# Patient Record
Sex: Male | Born: 1935 | Race: White | Hispanic: No | Marital: Single | Smoking: Never smoker
Health system: Southern US, Community
[De-identification: ages and names within clinical notes are randomized; demographics above are authoritative.]

## PROBLEM LIST (undated history)

## (undated) DIAGNOSIS — E039 Hypothyroidism, unspecified: Secondary | ICD-10-CM

## (undated) DIAGNOSIS — N400 Enlarged prostate without lower urinary tract symptoms: Secondary | ICD-10-CM

## (undated) DIAGNOSIS — G629 Polyneuropathy, unspecified: Secondary | ICD-10-CM

## (undated) DIAGNOSIS — J449 Chronic obstructive pulmonary disease, unspecified: Secondary | ICD-10-CM

## (undated) DIAGNOSIS — K449 Diaphragmatic hernia without obstruction or gangrene: Secondary | ICD-10-CM

## (undated) DIAGNOSIS — M1990 Unspecified osteoarthritis, unspecified site: Secondary | ICD-10-CM

## (undated) DIAGNOSIS — I509 Heart failure, unspecified: Secondary | ICD-10-CM

## (undated) DIAGNOSIS — R0602 Shortness of breath: Secondary | ICD-10-CM

## (undated) DIAGNOSIS — I10 Essential (primary) hypertension: Secondary | ICD-10-CM

## (undated) HISTORY — PX: REPAIR KNEE LIGAMENT: SUR1188

---

## 2010-05-24 ENCOUNTER — Observation Stay (HOSPITAL_COMMUNITY): Admission: EM | Admit: 2010-05-24 | Discharge: 2010-05-25 | Payer: Self-pay | Source: Home / Self Care

## 2010-06-03 LAB — CBC
HCT: 40.9 % (ref 39.0–52.0)
Hemoglobin: 12.7 g/dL — ABNORMAL LOW (ref 13.0–17.0)
MCH: 29.4 pg (ref 26.0–34.0)
MCHC: 31.1 g/dL (ref 30.0–36.0)
MCV: 94.7 fL (ref 78.0–100.0)
Platelets: 150 10*3/uL (ref 150–400)
RBC: 4.32 MIL/uL (ref 4.22–5.81)
RDW: 13 % (ref 11.5–15.5)
WBC: 10 10*3/uL (ref 4.0–10.5)

## 2010-06-03 LAB — PROTIME-INR
INR: 1.09 (ref 0.00–1.49)
Prothrombin Time: 14.3 seconds (ref 11.6–15.2)

## 2010-06-03 LAB — DIFFERENTIAL
Basophils Absolute: 0 10*3/uL (ref 0.0–0.1)
Basophils Relative: 0 % (ref 0–1)
Eosinophils Absolute: 0.3 10*3/uL (ref 0.0–0.7)
Eosinophils Relative: 3 % (ref 0–5)
Lymphocytes Relative: 13 % (ref 12–46)
Lymphs Abs: 1.3 10*3/uL (ref 0.7–4.0)
Monocytes Absolute: 0.9 10*3/uL (ref 0.1–1.0)
Monocytes Relative: 9 % (ref 3–12)
Neutro Abs: 7.5 10*3/uL (ref 1.7–7.7)
Neutrophils Relative %: 75 % (ref 43–77)

## 2010-06-03 LAB — POCT I-STAT, CHEM 8
BUN: 17 mg/dL (ref 6–23)
Calcium, Ion: 1.1 mmol/L — ABNORMAL LOW (ref 1.12–1.32)
Chloride: 100 mEq/L (ref 96–112)
Creatinine, Ser: 1.2 mg/dL (ref 0.4–1.5)
Glucose, Bld: 91 mg/dL (ref 70–99)
HCT: 44 % (ref 39.0–52.0)
Hemoglobin: 15 g/dL (ref 13.0–17.0)
Potassium: 4.1 mEq/L (ref 3.5–5.1)
Sodium: 138 mEq/L (ref 135–145)
TCO2: 33 mmol/L (ref 0–100)

## 2010-06-03 LAB — URINALYSIS, ROUTINE W REFLEX MICROSCOPIC
Bilirubin Urine: NEGATIVE
Hgb urine dipstick: NEGATIVE
Ketones, ur: NEGATIVE mg/dL
Nitrite: NEGATIVE
Protein, ur: NEGATIVE mg/dL
Specific Gravity, Urine: 1.012 (ref 1.005–1.030)
Urine Glucose, Fasting: NEGATIVE mg/dL
Urobilinogen, UA: 1 mg/dL (ref 0.0–1.0)
pH: 7 (ref 5.0–8.0)

## 2010-06-03 LAB — APTT: aPTT: 33 seconds (ref 24–37)

## 2010-06-03 LAB — DIGOXIN LEVEL: Digoxin Level: 0.6 ng/mL — ABNORMAL LOW (ref 0.8–2.0)

## 2010-06-11 NOTE — Discharge Summary (Signed)
[name], [name]                ACCOUNT NO.:  [id number]  MEDICAL RECORD NO.:  [id number]          PATIENT TYPE:  INP  LOCATION:  3013                         FACILITY:  [facility]  PHYSICIAN:  [name], M.D.DATE OF BIRTH:  [birth year]  DATE OF ADMISSION:  05/24/2010 DATE OF DISCHARGE:  05/25/2010                              DISCHARGE SUMMARY   DISCHARGE DIAGNOSES: 1. Fall. 2. Concussion. 3. C6 spinous process fracture of indeterminate age. 4. Hypertension. 5. Hypothyroidism. 6. 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 7. Atrial fibrillation. 8. Benign prostatic hypertrophy.  CONSULTANTS:  Dr. Franky Macho by telephone.  PROCEDURES:  None.  HISTORY OF PRESENT ILLNESS:  This is a 77 year old white male who attempted to go up some stairs alone which he is not supposed to do and when he got to the top fell backwards striking his head and back on the turf.  There was no loss of consciousness, although the patient was very confused at the scene.  He came in as a non-trauma code for evaluation and was still confused when he arrived at the emergency department. Workup showed a C6 spinous process fracture, although the central elements of that fracture appeared old to me.  By the time Trauma evaluated him, he was back to baseline from a mental status standpoint, but the emergency room physician felt strongly that he should come in and be observed and so he was admitted overnight for observation and a physical therapy evaluation the following morning.  HOSPITAL COURSE:  The patient did well overnight in the hospital.  He had no exacerbation of his concussive symptoms.  Physical Therapy found him to be at baseline with no needs and so he was able to be discharged home in good condition in care of his son.  DISCHARGE MEDICATIONS:  No new medications were added to the patient's regimen.  He may resume his home medications which include: 1.  Advil 200 mg 2-3 tablets by mouth daily at bedtime. 2. Alprazolam 0.5 mg tablets take 1-1/2 tablets by mouth every     evening. 3. Aspirin 81 mg daily. 4. Digoxin 0.125 mg daily. 5. Fish oil 1000 mg daily. 6. Furosemide 40 mg daily. 7. Gabapentin 600 mg tablets 1 in the morning and 1-1/2 tablets in the     evening. 8. Over-the-counter herbal laxative daily at bedtime. 9. Levothyroxine 50 mcg daily. 10.Multivitamin daily. 11.Paroxetine 40 mg daily at bedtime. 12.Potassium chloride 10 mEq daily. 13.Quinapril 20 mg daily. 14.Tamsulosin 0.4 mg daily as needed for prostatitis. 15.Taztia XT 120 mg daily. 16.Vitamin C 1000 mg daily. 17.Vitamin E 400 international units daily.  FOLLOWUP:  The patient will need to follow up with Dr. Franky Macho in his office and will call for an appointment.  Followup with the Trauma Service will be on an as-needed basis.     Earney Hamburg, P.A.   ______________________________ [name], M.D.    MJ/MEDQ  D:  05/25/2010  T:  05/25/2010  Job:  [id number]  cc:   Coletta Memos, M.D.  Electronically Signed by Charma Igo P.A. on 06/10/2010 10:56:06 AM Electronically  Signed by Violeta Gelinas M.D. on 06/11/2010 03:57:46 PM

## 2011-05-26 ENCOUNTER — Encounter (HOSPITAL_COMMUNITY): Payer: Self-pay

## 2011-05-26 ENCOUNTER — Inpatient Hospital Stay (HOSPITAL_COMMUNITY)
Admission: AD | Admit: 2011-05-26 | Discharge: 2011-05-30 | DRG: 190 | Disposition: A | Discharge status: Nursing Home (Includes ICF) | Payer: Medicare Other | Source: Ambulatory Visit | Attending: Internal Medicine | Admitting: Internal Medicine

## 2011-05-26 ENCOUNTER — Other Ambulatory Visit: Payer: Self-pay

## 2011-05-26 ENCOUNTER — Inpatient Hospital Stay (HOSPITAL_COMMUNITY): Payer: Medicare Other

## 2011-05-26 DIAGNOSIS — F0390 Unspecified dementia without behavioral disturbance: Secondary | ICD-10-CM | POA: Diagnosis present

## 2011-05-26 DIAGNOSIS — E039 Hypothyroidism, unspecified: Secondary | ICD-10-CM | POA: Diagnosis present

## 2011-05-26 DIAGNOSIS — I10 Essential (primary) hypertension: Secondary | ICD-10-CM | POA: Diagnosis present

## 2011-05-26 DIAGNOSIS — F341 Dysthymic disorder: Secondary | ICD-10-CM | POA: Diagnosis present

## 2011-05-26 DIAGNOSIS — J189 Pneumonia, unspecified organism: Secondary | ICD-10-CM | POA: Diagnosis present

## 2011-05-26 DIAGNOSIS — J441 Chronic obstructive pulmonary disease with (acute) exacerbation: Principal | ICD-10-CM | POA: Diagnosis present

## 2011-05-26 HISTORY — DX: Hypothyroidism, unspecified: E03.9

## 2011-05-26 HISTORY — DX: Shortness of breath: R06.02

## 2011-05-26 HISTORY — DX: Unspecified osteoarthritis, unspecified site: M19.90

## 2011-05-26 HISTORY — DX: Essential (primary) hypertension: I10

## 2011-05-26 HISTORY — DX: Chronic obstructive pulmonary disease, unspecified: J44.9

## 2011-05-26 LAB — COMPREHENSIVE METABOLIC PANEL
ALT: 22 U/L (ref 0–53)
AST: 20 U/L (ref 0–37)
Albumin: 3.3 g/dL — ABNORMAL LOW (ref 3.5–5.2)
Alkaline Phosphatase: 51 U/L (ref 39–117)
BUN: 35 mg/dL — ABNORMAL HIGH (ref 6–23)
CO2: 30 mEq/L (ref 19–32)
Calcium: 10 mg/dL (ref 8.4–10.5)
Chloride: 97 mEq/L (ref 96–112)
Creatinine, Ser: 1.11 mg/dL (ref 0.50–1.35)
GFR calc Af Amer: 65 mL/min — ABNORMAL LOW (ref >90)
GFR calc non Af Amer: 56 mL/min — ABNORMAL LOW (ref >90)
Glucose, Bld: 167 mg/dL — ABNORMAL HIGH (ref 70–99)
Potassium: 4.2 mEq/L (ref 3.5–5.1)
Sodium: 134 mEq/L — ABNORMAL LOW (ref 135–145)
Total Bilirubin: 0.9 mg/dL (ref 0.3–1.2)
Total Protein: 6.8 g/dL (ref 6.0–8.3)

## 2011-05-26 LAB — URINALYSIS, ROUTINE W REFLEX MICROSCOPIC
Bilirubin Urine: NEGATIVE
Glucose, UA: NEGATIVE mg/dL
Hgb urine dipstick: NEGATIVE
Ketones, ur: NEGATIVE mg/dL
Leukocytes, UA: NEGATIVE
Nitrite: NEGATIVE
Protein, ur: NEGATIVE mg/dL
Specific Gravity, Urine: 1.025 (ref 1.005–1.030)
Urobilinogen, UA: 0.2 mg/dL (ref 0.0–1.0)
pH: 5.5 (ref 5.0–8.0)

## 2011-05-26 LAB — CBC
HCT: 36.6 % — ABNORMAL LOW (ref 39.0–52.0)
Hemoglobin: 12.2 g/dL — ABNORMAL LOW (ref 13.0–17.0)
MCH: 30.8 pg (ref 26.0–34.0)
MCHC: 33.3 g/dL (ref 30.0–36.0)
MCV: 92.4 fL (ref 78.0–100.0)
Platelets: 158 10*3/uL (ref 150–400)
RBC: 3.96 MIL/uL — ABNORMAL LOW (ref 4.22–5.81)
RDW: 13.9 % (ref 11.5–15.5)
WBC: 15.4 10*3/uL — ABNORMAL HIGH (ref 4.0–10.5)

## 2011-05-26 LAB — TSH: TSH: 1.554 u[IU]/mL (ref 0.350–4.500)

## 2011-05-26 LAB — DIGOXIN LEVEL: Digoxin Level: 0.3 ng/mL — ABNORMAL LOW (ref 0.8–2.0)

## 2011-05-26 MED ORDER — DILTIAZEM HCL ER COATED BEADS 120 MG PO CP24
120.0000 mg | ORAL_CAPSULE | ORAL | Status: DC
  Administered 2011-05-27 – 2011-05-29 (×3): 120 mg via ORAL
  Filled 2011-05-26 (×5): qty 1

## 2011-05-26 MED ORDER — LEVOTHYROXINE SODIUM 50 MCG PO TABS
50.0000 ug | ORAL_TABLET | ORAL | Status: DC
  Administered 2011-05-27 – 2011-05-30 (×4): 50 ug via ORAL
  Filled 2011-05-26 (×4): qty 1

## 2011-05-26 MED ORDER — GABAPENTIN 300 MG PO CAPS
900.0000 mg | ORAL_CAPSULE | Freq: Two times a day (BID) | ORAL | Status: DC
  Administered 2011-05-26 – 2011-05-30 (×8): 900 mg via ORAL
  Filled 2011-05-26 (×10): qty 3

## 2011-05-26 MED ORDER — ALBUTEROL SULFATE (5 MG/ML) 0.5% IN NEBU
2.5000 mg | INHALATION_SOLUTION | RESPIRATORY_TRACT | Status: DC
  Administered 2011-05-26 – 2011-05-30 (×15): 2.5 mg via RESPIRATORY_TRACT
  Filled 2011-05-26 (×15): qty 0.5

## 2011-05-26 MED ORDER — SODIUM CHLORIDE 0.9 % IJ SOLN
INTRAMUSCULAR | Status: AC
  Administered 2011-05-26: 13:00:00
  Filled 2011-05-26: qty 3

## 2011-05-26 MED ORDER — IPRATROPIUM BROMIDE 0.02 % IN SOLN
0.5000 mg | RESPIRATORY_TRACT | Status: DC
  Administered 2011-05-26 – 2011-05-30 (×15): 0.5 mg via RESPIRATORY_TRACT
  Filled 2011-05-26 (×15): qty 2.5

## 2011-05-26 MED ORDER — OMEGA-3 FATTY ACIDS 1000 MG PO CAPS: 1.0000 g | ORAL_CAPSULE | ORAL | Status: DC

## 2011-05-26 MED ORDER — DEXTROSE 5 % IV SOLN
1.0000 g | INTRAVENOUS | Status: DC
  Administered 2011-05-26 – 2011-05-29 (×4): 1 g via INTRAVENOUS
  Filled 2011-05-26 (×8): qty 10

## 2011-05-26 MED ORDER — DIGOXIN 125 MCG PO TABS
125.0000 ug | ORAL_TABLET | ORAL | Status: DC
  Administered 2011-05-27 – 2011-05-30 (×4): 125 ug via ORAL
  Filled 2011-05-26 (×4): qty 1

## 2011-05-26 MED ORDER — AZITHROMYCIN 500 MG IV SOLR
500.0000 mg | INTRAVENOUS | Status: DC
  Administered 2011-05-26 – 2011-05-29 (×4): 500 mg via INTRAVENOUS
  Filled 2011-05-26 (×8): qty 500

## 2011-05-26 MED ORDER — ENOXAPARIN SODIUM 40 MG/0.4ML ~~LOC~~ SOLN
40.0000 mg | Freq: Every day | SUBCUTANEOUS | Status: DC
  Administered 2011-05-26 – 2011-05-29 (×4): 40 mg via SUBCUTANEOUS
  Filled 2011-05-26 (×4): qty 0.4

## 2011-05-26 MED ORDER — METHYLPREDNISOLONE SODIUM SUCC 125 MG IJ SOLR
125.0000 mg | Freq: Four times a day (QID) | INTRAMUSCULAR | Status: DC
  Administered 2011-05-26 – 2011-05-29 (×12): 125 mg via INTRAVENOUS
  Filled 2011-05-26 (×12): qty 2

## 2011-05-26 MED ORDER — POTASSIUM CHLORIDE CRYS ER 20 MEQ PO TBCR
20.0000 meq | EXTENDED_RELEASE_TABLET | Freq: Every day | ORAL | Status: DC
  Administered 2011-05-26 – 2011-05-30 (×5): 20 meq via ORAL
  Filled 2011-05-26 (×5): qty 1

## 2011-05-26 MED ORDER — FUROSEMIDE 40 MG PO TABS
40.0000 mg | ORAL_TABLET | ORAL | Status: DC
  Administered 2011-05-27 – 2011-05-30 (×4): 40 mg via ORAL
  Filled 2011-05-26 (×4): qty 1

## 2011-05-26 MED ORDER — SODIUM CHLORIDE 0.9 % IJ SOLN
3.0000 mL | Freq: Two times a day (BID) | INTRAMUSCULAR | Status: DC
  Administered 2011-05-26: 22:00:00 via INTRAVENOUS
  Administered 2011-05-27 – 2011-05-30 (×5): 3 mL via INTRAVENOUS
  Filled 2011-05-26 (×8): qty 3

## 2011-05-26 MED ORDER — LISINOPRIL 10 MG PO TABS
20.0000 mg | ORAL_TABLET | Freq: Every day | ORAL | Status: DC
  Administered 2011-05-27 – 2011-05-30 (×4): 20 mg via ORAL
  Filled 2011-05-26 (×4): qty 2

## 2011-05-26 MED ORDER — ALPRAZOLAM 0.5 MG PO TABS
0.5000 mg | ORAL_TABLET | Freq: Every day | ORAL | Status: DC
  Administered 2011-05-26: 0.5 mg via ORAL
  Filled 2011-05-26: qty 1

## 2011-05-26 MED ORDER — QUINAPRIL HCL 10 MG PO TABS
20.0000 mg | ORAL_TABLET | ORAL | Status: DC
  Filled 2011-05-26 (×2): qty 2

## 2011-05-26 MED ORDER — ACETAMINOPHEN 325 MG PO TABS
650.0000 mg | ORAL_TABLET | ORAL | Status: DC | PRN
  Administered 2011-05-26 – 2011-05-28 (×2): 650 mg via ORAL
  Filled 2011-05-26 (×2): qty 2

## 2011-05-27 MED ORDER — TUBERCULIN PPD 5 UNIT/0.1ML ID SOLN
5.0000 [IU] | Freq: Once | INTRADERMAL | Status: AC
  Administered 2011-05-27: 5 [IU] via INTRADERMAL
  Filled 2011-05-27: qty 0.1

## 2011-05-27 MED ORDER — BISACODYL 5 MG PO TBEC
10.0000 mg | DELAYED_RELEASE_TABLET | Freq: Once | ORAL | Status: AC
  Administered 2011-05-27: 10 mg via ORAL
  Filled 2011-05-27: qty 2

## 2011-05-27 MED ORDER — ALPRAZOLAM 1 MG PO TABS
1.0000 mg | ORAL_TABLET | Freq: Every day | ORAL | Status: DC
  Administered 2011-05-27 – 2011-05-29 (×3): 1 mg via ORAL
  Filled 2011-05-27 (×4): qty 1

## 2011-05-27 MED ORDER — SENNA 8.6 MG PO TABS
2.0000 | ORAL_TABLET | Freq: Every day | ORAL | Status: DC
  Administered 2011-05-27 – 2011-05-30 (×4): 17.2 mg via ORAL
  Filled 2011-05-27: qty 2
  Filled 2011-05-27 (×2): qty 1
  Filled 2011-05-27 (×2): qty 2

## 2011-05-27 MED ORDER — GUAIFENESIN ER 600 MG PO TB12
1200.0000 mg | ORAL_TABLET | Freq: Two times a day (BID) | ORAL | Status: DC
  Administered 2011-05-27 – 2011-05-30 (×7): 1200 mg via ORAL
  Filled 2011-05-27 (×7): qty 2

## 2011-05-27 MED ORDER — PAROXETINE HCL 20 MG PO TABS
40.0000 mg | ORAL_TABLET | Freq: Every day | ORAL | Status: DC
  Administered 2011-05-27 – 2011-05-30 (×4): 40 mg via ORAL
  Filled 2011-05-27 (×5): qty 2

## 2011-05-27 NOTE — Progress Notes (Signed)
[name], [name]                ACCOUNT NO.:  [id number]  MEDICAL RECORD NO.:  [id number]  LOCATION:  A210                          FACILITY:  [facility]  PHYSICIAN:  Swade Shonka D. [name], MD   DATE OF BIRTH:  12-Dec-1917  DATE OF PROCEDURE:  05/27/2011 DATE OF DISCHARGE:                                PROGRESS NOTE   SUBJECTIVE:  The patient feels slightly better.  He is still coughing and wheezing.  He is receiving his nebulizer, IV steroid and IV antibiotics.  OBJECTIVE:  GENERAL:  The patient is alert, awake, and sick looking. VITAL SIGNS:  Blood pressure 130/80, pulse 72, respiratory rate 16, temperature 98 degrees Fahrenheit. CHEST:  Decreased air entry, few rhonchi. CARDIOVASCULAR SYSTEM:  First and second heart sound heard.  No murmur. No gallop. ABDOMEN:  Soft and lax.  Bowel sound is positive.  No mass or organomegaly. EXTREMITIES:  No leg edema.  ASSESSMENT: 1. Community-acquired pneumonia. 2. Chronic obstructive pulmonary disease. 3. Dementia. 4. Hypertension. 5. Hypothyroidism. 6. Anxiety/depression disorder.  PLAN:  We will continue the patient on IV steroid.  Continue IV antibiotics.  Continue nebulizer treatment and current supportive care.     Siyona Coto D. [name], MD     TDF/MEDQ  D:  05/27/2011  T:  05/27/2011  Job:  [id number]

## 2011-05-27 NOTE — Progress Notes (Signed)
Physical Therapy Evaluation Patient Details Name: Khizar Fiorella MRN: [id number] DOB: [birth year] Today's Date: 05/27/2011  Problem List: There is no problem list on file for this patient.   Past Medical History:  Past Medical History  Diagnosis Date  . COPD (chronic obstructive pulmonary disease)   . Shortness of breath   . Asthma   . DEMENTIA     short term  . Hypertension   . Arthritis   . Hypothyroidism    Past Surgical History:  Past Surgical History  Procedure Date  . Repair knee ligament     PT Assessment/Plan/Recommendation PT Assessment Clinical Impression Statement: very pleasant gentleman who apears to be at functional baseline...is independent with transfers in/out of bed and able to ambulate 300' with walker and no instability...he does report some falls at home due to peripheral neuropathy and we could do HHPT at d/c, but he is not interested PT Recommendation/Assessment: Patent does not need any further PT services No Skilled PT: Patient at baseline level of functioning PT Recommendation Follow Up Recommendations: No PT follow up Equipment Recommended: None recommended by PT PT Goals     PT Evaluation Precautions/Restrictions  Precautions Precautions: Fall Required Braces or Orthoses: No Restrictions Weight Bearing Restrictions: No Prior Functioning  Home Living Lives With: Family Receives Help From: Family Type of Home: House Home Layout: One level Home Access: Level entry Home Adaptive Equipment: Straight cane;Walker - rolling Prior Function Level of Independence: Independent with basic ADLs;Independent with gait;Requires assistive device for independence;Independent with transfers Driving: No Vocation: Retired Producer, television/film/video: Awake/alert Overall Cognitive Status: Appears within functional limits for tasks assessed Orientation Level: Oriented X4 Sensation/Coordination Sensation Light Touch: Appears Intact (has LE  peripheral neuropathy) Stereognosis: Not tested Hot/Cold: Not tested Proprioception: Not tested Coordination Gross Motor Movements are Fluid and Coordinated: Yes Fine Motor Movements are Fluid and Coordinated: Not tested Extremity Assessment RLE Assessment RLE Assessment: Within Functional Limits LLE Assessment LLE Assessment: Within Functional Limits Mobility (including Balance) Bed Mobility Bed Mobility: Yes Supine to Sit: 6: Modified independent (Device/Increase time) Sitting - Scoot to Edge of Bed: 7: Independent Transfers Transfers: Yes Sit to Stand: 6: Modified independent (Device/Increase time) Stand to Sit: 6: Modified independent (Device/Increase time) Ambulation/Gait Ambulation/Gait: Yes Ambulation/Gait Assistance: 5: Supervision Ambulation/Gait Assistance Details (indicate cue type and reason): needs frequent v.c. for staying inside walker, safety with turns Ambulation Distance (Feet): 300 Feet Assistive device: Rolling walker Gait Pattern: Trunk flexed Stairs: No Wheelchair Mobility Wheelchair Mobility: No  Posture/Postural Control Posture/Postural Control: No significant limitations Balance Balance Assessed: No Exercise    End of Session PT - End of Session Equipment Utilized During Treatment: Gait belt Activity Tolerance: Patient tolerated treatment well Patient left: in chair;with family/visitor present Nurse Communication: Mobility status for transfers;Mobility status for ambulation General Behavior During Session: [facility] for tasks performed Cognition: [facility] for tasks performed  Konrad Penta 05/27/2011, 3:44 PM

## 2011-05-27 NOTE — Progress Notes (Signed)
UR Chart Review Completed  

## 2011-05-27 NOTE — H&P (Signed)
[name], [name]                ACCOUNT NO.:  [id number]  MEDICAL RECORD NO.:  [id number]  LOCATION:  A210                          FACILITY:  [facility]  PHYSICIAN:  Eldrick Penick D. [name], MD   DATE OF BIRTH:  [birth year]  DATE OF ADMISSION:  05/26/2011 DATE OF DISCHARGE:  LH                             HISTORY & PHYSICAL   CHIEF COMPLAINT:  Cough, congestion, and wheezing.  HISTORY OF PRESENT ILLNESS:  This is a 78 year old male patient with history of multiple medical illnesses came to the office with above complaints.  He was previously started on oral antibiotics and inhalers. The patient completed his antibiotics and continued to use his inhaler; however, he continued to have cough with yellowish sputum, severe congestion and wheezing.  His condition continued to deteriorate.  The patient was reevaluated in the office on May 26, 2011 and was admitted directly as a case of acute exacerbation of chronic obstructive pulmonary disease to rule out superimposed pneumonia.  REVIEW OF SYSTEMS:  The patient feels very weak and his appetite is poor.  He feels he has a low-grade fever and chills.  No nausea, vomiting, abdominal pain, dysuria, urgency, or frequency of urination.  PAST MEDICAL HISTORY: 1. Chronic obstructive pulmonary disease. 2. Hypertension. 3. Hypothyroidism. 4. Osteoarthritis. 5. Dementia. 6. Anxiety disorder.  HOME MEDICATIONS: 1. Atrovent and Proventil nebulizer q.4 hours. 2. Xanax 0.5 mg one and a half tablet at bedtime. 3. Aspirin 81 mg daily. 4. Calcium with vitamin D 1 tablet every morning. 5. Gabapentin 900 mg b.i.d. 6. Paxil 40 mg daily. 7. KCl 10 mEq p.o. daily. 8. Senna 3 tablets daily at bedtime. 9. Flomax 0.4 mg daily. 10.Lanoxin 0.125 mg daily. 11.Diltiazem 120 mg daily. 12.Lasix 40 mg daily. 13.Synthroid 50 mcg daily/ 14.Accupril 20 mg daily.  SOCIAL HISTORY:  The patient lives with his son.  No history of alcohol, tobacco, or substance  abuse.  FAMILY HISTORY:  Not available at this time.  PHYSICAL EXAMINATION:  GENERAL:  The patient is alert, awake, acutely sick looking. VITAL SIGNS:  Blood pressure 130/80, pulse 72, respiratory rate 16, temperature 98 degrees F.  HEENT:  Pupils are equal and reactive. NECK:  Supple. CHEST:  Bilateral expiratory wheezes and rhonchi. CARDIOVASCULAR SYSTEM:  First and second heart sounds heard.  No murmur. No gallop.  ABDOMEN:  Soft and lax.  Bowel sound is positive.  No mass or organomegaly. EXTREMITIES:  No leg edema.  LABS ON ADMISSION:  TSH 1.54.  Urinalysis is negative.  Dig level is 0.3.  BMP:  134, potassium 4.2, chloride 97, carbon dioxide 30, glucose 167, creatinine 1.1, calcium 10.0, total protein 6.8, albumin 3.3.  CBC: WBC 15.4, hemoglobin 12.2, hematocrit 36.6, and platelet 158.  Chest x- ray showed cardiomegaly and indistinct left lower lobe airspace opacity, probably pneumonia.  ASSESSMENT: 1. Acute exacerbation of chronic obstructive pulmonary disease. 2. Superimposed pneumonia. 3. Dementia. 4. Hypertension. 5. Hypothyroidism. 6. Dementia.  PLAN:  We will continue the patient on combination of IV antibiotics. We will continue nebulizer treatments.  We will do Pulmonary consult and we will continue regular medications.     [name] Depaul D. [name], MD  TDF/MEDQ  D:  05/27/2011  T:  05/27/2011  Job:  [id number]

## 2011-05-27 NOTE — Consult Note (Signed)
Consult requested by: Dr. [name] Consult requested for COPD:  HPI: This is a 78 year old who has cough and congestion this is been going on for several months. He's been coughing up some yellowish sputum. He has no other new complaints. He has not had any fever or chills. He says he's continued to use his inhalers but they have not worked.  Past Medical History  Diagnosis Date  . COPD (chronic obstructive pulmonary disease)   . Shortness of breath   . Asthma   . DEMENTIA     short term  . Hypertension   . Arthritis   . Hypothyroidism      History reviewed. No pertinent family history.   History   Social History  . Marital Status: Single    Spouse Name: N/A    Number of Children: N/A  . Years of Education: N/A   Social History Main Topics  . Smoking status: Never Smoker   . Smokeless tobacco: None  . Alcohol Use: No  . Drug Use:   . Sexually Active: No   Other Topics Concern  . None   Social History Narrative  . None     ROS: He denies any weight loss hemoptysis.    Objective: Vital signs in last 24 hours: Temp:  [98 F (36.7 C)-98.8 F (37.1 C)] 98 F (36.7 C) (01/08 0657) Pulse Rate:  [72-81] 75  (01/08 0657) Resp:  [16-20] 16  (01/08 0657) BP: (130-149)/(71-84) 144/84 mmHg (01/08 0657) SpO2:  [93 %-96 %] 94 % (01/08 0819) Weight:  [86.183 kg (190 lb)] 190 lb (86.183 kg) (01/07 1233) Weight change:  Last BM Date: 05/25/11  Intake/Output from previous day: 01/07 0701 - 01/08 0700 In: 963 [P.O.:660; I.V.:3; IV Piggyback:300] Out: 200 [Urine:200]  PHYSICAL EXAM He is awake and alert. He is mildly confused and has some difficulty with remembering things. His pupils are reactive. His nose and throat clear. His neck is supple without masses. His chest shows wheezes and rhonchi bilaterally. His heart is regular without gallop. His abdomen is soft without masses. His extremities showed no edema. Central nervous system examination shows the problems with  memory but otherwise no obvious changes  Lab Results: Basic Metabolic Panel:  Basename 05/26/11 1258  NA 134*  K 4.2  CL 97  CO2 30  GLUCOSE 167*  BUN 35*  CREATININE 1.11  CALCIUM 10.0  MG --  PHOS --   Liver Function Tests:  Basename 05/26/11 1258  AST 20  ALT 22  ALKPHOS 51  BILITOT 0.9  PROT 6.8  ALBUMIN 3.3*   No results found for this basename: LIPASE:2,AMYLASE:2 in the last 72 hours No results found for this basename: AMMONIA:2 in the last 72 hours CBC:  Basename 05/26/11 1258  WBC 15.4*  NEUTROABS --  HGB 12.2*  HCT 36.6*  MCV 92.4  PLT 158   Cardiac Enzymes: No results found for this basename: CKTOTAL:3,CKMB:3,CKMBINDEX:3,TROPONINI:3 in the last 72 hours BNP: No results found for this basename: PROBNP:3 in the last 72 hours D-Dimer: No results found for this basename: DDIMER:2 in the last 72 hours CBG: No results found for this basename: GLUCAP:6 in the last 72 hours Hemoglobin A1C: No results found for this basename: HGBA1C in the last 72 hours Fasting Lipid Panel: No results found for this basename: CHOL,HDL,LDLCALC,TRIG,CHOLHDL,LDLDIRECT in the last 72 hours Thyroid Function Tests:  Basename 05/26/11 1258  TSH 1.554  T4TOTAL --  FREET4 --  T3FREE --  THYROIDAB --   Anemia  Panel: No results found for this basename: VITAMINB12,FOLATE,FERRITIN,TIBC,IRON,RETICCTPCT in the last 72 hours Coagulation: No results found for this basename: LABPROT:2,INR:2 in the last 72 hours Urine Drug Screen: Drugs of Abuse  No results found for this basename: labopia, cocainscrnur, labbenz, amphetmu, thcu, labbarb    Alcohol Level: No results found for this basename: ETH:2 in the last 72 hours Urinalysis:  Misc. Labs:   ABGS: No results found for this basename: PHART,PCO2,PO2ART,TCO2,HCO3 in the last 72 hours   MICROBIOLOGY: No results found for this or any previous visit (from the past 240 hour(s)).  Studies/Results: Dg Chest 2 View  05/26/2011   *RADIOLOGY REPORT*  Clinical Data: Shortness breath.  COPD.  CHEST - 2 VIEW  Comparison: None.  Findings: The right costophrenic angle is excluded.  Cardiomegaly noted with atherosclerotic calcification in the aortic arch.  Indistinct airspace opacity is present in the left lower lobe.  There is band like atelectasis in the posted basal segment right lower lobe.  Atherosclerotic calcification of the aortic arch as noted along with aortic ectasia.  Thoracic spondylosis is present. Mild airway thickening noted.  IMPRESSION:  1.  Cardiomegaly and atherosclerosis. 2.  Indistinct left lower lobe airspace opacity, potentially from pneumonia or atelectasis. 3.  Scarring or atelectasis in the posterior basal segment right lower lobe. 4.  Mild airway thickening.  Original Report Authenticated By: Dellia Cloud, M.D.    Medications:  Scheduled:   . albuterol  2.5 mg Nebulization Q4H while awake   And  . ipratropium  0.5 mg Nebulization Q4H while awake  . ALPRAZolam  1 mg Oral QHS  . azithromycin  500 mg Intravenous Q24H  . cefTRIAXone (ROCEPHIN)  IV  1 g Intravenous Q24H  . digoxin  125 mcg Oral Q0700  . diltiazem  120 mg Oral Q0700  . enoxaparin (LOVENOX) injection  40 mg Subcutaneous Daily  . furosemide  40 mg Oral Q0700  . gabapentin  900 mg Oral BID  . levothyroxine  50 mcg Oral Q0700  . lisinopril  20 mg Oral Daily  . methylPREDNISolone (SOLU-MEDROL) injection  125 mg Intravenous Q6H  . PARoxetine  40 mg Oral Daily  . potassium chloride  20 mEq Oral Daily  . senna  2 tablet Oral Daily  . sodium chloride  3 mL Intravenous Q12H  . sodium chloride      . DISCONTD: ALPRAZolam  0.5 mg Oral QHS  . DISCONTD: fish oil-omega-3 fatty acids  1 g Oral Q0700  . DISCONTD: quinapril  20 mg Oral Q0700   Continuous:  ZOX:[id number]  Assesment: He has what appears to be COPD exacerbation with some pneumonia. He is on appropriate antibiotics. Active Problems:  * No active hospital problems. *      Plan: I left lower valve. Continue with other treatments. I'm going to see how he responds to current medications before I change anything else    LOS: 1 day   Michaelina Blandino L 05/27/2011, 8:47 AM

## 2011-05-28 MED ORDER — ACETYLCYSTEINE 20 % IN SOLN
3.0000 mL | RESPIRATORY_TRACT | Status: DC
Start: 2011-05-28 — End: 2011-05-29
  Administered 2011-05-28 – 2011-05-29 (×5): 3 mL via RESPIRATORY_TRACT
  Filled 2011-05-28 (×6): qty 4

## 2011-05-28 NOTE — Progress Notes (Signed)
Subjective: He seems a little bit better but he still pretty congested. He is more confused this morning.  Objective: Vital signs in last 24 hours: Temp:  [97.4 F (36.3 C)-98.2 F (36.8 C)] 98.2 F (36.8 C) (01/09 0700) Pulse Rate:  [75-80] 75  (01/09 0700) Resp:  [18] 18  (01/09 0700) BP: (90-154)/(53-82) 154/82 mmHg (01/09 0700) SpO2:  [93 %-97 %] 97 % (01/09 0716) Weight change:  Last BM Date: 05/25/11  Intake/Output from previous day: 01/08 0701 - 01/09 0700 In: 1300 [P.O.:1000; IV Piggyback:300] Out: -   PHYSICAL EXAM General appearance: alert, cooperative, mild distress and confused Resp: wheezes bilaterally Cardio: regular rate and rhythm, S1, S2 normal, no murmur, click, rub or gallop GI: soft, non-tender; bowel sounds normal; no masses,  no organomegaly Extremities: extremities normal, atraumatic, no cyanosis or edema  Lab Results:    Basic Metabolic Panel:  Basename 05/26/11 1258  NA 134*  K 4.2  CL 97  CO2 30  GLUCOSE 167*  BUN 35*  CREATININE 1.11  CALCIUM 10.0  MG --  PHOS --   Liver Function Tests:  Basename 05/26/11 1258  AST 20  ALT 22  ALKPHOS 51  BILITOT 0.9  PROT 6.8  ALBUMIN 3.3*   No results found for this basename: LIPASE:2,AMYLASE:2 in the last 72 hours No results found for this basename: AMMONIA:2 in the last 72 hours CBC:  Basename 05/26/11 1258  WBC 15.4*  NEUTROABS --  HGB 12.2*  HCT 36.6*  MCV 92.4  PLT 158   Cardiac Enzymes: No results found for this basename: CKTOTAL:3,CKMB:3,CKMBINDEX:3,TROPONINI:3 in the last 72 hours BNP: No results found for this basename: PROBNP:3 in the last 72 hours D-Dimer: No results found for this basename: DDIMER:2 in the last 72 hours CBG: No results found for this basename: GLUCAP:6 in the last 72 hours Hemoglobin A1C: No results found for this basename: HGBA1C in the last 72 hours Fasting Lipid Panel: No results found for this basename: CHOL,HDL,LDLCALC,TRIG,CHOLHDL,LDLDIRECT  in the last 72 hours Thyroid Function Tests:  Basename 05/26/11 1258  TSH 1.554  T4TOTAL --  FREET4 --  T3FREE --  THYROIDAB --   Anemia Panel: No results found for this basename: VITAMINB12,FOLATE,FERRITIN,TIBC,IRON,RETICCTPCT in the last 72 hours Coagulation: No results found for this basename: LABPROT:2,INR:2 in the last 72 hours Urine Drug Screen: Drugs of Abuse  No results found for this basename: labopia, cocainscrnur, labbenz, amphetmu, thcu, labbarb    Alcohol Level: No results found for this basename: ETH:2 in the last 72 hours Urinalysis:  Misc. Labs:  ABGS No results found for this basename: PHART,PCO2,PO2ART,TCO2,HCO3 in the last 72 hours CULTURES No results found for this or any previous visit (from the past 240 hour(s)). Studies/Results: Dg Chest 2 View  05/26/2011  *RADIOLOGY REPORT*  Clinical Data: Shortness breath.  COPD.  CHEST - 2 VIEW  Comparison: None.  Findings: The right costophrenic angle is excluded.  Cardiomegaly noted with atherosclerotic calcification in the aortic arch.  Indistinct airspace opacity is present in the left lower lobe.  There is band like atelectasis in the posted basal segment right lower lobe.  Atherosclerotic calcification of the aortic arch as noted along with aortic ectasia.  Thoracic spondylosis is present. Mild airway thickening noted.  IMPRESSION:  1.  Cardiomegaly and atherosclerosis. 2.  Indistinct left lower lobe airspace opacity, potentially from pneumonia or atelectasis. 3.  Scarring or atelectasis in the posterior basal segment right lower lobe. 4.  Mild airway thickening.  Original Report Authenticated  By: Dellia Cloud, M.D.    Medications:  Prior to Admission:  Prescriptions prior to admission  Medication Sig Dispense Refill  . albuterol (PROVENTIL HFA;VENTOLIN HFA) 108 (90 BASE) MCG/ACT inhaler Inhale 2 puffs into the lungs every 6 (six) hours as needed. For shortness of breath       . albuterol (PROVENTIL) (2.5  MG/3ML) 0.083% nebulizer solution Take 2.5 mg by nebulization every 6 (six) hours as needed. For shortness of breath       . ALPRAZolam (XANAX) 0.5 MG tablet Take 0.75 mg by mouth at bedtime. Take 1 1/2 tablets by mouth at bedtime      . aspirin EC 81 MG tablet Take 81 mg by mouth every morning.        . calcium-vitamin D (OSCAL WITH D) 500-200 MG-UNIT per tablet Take 1 tablet by mouth every morning.        . digoxin (LANOXIN) 0.125 MG tablet Take 125 mcg by mouth every morning.        . diltiazem (TAZTIA XT) 120 MG 24 hr capsule Take 120 mg by mouth every morning.        . fish oil-omega-3 fatty acids 1000 MG capsule Take 1 g by mouth every morning.        . furosemide (LASIX) 40 MG tablet Take 40 mg by mouth every morning.        . gabapentin (NEURONTIN) 600 MG tablet Take 600 mg by mouth 4 (four) times daily as needed. For nerve pain      . glucosamine-chondroitin 500-400 MG tablet Take 1 tablet by mouth every morning.        . Ibuprofen-Diphenhydramine HCl (ADVIL PM) 200-25 MG CAPS Take 2-3 tablets by mouth at bedtime. For pain and sleep       . Lecithin 1000 MG CHEW Chew 1 tablet by mouth every morning.        Marland Kitchen levothyroxine (SYNTHROID, LEVOTHROID) 50 MCG tablet Take 50 mcg by mouth every morning.        Marland Kitchen PARoxetine (PAXIL) 40 MG tablet Take 40 mg by mouth at bedtime.        . potassium chloride (K-DUR) 10 MEQ tablet Take 10 mEq by mouth every morning.        . quinapril (ACCUPRIL) 20 MG tablet Take 20 mg by mouth every morning.        . Saw Palmetto 500 MG CAPS Take 1 capsule by mouth every morning.        . senna (SENOKOT) 8.6 MG TABS Take 3 tablets by mouth at bedtime.        . Tamsulosin HCl (FLOMAX) 0.4 MG CAPS Take 0.4 mg by mouth at bedtime as needed. For bladder control       . vitamin C (ASCORBIC ACID) 500 MG tablet Take 1,000 mg by mouth every morning.        . vitamin E (VITAMIN E) 400 UNIT capsule Take 400 Units by mouth every morning.        Scheduled:   . albuterol  2.5  mg Nebulization Q4H while awake   And  . ipratropium  0.5 mg Nebulization Q4H while awake  . ALPRAZolam  1 mg Oral QHS  . azithromycin  500 mg Intravenous Q24H  . bisacodyl  10 mg Oral Once  . cefTRIAXone (ROCEPHIN)  IV  1 g Intravenous Q24H  . digoxin  125 mcg Oral Q0700  . diltiazem  120 mg Oral Q0700  . enoxaparin (  LOVENOX) injection  40 mg Subcutaneous Daily  . furosemide  40 mg Oral Q0700  . gabapentin  900 mg Oral BID  . guaiFENesin  1,200 mg Oral BID  . levothyroxine  50 mcg Oral Q0700  . lisinopril  20 mg Oral Daily  . methylPREDNISolone (SOLU-MEDROL) injection  125 mg Intravenous Q6H  . PARoxetine  40 mg Oral Daily  . potassium chloride  20 mEq Oral Daily  . senna  2 tablet Oral Daily  . sodium chloride  3 mL Intravenous Q12H  . tuberculin  5 Units Intradermal Once   Continuous:  [id number]:[id number]  Assesment: He has pneumonia and COPD exacerbation. He seems to be a little bit better but is still very congested and having trouble coughing it up. He is more confused today. Active Problems:  * No active hospital problems. *     Plan: I'm going to try him on some Mucomyst to see if it will make a difference in his sputum production    LOS: 2 days   [name] L 05/28/2011, 8:47 AM

## 2011-05-28 NOTE — Progress Notes (Signed)
Pt evaluated by PT and ambulated 300'.  He was also assessed by [facility] and is appropriate for their facility.  Son is in agreement with plan.  TB skin test administered yesterday and will be read tomorrow.  Awaiting stability for d/c.  Karn Cassis

## 2011-05-28 NOTE — Progress Notes (Signed)
[name], [name]                ACCOUNT NO.:  [id number]  MEDICAL RECORD NO.:  [id number]  LOCATION:  A313                          FACILITY:  [facility]  PHYSICIAN:  Kalen Ratajczak D. [name], MD   DATE OF BIRTH:  25-Jul-1917  DATE OF PROCEDURE:  05/27/2011 DATE OF DISCHARGE:                                PROGRESS NOTE   SUBJECTIVE:  The patient feels slightly better.  However, he continued to cough and wheeze.  OBJECTIVE:  GENERAL/VITAL SIGNS:  The patient is more alert, awake, with vitals of blood pressure 143/66, pulse 80, respiratory rate 18, temperature 97.7 degrees Fahrenheit. CHEST:  Decreased air entry, bilateral expiratory wheezes and rhonchi. CARDIOVASCULAR SYSTEM:  First and second heart sounds heard.  No murmur, no gallop. ABDOMEN:  Soft, and lax.  Bowel sound is positive.  No mass or organomegaly. EXTREMITIES:  No leg edema.  ASSESSMENT: 1. Community-acquired pneumonia. 2. Chronic obstructive pulmonary disease. 3. History of hypertension. 4. Hypothyroidism. 5. Dementia.  PLAN:  Continue patient on IV antibiotics.  Continue IV steroid. Continue nebulizer treatment.  Pulmonary consult is appreciated.     Elzada Pytel D. [name], MD     TDF/MEDQ  D:  05/28/2011  T:  05/28/2011  Job:  [id number]

## 2011-05-29 MED ORDER — SODIUM CHLORIDE 0.9 % IJ SOLN
INTRAMUSCULAR | Status: AC
  Administered 2011-05-29: 3 mL
  Filled 2011-05-29: qty 3

## 2011-05-29 MED ORDER — ACETYLCYSTEINE 20 % IN SOLN
3.0000 mL | RESPIRATORY_TRACT | Status: DC
  Administered 2011-05-29: 19:00:00 via RESPIRATORY_TRACT
  Administered 2011-05-30 (×2): 3 mL via RESPIRATORY_TRACT
  Filled 2011-05-29 (×3): qty 4

## 2011-05-29 MED ORDER — SODIUM CHLORIDE 0.9 % IJ SOLN
INTRAMUSCULAR | Status: AC
  Filled 2011-05-29: qty 3

## 2011-05-29 MED ORDER — METHYLPREDNISOLONE SODIUM SUCC 125 MG IJ SOLR
60.0000 mg | Freq: Four times a day (QID) | INTRAMUSCULAR | Status: DC
  Administered 2011-05-29 – 2011-05-30 (×4): 60 mg via INTRAVENOUS
  Filled 2011-05-29 (×4): qty 2

## 2011-05-29 NOTE — Progress Notes (Signed)
TB test read, negative, 0mm iduration, no swelling.

## 2011-05-29 NOTE — Progress Notes (Signed)
NAMERUNE, MENDEZ                ACCOUNT NO.:  [id number]  MEDICAL RECORD NO.:  [id number]  LOCATION:  A313                          FACILITY:  [facility]  PHYSICIAN:  Bryler Dibble L. Juanetta Gosling, M.D.DATE OF BIRTH:  [birth year]  DATE OF PROCEDURE: DATE OF DISCHARGE:                                PROGRESS NOTE   Patient of Dr. [name].  Mr. Windt has improved some I think.  He says he is still having shortness of breath, but he is still also somewhat confused so it is a little bit difficult to be certain about his history.  However, he seems to have less congestion.  He is still coughing.  He has some reflux problems.  His physical examination shows his chest is clearer than before.  He does have some rhonchi.  His heart is regular.  My assessment is that he has chronic obstructive pulmonary disease exacerbation.  He also has pneumonia.  He seems to be improving.  He has dementia which I think is unchanged.  I do not think there is really anything else to do at this point.  He said to continue with his treatments.  He does seem to be improving.     Jaszmine Navejas L. Juanetta Gosling, M.D.     ELH/MEDQ  D:  05/29/2011  T:  05/29/2011  Job:  [id number]

## 2011-05-29 NOTE — Progress Notes (Signed)
[name], [name]                ACCOUNT NO.:  [id number]  MEDICAL RECORD NO.:  [id number]  LOCATION:  A313                          FACILITY:  [facility]  PHYSICIAN:  Fermon Ureta D. [name], MD   DATE OF BIRTH:  05-24-1917  DATE OF PROCEDURE:  05/29/2011 DATE OF DISCHARGE:                                PROGRESS NOTE   SUBJECTIVE:  The patient feels better.  His shortness of breath and wheezing is improving.  No fever or chills.  OBJECTIVE:  GENERAL/VITAL SIGNS:  The patient is more alert, awake, but sick-looking with vitals, blood pressure 92/67, pulse 68, respiratory rate 18, temperature 97 degrees Fahrenheit. CHEST:  Decreased air entry, few rhonchi. CARDIOVASCULAR SYSTEM:  First and second heart sounds heard.  No murmur. No gallop. ABDOMEN:  Soft and lax.  Bowel sound is positive.  No mass or organomegaly. EXTREMITIES:  No leg edema.  ASSESSMENT: 1. Community-acquired pneumonia. 2. Acute exacerbation of chronic obstructive pulmonary disease. 3. History of hypertension. 4. Hypothyroidism. 5. Deconditioning.  PLAN:  We will continue the patient on IV antibiotics.  We will continue tapering his IV steroid.  Continue supportive care.     Tekeya Geffert D. [name], MD     TDF/MEDQ  D:  05/29/2011  T:  05/29/2011  Job:  [id number]

## 2011-05-30 LAB — CBC
HCT: 35.8 % — ABNORMAL LOW (ref 39.0–52.0)
Hemoglobin: 12 g/dL — ABNORMAL LOW (ref 13.0–17.0)
RDW: 13.9 % (ref 11.5–15.5)
WBC: 12.4 10*3/uL — ABNORMAL HIGH (ref 4.0–10.5)

## 2011-05-30 LAB — BASIC METABOLIC PANEL
BUN: 36 mg/dL — ABNORMAL HIGH (ref 6–23)
Chloride: 97 mEq/L (ref 96–112)
GFR calc Af Amer: 66 mL/min — ABNORMAL LOW (ref >90)
Glucose, Bld: 276 mg/dL — ABNORMAL HIGH (ref 70–99)
Potassium: 4.2 mEq/L (ref 3.5–5.1)
Sodium: 133 mEq/L — ABNORMAL LOW (ref 135–145)

## 2011-05-30 MED ORDER — PREDNISONE (PAK) 10 MG PO TABS: 10.0000 mg | ORAL_TABLET | Freq: Every day | ORAL | Status: AC

## 2011-05-30 MED ORDER — MOXIFLOXACIN HCL 400 MG PO TABS: 400.0000 mg | ORAL_TABLET | Freq: Every day | ORAL | Status: AC

## 2011-05-30 NOTE — Discharge Summary (Signed)
NAMEWATT, GEILER                ACCOUNT NO.:  [id number]  MEDICAL RECORD NO.:  [id number]  LOCATION:  A313                          FACILITY:  [facility]  PHYSICIAN:  Sanjiv Castorena D. [name], MD   DATE OF BIRTH:  1917/06/12  DATE OF ADMISSION:  05/26/2011 DATE OF DISCHARGE:  01/11/2013LH                              DISCHARGE SUMMARY   DISCHARGE DIAGNOSES: 1. Acute exacerbation of chronic obstructive pulmonary disease. 2. Pneumonia. 3. Dementia. 4. Hypertension. 5. Hypothyroidism. 6. Deconditioning.  DISCHARGE MEDICATIONS: 1. Avelox 400 mg p.o. daily for 5 days. 2. Prednisolone dose pack. 3. Advil 2-3 tablets p.o. daily at bedtime p.r.n. 4. Atrovent and Proventil nebulizer q.4 hours p.r.n. 5. Xanax 0.75 mg p.o. at bedtime. 6. Aspirin 81 mg daily. 7. Calcium with vitamin D 1 tablet daily. 8. Digoxin 0.125 mg daily. 9. Fish oil daily. 10.Lasix 40 mg daily. 11.Gabapentin 600 mg 4 times a day.  12.  Levothyroxine 50 mcg daily. 12.Paxil 40 mg daily. 13.Potassium 10 mEq daily. 14.Accupril 20 mg daily. 15.Flomax 0.4 mg daily. 16.Taztia XT 120 mg daily. 17.Vitamin C 500 mg 2 tablets daily. 18.Vitamin E 400 units daily.  DISPOSITION:  The patient will be placed in a [facility] for physical therapy and to assist him in his daily living activity.  DISCHARGE INSTRUCTIONS:  The patient will continue on oral antibiotics and a steroid dose pack.  He will be followed in the office.  HOSPITAL COURSE:  This is a 78 year old male patient with history of multiple medical illnesses who was admitted from the office due to recurrent cough, wheezing, and shortness of breath.  He was started on IV steroid and IV antibiotics.  Pulmonary consult was done.  Over the hospital stay, the patient gradually improved.  The patient is currently deconditioned and they he is requiring assistance on his daily living activity.  The patient is going to be placed at [facility] to continue  his medications and assist with his daily living activity.     Onesha Krebbs D. [name], MD     TDF/MEDQ  D:  05/30/2011  T:  05/30/2011  Job:  [id number]

## 2011-05-30 NOTE — Progress Notes (Signed)
Pt to D/C today to [facility] in [place].  CSW spoke with Pt and with staff at facility.  Both are in agreement with D/C plan.  Pt to be transported by his son.  CSW will sign off at this time.

## 2011-05-30 NOTE — Progress Notes (Signed)
CARE MANAGEMENT NOTE 05/30/2011  Patient:  [name],[name]   Account Number:  [id number]  Date Initiated:  05/27/2011  Documentation initiated by:  Anibal Henderson  Subjective/Objective Assessment:   Admitted with COPD exacerbation with superimposed PNA, failed OP treatment. Pt lives with his son and daughter-in law, [name] and [name], but is hoping to move to [name]     Action/Plan:   Referred to CSW   Anticipated DC Date:  05/29/2011   Anticipated DC Plan:  ASSISTED LIVING / REST HOME  In-house referral  Clinical Social Worker      DC Planning Services  CM consult      Choice offered to / List presented to:             Status of service:  Completed, signed off Medicare Important Message given?   (If response is "NO", the following Medicare IM given date fields will be blank) Date Medicare IM given:   Date Additional Medicare IM given:    Discharge Disposition:  SKILLED NURSING FACILITY  Per UR Regulation:  Reviewed for med. necessity/level of care/duration of stay  Comments:  05/30/11 1400 [name] Kistler RN BSN CSW arranged placement at [name]  05/27/11 [address] RN

## 2011-05-30 NOTE — Progress Notes (Signed)
Subjective: I think he has improved. He seems to be less congested and overall better.  Objective: Vital signs in last 24 hours: Temp:  [96.8 F (36 C)-97.5 F (36.4 C)] 96.9 F (36.1 C) (01/11 0515) Pulse Rate:  [67-86] 72  (01/11 0515) Resp:  [18-20] 20  (01/11 0515) BP: (91-130)/(57-69) 95/64 mmHg (01/11 0515) SpO2:  [93 %-98 %] 95 % (01/11 0704) Weight change:  Last BM Date: 05/25/11  Intake/Output from previous day: 01/10 0701 - 01/11 0700 In: 900 [P.O.:600; IV Piggyback:300] Out: 800 [Urine:800]  PHYSICAL EXAM General appearance: alert, cooperative and no distress Resp: rhonchi bilaterally Cardio: regular rate and rhythm, S1, S2 normal, no murmur, click, rub or gallop GI: soft, non-tender; bowel sounds normal; no masses,  no organomegaly Extremities: extremities normal, atraumatic, no cyanosis or edema  Lab Results:    Basic Metabolic Panel:  Basename 05/30/11 0524  NA 133*  K 4.2  CL 97  CO2 29  GLUCOSE 276*  BUN 36*  CREATININE 1.09  CALCIUM 9.3  MG --  PHOS --   Liver Function Tests: No results found for this basename: AST:2,ALT:2,ALKPHOS:2,BILITOT:2,PROT:2,ALBUMIN:2 in the last 72 hours No results found for this basename: LIPASE:2,AMYLASE:2 in the last 72 hours No results found for this basename: AMMONIA:2 in the last 72 hours CBC:  Basename 05/30/11 0524  WBC 12.4*  NEUTROABS --  HGB 12.0*  HCT 35.8*  MCV 91.1  PLT 193   Cardiac Enzymes: No results found for this basename: CKTOTAL:3,CKMB:3,CKMBINDEX:3,TROPONINI:3 in the last 72 hours BNP: No results found for this basename: PROBNP:3 in the last 72 hours D-Dimer: No results found for this basename: DDIMER:2 in the last 72 hours CBG: No results found for this basename: GLUCAP:6 in the last 72 hours Hemoglobin A1C: No results found for this basename: HGBA1C in the last 72 hours Fasting Lipid Panel: No results found for this basename: CHOL,HDL,LDLCALC,TRIG,CHOLHDL,LDLDIRECT in the last 72  hours Thyroid Function Tests: No results found for this basename: TSH,T4TOTAL,FREET4,T3FREE,THYROIDAB in the last 72 hours Anemia Panel: No results found for this basename: VITAMINB12,FOLATE,FERRITIN,TIBC,IRON,RETICCTPCT in the last 72 hours Coagulation: No results found for this basename: LABPROT:2,INR:2 in the last 72 hours Urine Drug Screen: Drugs of Abuse  No results found for this basename: labopia, cocainscrnur, labbenz, amphetmu, thcu, labbarb    Alcohol Level: No results found for this basename: ETH:2 in the last 72 hours Urinalysis:  Misc. Labs:  ABGS No results found for this basename: PHART,PCO2,PO2ART,TCO2,HCO3 in the last 72 hours CULTURES No results found for this or any previous visit (from the past 240 hour(s)). Studies/Results: No results found.  Medications:  Scheduled:   . acetylcysteine  3 mL Nebulization Q4H WA  . albuterol  2.5 mg Nebulization Q4H while awake   And  . ipratropium  0.5 mg Nebulization Q4H while awake  . ALPRAZolam  1 mg Oral QHS  . azithromycin  500 mg Intravenous Q24H  . cefTRIAXone (ROCEPHIN)  IV  1 g Intravenous Q24H  . digoxin  125 mcg Oral Q0700  . diltiazem  120 mg Oral Q0700  . enoxaparin (LOVENOX) injection  40 mg Subcutaneous Daily  . furosemide  40 mg Oral Q0700  . gabapentin  900 mg Oral BID  . guaiFENesin  1,200 mg Oral BID  . levothyroxine  50 mcg Oral Q0700  . lisinopril  20 mg Oral Daily  . methylPREDNISolone (SOLU-MEDROL) injection  60 mg Intravenous Q6H  . PARoxetine  40 mg Oral Daily  . potassium chloride  20  mEq Oral Daily  . senna  2 tablet Oral Daily  . sodium chloride  3 mL Intravenous Q12H  . sodium chloride      . sodium chloride      . DISCONTD: acetylcysteine  3 mL Nebulization Q4H   Continuous:  [id number]  Assesment: He has COPD exacerbation with pneumonia. He has improved. He is going to be transferred to an assisted living facility. Active Problems:  * No active hospital problems. *      Plan: Transfer to assisted living facility. I have discussed this with Dr. [name] and I agree that he is ready. I will be glad to see him as an outpatient if needed.  Thanks for allow me to see him with you    LOS: 4 days   [name] L 05/30/2011, 8:28 AM

## 2011-05-30 NOTE — Progress Notes (Signed)
Patient d/c to [facility] [place], transported by son Discharge paperwork/packet sent by son Left floor via wheelchair No c/o pain at d/c [employer] [employer]

## 2011-07-16 ENCOUNTER — Emergency Department (HOSPITAL_COMMUNITY)
Admission: EM | Admit: 2011-07-16 | Discharge: 2011-07-16 | Disposition: A | Discharge status: Other Acute Inpatient Hospital | Payer: Medicare Other | Attending: Emergency Medicine | Admitting: Emergency Medicine

## 2011-07-16 ENCOUNTER — Emergency Department (HOSPITAL_COMMUNITY): Payer: Medicare Other

## 2011-07-16 ENCOUNTER — Encounter (HOSPITAL_COMMUNITY): Payer: Self-pay | Admitting: Emergency Medicine

## 2011-07-16 DIAGNOSIS — I10 Essential (primary) hypertension: Secondary | ICD-10-CM | POA: Insufficient documentation

## 2011-07-16 DIAGNOSIS — M25519 Pain in unspecified shoulder: Secondary | ICD-10-CM | POA: Insufficient documentation

## 2011-07-16 DIAGNOSIS — R404 Transient alteration of awareness: Secondary | ICD-10-CM | POA: Insufficient documentation

## 2011-07-16 DIAGNOSIS — W19XXXA Unspecified fall, initial encounter: Secondary | ICD-10-CM

## 2011-07-16 DIAGNOSIS — R51 Headache: Secondary | ICD-10-CM | POA: Insufficient documentation

## 2011-07-16 DIAGNOSIS — J4489 Other specified chronic obstructive pulmonary disease: Secondary | ICD-10-CM | POA: Insufficient documentation

## 2011-07-16 DIAGNOSIS — S40019A Contusion of unspecified shoulder, initial encounter: Secondary | ICD-10-CM

## 2011-07-16 DIAGNOSIS — J449 Chronic obstructive pulmonary disease, unspecified: Secondary | ICD-10-CM | POA: Insufficient documentation

## 2011-07-16 DIAGNOSIS — Z7982 Long term (current) use of aspirin: Secondary | ICD-10-CM | POA: Insufficient documentation

## 2011-07-16 DIAGNOSIS — M542 Cervicalgia: Secondary | ICD-10-CM | POA: Insufficient documentation

## 2011-07-16 DIAGNOSIS — S0990XA Unspecified injury of head, initial encounter: Secondary | ICD-10-CM

## 2011-07-16 DIAGNOSIS — M129 Arthropathy, unspecified: Secondary | ICD-10-CM | POA: Insufficient documentation

## 2011-07-16 DIAGNOSIS — Y9210 Unspecified residential institution as the place of occurrence of the external cause: Secondary | ICD-10-CM | POA: Insufficient documentation

## 2011-07-16 DIAGNOSIS — G8929 Other chronic pain: Secondary | ICD-10-CM | POA: Insufficient documentation

## 2011-07-16 DIAGNOSIS — E039 Hypothyroidism, unspecified: Secondary | ICD-10-CM | POA: Insufficient documentation

## 2011-07-16 NOTE — ED Provider Notes (Signed)
This chart was scribed for [employer]. [name] Payor, MD by [name]. The patient was seen in room APA01/APA01 at 10:29 PM.  CSN: [id number]  Arrival date & time 07/16/11  2222   First MD Initiated Contact with Patient 07/16/11 2228      Chief Complaint  Patient presents with  . Fall    (Consider location/radiation/quality/duration/timing/severity/associated sxs/prior treatment) Patient is a 78 y.o. male presenting with fall. The history is provided by the patient.  Fall The accident occurred less than 1 hour ago. The fall occurred while walking. He landed on a hard floor. There was no blood loss. The point of impact was the head. The pain is present in the left shoulder. There was no entrapment after the fall. Associated symptoms include loss of consciousness. Treatment on scene includes a c-collar and a backboard. He has tried nothing for the symptoms.   [name] is a 78 y.o. male who presents to the Emergency Department complaining of a sudden fall less than an hour ago. Pt arrived via EMS. Pt was reaching out for walker and lost footing. He hit his head on nightstand and lost consciousness. EMS put in c-collar and backboard on scene. Past Medical History  Diagnosis Date  . COPD (chronic obstructive pulmonary disease)   . Shortness of breath   . Asthma   . DEMENTIA     short term  . Hypertension   . Arthritis   . Hypothyroidism     Past Surgical History  Procedure Date  . Repair knee ligament     No family history on file.  History  Substance Use Topics  . Smoking status: Never Smoker   . Smokeless tobacco: Not on file  . Alcohol Use: No      Review of Systems  Neurological: Positive for loss of consciousness.  All other systems reviewed and are negative.    Allergies  Penicillins and Sulfa antibiotics  Home Medications   Current Outpatient Rx  Name Route Sig Dispense Refill  . ALBUTEROL SULFATE HFA 108 (90 BASE) MCG/ACT IN AERS Inhalation Inhale 2  puffs into the lungs every 6 (six) hours as needed. WHEEZING    . ALPRAZOLAM 0.5 MG PO TABS Oral Take 0.75 mg by mouth at bedtime. Take 1 &1/2 tablets by mouth at bedtime    . ASPIRIN EC 81 MG PO TBEC Oral Take 81 mg by mouth every morning.      Marland Kitchen DIGOXIN 0.125 MG PO TABS Oral Take 125 mcg by mouth every morning.      Marland Kitchen DILTIAZEM HCL ER BEADS 120 MG PO CP24 Oral Take 120 mg by mouth every morning.      Marland Kitchen OMEGA-3 FATTY ACIDS 1000 MG PO CAPS Oral Take 1 g by mouth every morning.      . FUROSEMIDE 40 MG PO TABS Oral Take 40 mg by mouth every morning.      Marland Kitchen GABAPENTIN 600 MG PO TABS Oral Take 600 mg by mouth 4 (four) times daily as needed. For nerve pain    . IBUPROFEN-DIPHENHYDRAMINE HCL 200-25 MG PO CAPS Oral Take 2 tablets by mouth at bedtime. For pain and sleep    . LECITHIN 1200 MG PO CAPS Oral Take 1,200 mg by mouth daily.    Marland Kitchen LEVOTHYROXINE SODIUM 50 MCG PO TABS Oral Take 50 mcg by mouth every morning.     Marland Kitchen PAROXETINE HCL 40 MG PO TABS Oral Take 40 mg by mouth at bedtime.      Marland Kitchen  POLYETHYLENE GLYCOL 3350 PO POWD Oral Take 17 g by mouth daily. **MIX ONE CAPFUL IN 8 OUNCES OF WATER/JUICE THEN DRINK DAILY**    . POTASSIUM CHLORIDE ER 10 MEQ PO TBCR Oral Take 10 mEq by mouth every morning.      . QUINAPRIL HCL 20 MG PO TABS Oral Take 20 mg by mouth every morning.      . SENNA 8.6 MG PO TABS Oral Take 3 tablets by mouth at bedtime.      Marland Kitchen VITAMIN C 500 MG PO TABS Oral Take 1,000 mg by mouth every morning.     Marland Kitchen VITAMIN E 400 UNITS PO CAPS Oral Take 400 Units by mouth every morning.     . ALBUTEROL SULFATE (2.5 MG/3ML) 0.083% IN NEBU Nebulization Take 2.5 mg by nebulization every 6 (six) hours as needed. For shortness of breath       There were no vitals taken for this visit.  Physical Exam  Nursing note and vitals reviewed. Constitutional: He is oriented to person, place, and time. He appears well-developed and well-nourished.  HENT:  Head: Normocephalic and atraumatic.  Neck: Normal range  of motion. Neck supple.  Cardiovascular: Normal rate, regular rhythm and normal heart sounds.   Pulmonary/Chest: Effort normal and breath sounds normal.  Abdominal: Soft. Bowel sounds are normal.       Tenderness to left shoulder  No crepitus No deformity Dry blood above left lip No CVA tenderness No spinal tenderness  Neurological: He is alert and oriented to person, place, and time.  Skin: Skin is warm and dry.  Psychiatric: He has a normal mood and affect.    ED Course  Procedures (including critical care time) DIAGNOSTIC STUDIES:   COORDINATION OF CARE:  Medications  albuterol (PROAIR HFA) 108 (90 BASE) MCG/ACT inhaler (not administered)  polyethylene glycol powder (GLYCOLAX/MIRALAX) powder (not administered)  Lecithin 1200 MG CAPS (not administered)      Labs Reviewed - No data to display Ct Head Wo Contrast  07/16/2011  *RADIOLOGY REPORT*  Clinical Data:  Fall and complains of left head and neck pain.  CT HEAD WITHOUT CONTRAST CT CERVICAL SPINE WITHOUT CONTRAST  Technique:  Multidetector CT imaging of the head and cervical spine was performed following the standard protocol without intravenous contrast.  Multiplanar CT image reconstructions of the cervical spine were also generated.  Comparison:  05/24/2010  CT HEAD  Findings: There is stable cerebral atrophy.  Low density in the periventricular white matter.  There is new low density involving the right internal capsule and right white matter tract.  No evidence for acute hemorrhage, mass lesion, midline shift, hydrocephalus or large infarct.  No acute bony abnormality.  IMPRESSION: No acute intracranial abnormality.  Evidence for chronic small vessel ischemic changes.  There has been some progression along the right white matter tract and right internal capsule.  CT CERVICAL SPINE  Findings: Multiple levels with degenerative facet disease.  No evidence for acute fracture or dislocation.  No evidence for an apical pneumothorax.   Again noted is an enlarged and heterogeneous left thyroid gland with calcifications.  Findings compatible with a large thyroid nodule, measuring at least 4.6 cm.  There is probably a right thyroid nodule that measures 2.1 cm.  There is no evidence for soft tissue swelling within the neck.  Again noted is a disc space loss at C6-C7.  The fracture of the C6 spinous process has healed.  Mild anterolisthesis at C7-C1 appears stable.  IMPRESSION: Multilevel degenerative  changes in the cervical spine without acute bony abnormality.  Healed C6 spinous process fracture.  Thyroid goiter with a dominant left thyroid nodule/mass.  Original Report Authenticated By: Richarda Overlie, M.D.   Ct Cervical Spine Wo Contrast  07/16/2011  *RADIOLOGY REPORT*  Clinical Data:  Fall and complains of left head and neck pain.  CT HEAD WITHOUT CONTRAST CT CERVICAL SPINE WITHOUT CONTRAST  Technique:  Multidetector CT imaging of the head and cervical spine was performed following the standard protocol without intravenous contrast.  Multiplanar CT image reconstructions of the cervical spine were also generated.  Comparison:  05/24/2010  CT HEAD  Findings: There is stable cerebral atrophy.  Low density in the periventricular white matter.  There is new low density involving the right internal capsule and right white matter tract.  No evidence for acute hemorrhage, mass lesion, midline shift, hydrocephalus or large infarct.  No acute bony abnormality.  IMPRESSION: No acute intracranial abnormality.  Evidence for chronic small vessel ischemic changes.  There has been some progression along the right white matter tract and right internal capsule.  CT CERVICAL SPINE  Findings: Multiple levels with degenerative facet disease.  No evidence for acute fracture or dislocation.  No evidence for an apical pneumothorax.  Again noted is an enlarged and heterogeneous left thyroid gland with calcifications.  Findings compatible with a large thyroid nodule,  measuring at least 4.6 cm.  There is probably a right thyroid nodule that measures 2.1 cm.  There is no evidence for soft tissue swelling within the neck.  Again noted is a disc space loss at C6-C7.  The fracture of the C6 spinous process has healed.  Mild anterolisthesis at C7-C1 appears stable.  IMPRESSION: Multilevel degenerative changes in the cervical spine without acute bony abnormality.  Healed C6 spinous process fracture.  Thyroid goiter with a dominant left thyroid nodule/mass.  Original Report Authenticated By: Richarda Overlie, M.D.   Dg Shoulder Left  07/16/2011  *RADIOLOGY REPORT*  Clinical Data: Fall and complains of left shoulder pain.  LEFT SHOULDER - 2+ VIEW  Comparison: Chest radiograph 05/26/2011  Findings: Three views of the left shoulder were obtained. Degenerative changes involving the left AC joint.  There are degenerative changes along the inferior glenohumeral joint.  The left shoulder is located.  There is no evidence for acute fracture.  IMPRESSION: Degenerative changes in the left shoulder without acute bony abnormality.  Original Report Authenticated By: Richarda Overlie, M.D.     1. Fall   2. Minor head injury   3. Shoulder contusion       MDM  Mechanical fall. He had left-sided head. Negative head CT and cervical spine CT. Left shoulder pain. X-rays negative. Family member states he has chronic pain in the shoulder. He'll be discharged home  I personally performed the services described in this documentation, which was scribed in my presence. The recorded information has been reviewed and considered.          Juliet Rude. [name] Payor, MD 07/16/11 [phone]

## 2011-07-16 NOTE — ED Notes (Signed)
Report given to Mindi Junker at [facility]. granddaughter taking patient home.

## 2011-07-16 NOTE — Discharge Instructions (Signed)
Blunt Trauma °You have been evaluated for injuries. You have been examined and your caregiver has not found injuries serious enough to require hospitalization. °It is common to have multiple bruises and sore muscles following an accident. These tend to feel worse for the first 24 hours. You will feel more stiffness and soreness over the next several hours and worse when you wake up the first morning after your accident. After this point, you should begin to improve with each passing day. The amount of improvement depends on the amount of damage done in the accident. °Following your accident, if some part of your body does not work as it should, or if the pain in any area continues to increase, you should return to the Emergency Department for re-evaluation.  °HOME CARE INSTRUCTIONS  °Routine care for sore areas should include: °· Ice to sore areas every 2 hours for 20 minutes while awake for the next 2 days.  °· Drink extra fluids (not alcohol).  °· Take a hot or warm shower or bath once or twice a day to increase blood flow to sore muscles. This will help you "limber up".  °· Activity as tolerated. Lifting may aggravate neck or back pain.  °· Only take over-the-counter or prescription medicines for pain, discomfort, or fever as directed by your caregiver. Do not use aspirin. This may increase bruising or increase bleeding if there are small areas where this is happening.  °SEEK IMMEDIATE MEDICAL CARE IF: °· Numbness, tingling, weakness, or problem with the use of your arms or legs.  °· A severe headache is not relieved with medications.  °· There is a change in bowel or bladder control.  °· Increasing pain in any areas of the body.  °· Short of breath or dizzy.  °· Nauseated, vomiting, or sweating.  °· Increasing belly (abdominal) discomfort.  °· Blood in urine, stool, or vomiting blood.  °· Pain in either shoulder in an area where a shoulder strap would be.  °· Feelings of lightheadedness or if you have a fainting  episode.  °Sometimes it is not possible to identify all injuries immediately after the trauma. It is important that you continue to monitor your condition after the emergency department visit. If you feel you are not improving, or improving more slowly than should be expected, call your physician. If you feel your symptoms (problems) are worsening, return to the Emergency Department immediately. °Document Released: 01/29/2001 Document Revised: 01/15/2011 Document Reviewed: 12/22/2007 °[employer]® Patient Information ©2012 [employer]. °

## 2011-07-16 NOTE — ED Notes (Signed)
Patient to radiology via stretcher

## 2011-07-16 NOTE — ED Notes (Addendum)
Patient arrives via EMS from [facility] with c/o fall. Patient lost footing and fell hitting head on nightstand. Alert/oriented at baseline. -LOC. Patient immobilized by EMS prior to arrival. Patient c/o neck and shoulder pain from fall.

## 2011-07-16 NOTE — ED Notes (Signed)
Patient back to hallway with family from radiology. Denies any needs. Pain 3\10. States left shoulder hurts. No deformity. Strong left radial pulse and brisk cap refill. States he hit back of his head. No deformity or hematomas palpated. A&O x 4. Denies needs. C-collar remains intact.

## 2011-07-16 NOTE — ED Notes (Signed)
Remains resting with eyes open and lights on. No distress. Equal chest rise and fall, regular, unlabored. Call bell within reach. Bed in low position and locked with side rails up.

## 2011-08-02 ENCOUNTER — Encounter (HOSPITAL_COMMUNITY): Payer: Self-pay | Admitting: Emergency Medicine

## 2011-08-02 ENCOUNTER — Other Ambulatory Visit: Payer: Self-pay

## 2011-08-02 ENCOUNTER — Emergency Department (HOSPITAL_COMMUNITY): Payer: Medicare Other

## 2011-08-02 ENCOUNTER — Inpatient Hospital Stay (HOSPITAL_COMMUNITY)
Admission: EM | Admit: 2011-08-02 | Discharge: 2011-08-05 | DRG: 641 | Disposition: A | Discharge status: Nursing Home (Includes ICF) | Payer: Medicare Other | Attending: Internal Medicine | Admitting: Internal Medicine

## 2011-08-02 DIAGNOSIS — E039 Hypothyroidism, unspecified: Secondary | ICD-10-CM | POA: Diagnosis present

## 2011-08-02 DIAGNOSIS — N400 Enlarged prostate without lower urinary tract symptoms: Secondary | ICD-10-CM | POA: Diagnosis present

## 2011-08-02 DIAGNOSIS — I959 Hypotension, unspecified: Secondary | ICD-10-CM | POA: Diagnosis present

## 2011-08-02 DIAGNOSIS — K5289 Other specified noninfective gastroenteritis and colitis: Secondary | ICD-10-CM | POA: Diagnosis present

## 2011-08-02 DIAGNOSIS — I509 Heart failure, unspecified: Secondary | ICD-10-CM | POA: Diagnosis present

## 2011-08-02 DIAGNOSIS — F0390 Unspecified dementia without behavioral disturbance: Secondary | ICD-10-CM | POA: Diagnosis present

## 2011-08-02 DIAGNOSIS — J449 Chronic obstructive pulmonary disease, unspecified: Secondary | ICD-10-CM | POA: Diagnosis present

## 2011-08-02 DIAGNOSIS — I10 Essential (primary) hypertension: Secondary | ICD-10-CM | POA: Diagnosis present

## 2011-08-02 DIAGNOSIS — R1110 Vomiting, unspecified: Secondary | ICD-10-CM

## 2011-08-02 DIAGNOSIS — Z79899 Other long term (current) drug therapy: Secondary | ICD-10-CM

## 2011-08-02 DIAGNOSIS — F341 Dysthymic disorder: Secondary | ICD-10-CM | POA: Diagnosis present

## 2011-08-02 DIAGNOSIS — Z7982 Long term (current) use of aspirin: Secondary | ICD-10-CM

## 2011-08-02 DIAGNOSIS — G609 Hereditary and idiopathic neuropathy, unspecified: Secondary | ICD-10-CM | POA: Diagnosis present

## 2011-08-02 DIAGNOSIS — I714 Abdominal aortic aneurysm, without rupture, unspecified: Secondary | ICD-10-CM | POA: Diagnosis present

## 2011-08-02 DIAGNOSIS — E860 Dehydration: Principal | ICD-10-CM | POA: Diagnosis present

## 2011-08-02 DIAGNOSIS — I4891 Unspecified atrial fibrillation: Secondary | ICD-10-CM | POA: Diagnosis present

## 2011-08-02 DIAGNOSIS — M129 Arthropathy, unspecified: Secondary | ICD-10-CM | POA: Diagnosis present

## 2011-08-02 DIAGNOSIS — J4489 Other specified chronic obstructive pulmonary disease: Secondary | ICD-10-CM | POA: Diagnosis present

## 2011-08-02 HISTORY — DX: Heart failure, unspecified: I50.9

## 2011-08-02 LAB — DIFFERENTIAL
Basophils Relative: 0 % (ref 0–1)
Lymphocytes Relative: 5 % — ABNORMAL LOW (ref 12–46)
Lymphs Abs: 0.4 10*3/uL — ABNORMAL LOW (ref 0.7–4.0)
Monocytes Absolute: 0.4 10*3/uL (ref 0.1–1.0)
Monocytes Relative: 6 % (ref 3–12)
Neutro Abs: 6.3 10*3/uL (ref 1.7–7.7)
Neutrophils Relative %: 85 % — ABNORMAL HIGH (ref 43–77)

## 2011-08-02 LAB — COMPREHENSIVE METABOLIC PANEL
Albumin: 3.8 g/dL (ref 3.5–5.2)
Alkaline Phosphatase: 54 U/L (ref 39–117)
BUN: 27 mg/dL — ABNORMAL HIGH (ref 6–23)
CO2: 33 mEq/L — ABNORMAL HIGH (ref 19–32)
Chloride: 97 mEq/L (ref 96–112)
Creatinine, Ser: 1.14 mg/dL (ref 0.50–1.35)
GFR calc non Af Amer: 54 mL/min — ABNORMAL LOW (ref >90)
Glucose, Bld: 98 mg/dL (ref 70–99)
Potassium: 4 mEq/L (ref 3.5–5.1)
Total Bilirubin: 0.9 mg/dL (ref 0.3–1.2)

## 2011-08-02 LAB — URINALYSIS, ROUTINE W REFLEX MICROSCOPIC
Nitrite: NEGATIVE
Specific Gravity, Urine: 1.01 (ref 1.005–1.030)
Urobilinogen, UA: 1 mg/dL (ref 0.0–1.0)
pH: 6.5 (ref 5.0–8.0)

## 2011-08-02 LAB — CLOSTRIDIUM DIFFICILE BY PCR: Toxigenic C. Difficile by PCR: NEGATIVE

## 2011-08-02 LAB — CBC
HCT: 40.1 % (ref 39.0–52.0)
Hemoglobin: 12.8 g/dL — ABNORMAL LOW (ref 13.0–17.0)
RBC: 4.36 MIL/uL (ref 4.22–5.81)

## 2011-08-02 LAB — URINE MICROSCOPIC-ADD ON

## 2011-08-02 LAB — DIGOXIN LEVEL: Digoxin Level: 0.9 ng/mL (ref 0.8–2.0)

## 2011-08-02 MED ORDER — ONDANSETRON HCL 4 MG/2ML IJ SOLN
4.0000 mg | Freq: Four times a day (QID) | INTRAMUSCULAR | Status: DC | PRN
  Administered 2011-08-02: 4 mg via INTRAVENOUS
  Filled 2011-08-02: qty 2

## 2011-08-02 MED ORDER — ACETAMINOPHEN 650 MG RE SUPP: 650.0000 mg | Freq: Four times a day (QID) | RECTAL | Status: DC | PRN

## 2011-08-02 MED ORDER — QUINAPRIL HCL 10 MG PO TABS: 20.0000 mg | ORAL_TABLET | Freq: Every day | ORAL | Status: DC

## 2011-08-02 MED ORDER — PAROXETINE HCL 20 MG PO TABS
40.0000 mg | ORAL_TABLET | Freq: Every day | ORAL | Status: DC
  Administered 2011-08-02 – 2011-08-04 (×3): 40 mg via ORAL
  Filled 2011-08-02 (×2): qty 2
  Filled 2011-08-02: qty 1
  Filled 2011-08-02: qty 2

## 2011-08-02 MED ORDER — POTASSIUM CHLORIDE IN NACL 20-0.9 MEQ/L-% IV SOLN
INTRAVENOUS | Status: DC
  Administered 2011-08-02 – 2011-08-04 (×4): via INTRAVENOUS

## 2011-08-02 MED ORDER — ASPIRIN EC 81 MG PO TBEC
81.0000 mg | DELAYED_RELEASE_TABLET | Freq: Every day | ORAL | Status: DC
  Administered 2011-08-03: 81 mg via ORAL
  Filled 2011-08-02 (×3): qty 1

## 2011-08-02 MED ORDER — IOHEXOL 300 MG/ML  SOLN
40.0000 mL | Freq: Once | INTRAMUSCULAR | Status: AC | PRN
  Administered 2011-08-02: 40 mL via ORAL

## 2011-08-02 MED ORDER — ALPRAZOLAM 0.5 MG PO TABS
0.7500 mg | ORAL_TABLET | Freq: Every day | ORAL | Status: DC
  Administered 2011-08-02 – 2011-08-04 (×3): 0.75 mg via ORAL
  Filled 2011-08-02 (×3): qty 1

## 2011-08-02 MED ORDER — ALBUTEROL SULFATE HFA 108 (90 BASE) MCG/ACT IN AERS: 2.0000 | INHALATION_SPRAY | Freq: Four times a day (QID) | RESPIRATORY_TRACT | Status: DC | PRN

## 2011-08-02 MED ORDER — SODIUM CHLORIDE 0.9 % IV BOLUS (SEPSIS)
1000.0000 mL | Freq: Once | INTRAVENOUS | Status: AC
  Administered 2011-08-02: 1000 mL via INTRAVENOUS

## 2011-08-02 MED ORDER — IOHEXOL 300 MG/ML  SOLN
100.0000 mL | Freq: Once | INTRAMUSCULAR | Status: AC | PRN
  Administered 2011-08-02: 100 mL via INTRAVENOUS

## 2011-08-02 MED ORDER — ALBUTEROL SULFATE (5 MG/ML) 0.5% IN NEBU
2.5000 mg | INHALATION_SOLUTION | Freq: Four times a day (QID) | RESPIRATORY_TRACT | Status: DC
  Administered 2011-08-02 – 2011-08-05 (×11): 2.5 mg via RESPIRATORY_TRACT
  Filled 2011-08-02 (×12): qty 0.5

## 2011-08-02 MED ORDER — ENOXAPARIN SODIUM 40 MG/0.4ML ~~LOC~~ SOLN
40.0000 mg | Freq: Every day | SUBCUTANEOUS | Status: DC
  Administered 2011-08-02 – 2011-08-05 (×4): 40 mg via SUBCUTANEOUS
  Filled 2011-08-02 (×4): qty 0.4

## 2011-08-02 MED ORDER — DILTIAZEM HCL ER COATED BEADS 120 MG PO CP24
120.0000 mg | ORAL_CAPSULE | Freq: Every day | ORAL | Status: DC
  Administered 2011-08-03 – 2011-08-05 (×3): 120 mg via ORAL
  Filled 2011-08-02 (×6): qty 1

## 2011-08-02 MED ORDER — GABAPENTIN 300 MG PO CAPS
600.0000 mg | ORAL_CAPSULE | Freq: Four times a day (QID) | ORAL | Status: DC
  Administered 2011-08-02 – 2011-08-05 (×10): 600 mg via ORAL
  Filled 2011-08-02: qty 2
  Filled 2011-08-02: qty 1
  Filled 2011-08-02 (×13): qty 2

## 2011-08-02 MED ORDER — IPRATROPIUM BROMIDE 0.02 % IN SOLN
0.5000 mg | Freq: Four times a day (QID) | RESPIRATORY_TRACT | Status: DC
  Administered 2011-08-02 – 2011-08-05 (×11): 0.5 mg via RESPIRATORY_TRACT
  Filled 2011-08-02 (×12): qty 2.5

## 2011-08-02 MED ORDER — LEVOTHYROXINE SODIUM 50 MCG PO TABS
50.0000 ug | ORAL_TABLET | Freq: Every day | ORAL | Status: DC
  Administered 2011-08-03 – 2011-08-05 (×3): 50 ug via ORAL
  Filled 2011-08-02 (×5): qty 1

## 2011-08-02 MED ORDER — ALUM & MAG HYDROXIDE-SIMETH 200-200-20 MG/5ML PO SUSP: 30.0000 mL | Freq: Four times a day (QID) | ORAL | Status: DC | PRN

## 2011-08-02 MED ORDER — SODIUM CHLORIDE 0.9 % IJ SOLN
INTRAMUSCULAR | Status: AC
  Filled 2011-08-02: qty 3

## 2011-08-02 MED ORDER — SODIUM CHLORIDE 0.9 % IJ SOLN
INTRAMUSCULAR | Status: AC
  Administered 2011-08-02: 10 mL
  Filled 2011-08-02: qty 3

## 2011-08-02 MED ORDER — DIGOXIN 125 MCG PO TABS
125.0000 ug | ORAL_TABLET | Freq: Every day | ORAL | Status: DC
  Administered 2011-08-03 – 2011-08-05 (×3): 125 ug via ORAL
  Filled 2011-08-02 (×3): qty 1

## 2011-08-02 MED ORDER — INFLUENZA VIRUS VACC SPLIT PF IM SUSP
0.5000 mL | INTRAMUSCULAR | Status: AC
  Administered 2011-08-03: 0.5 mL via INTRAMUSCULAR
  Filled 2011-08-02: qty 0.5

## 2011-08-02 MED ORDER — ACETAMINOPHEN 325 MG PO TABS: 650.0000 mg | ORAL_TABLET | Freq: Four times a day (QID) | ORAL | Status: DC | PRN

## 2011-08-02 MED ORDER — TAMSULOSIN HCL 0.4 MG PO CAPS
0.4000 mg | ORAL_CAPSULE | Freq: Every day | ORAL | Status: DC
  Administered 2011-08-03 – 2011-08-04 (×2): 0.4 mg via ORAL
  Filled 2011-08-02 (×3): qty 1

## 2011-08-02 MED ORDER — ONDANSETRON HCL 4 MG PO TABS
4.0000 mg | ORAL_TABLET | Freq: Four times a day (QID) | ORAL | Status: DC | PRN
  Administered 2011-08-02: 4 mg via ORAL
  Filled 2011-08-02: qty 1

## 2011-08-02 NOTE — ED Notes (Signed)
Patient incontinent of loose BM. Patient cleansed, linens changed.

## 2011-08-02 NOTE — ED Notes (Signed)
Patient denies abdominal pain. 

## 2011-08-02 NOTE — ED Notes (Signed)
Dr Fagan at bedside.  

## 2011-08-02 NOTE — ED Notes (Signed)
Patient arrives from [facility] with c/o fever, nausea and vomiting that started yesterday. Patient at baseline mental status.

## 2011-08-02 NOTE — ED Provider Notes (Signed)
History  Scribed for [name] Lennert, MD, the patient was seen in room APA06/APA06. This chart was scribed by [name]. The patient's care started at 10:04 AM    CSN: [id number]  Arrival date & time 08/02/11  0941   First MD Initiated Contact with Patient 08/02/11 1002      Chief Complaint  Patient presents with  . Nausea  . Emesis    Patient is a 78 y.o. male presenting with vomiting. The history is provided by the patient, the EMS personnel and the nursing home. The history is limited by the condition of the patient.  Emesis  This is a new problem. The current episode started yesterday. The problem occurs 2 to 4 times per day. The problem has not changed since onset.The maximum temperature recorded prior to his arrival was 100 to 100.9 F. The fever has been present for 1 to 2 days. Associated symptoms include abdominal pain and a fever. Pertinent negatives include no diarrhea. Risk factors: unknown.   [name] is a 78 y.o. male who presents to the Emergency Department complaining of nausea and vomiting since yesterday.  Pt is also experiencing a fever and abdominal pain.  His PCP is Dr. Felecia [name].  Pt resides at [facility].  Pt is alert, but some what disoriented which is baseline.   Past Medical History  Diagnosis Date  . COPD (chronic obstructive pulmonary disease)   . Shortness of breath   . Asthma   . DEMENTIA     short term  . Hypertension   . Arthritis   . Hypothyroidism   . CHF (congestive heart failure)     Past Surgical History  Procedure Date  . Repair knee ligament     No family history on file.  History  Substance Use Topics  . Smoking status: Never Smoker   . Smokeless tobacco: Not on file  . Alcohol Use: No      Review of Systems  Unable to perform ROS: Other  Constitutional: Positive for fever.  Gastrointestinal: Positive for nausea, vomiting and abdominal pain. Negative for diarrhea.    Allergies  Penicillins and Sulfa  antibiotics  Home Medications   Current Outpatient Rx  Name Route Sig Dispense Refill  . ALBUTEROL SULFATE HFA 108 (90 BASE) MCG/ACT IN AERS Inhalation Inhale 2 puffs into the lungs every 6 (six) hours as needed. WHEEZING    . ALBUTEROL SULFATE (2.5 MG/3ML) 0.083% IN NEBU Nebulization Take 2.5 mg by nebulization every 6 (six) hours as needed. For shortness of breath     . ALPRAZOLAM 0.5 MG PO TABS Oral Take 0.75 mg by mouth at bedtime. Take 1 &1/2 tablets by mouth at bedtime    . ASPIRIN EC 81 MG PO TBEC Oral Take 81 mg by mouth every morning.      Marland Kitchen DIGOXIN 0.125 MG PO TABS Oral Take 125 mcg by mouth every morning.      Marland Kitchen DILTIAZEM HCL ER BEADS 120 MG PO CP24 Oral Take 120 mg by mouth every morning.      Marland Kitchen OMEGA-3 FATTY ACIDS 1000 MG PO CAPS Oral Take 1 g by mouth every morning.      . FUROSEMIDE 40 MG PO TABS Oral Take 40 mg by mouth every morning.      Marland Kitchen GABAPENTIN 600 MG PO TABS Oral Take 600 mg by mouth 4 (four) times daily as needed. For nerve pain    . IBUPROFEN-DIPHENHYDRAMINE HCL 200-25 MG PO CAPS Oral Take 2 tablets  by mouth at bedtime. For pain and sleep    . LECITHIN 1200 MG PO CAPS Oral Take 1,200 mg by mouth daily.    Marland Kitchen LEVOTHYROXINE SODIUM 50 MCG PO TABS Oral Take 50 mcg by mouth every morning.     Marland Kitchen PAROXETINE HCL 40 MG PO TABS Oral Take 40 mg by mouth at bedtime.      Marland Kitchen POLYETHYLENE GLYCOL 3350 PO POWD Oral Take 17 g by mouth daily. **MIX ONE CAPFUL IN 8 OUNCES OF WATER/JUICE THEN DRINK DAILY**    . POTASSIUM CHLORIDE ER 10 MEQ PO TBCR Oral Take 10 mEq by mouth every morning.      . QUINAPRIL HCL 20 MG PO TABS Oral Take 20 mg by mouth every morning.      . SENNA 8.6 MG PO TABS Oral Take 3 tablets by mouth at bedtime.      Marland Kitchen VITAMIN C 500 MG PO TABS Oral Take 1,000 mg by mouth every morning.     Marland Kitchen VITAMIN E 400 UNITS PO CAPS Oral Take 400 Units by mouth every morning.       BP 82/61  Pulse 81  Temp(Src) 100.2 F (37.9 C) (Rectal)  Resp 20  Ht 5\' 11"  (1.803 m)  Wt 185  lb (83.915 kg)  BMI 25.80 kg/m2  SpO2 93%  Physical Exam  Nursing note and vitals reviewed. Constitutional: He appears well-developed and well-nourished. No distress.  HENT:  Head: Normocephalic and atraumatic.       Dry mucus membranes.   Eyes: Right eye exhibits no discharge. Left eye exhibits no discharge.  Neck: Normal range of motion. Neck supple.  Pulmonary/Chest: Effort normal. No respiratory distress.  Abdominal: There is tenderness.  Musculoskeletal: He exhibits no edema and no tenderness.  Neurological: He is alert.       Disoriented to person and place.  Mildly confused.   Skin: Skin is warm and dry. He is not diaphoretic.    ED Course  Procedures = DIAGNOSTIC STUDIES: Oxygen Saturation is 93% on room air, normal by my interpretation.    COORDINATION OF CARE:  10:14AM Ordered: Differential ; Comprehensive metabolic panel ; DG Chest Port 1 View ; Insert foley catheter ; Urine culture ; Urinalysis, Routine w reflex microscopic ; sodium chloride 0.9 % bolus 1,000 mL ; Digoxin level ; ED EKG; CT ABDOMEN PELVIS W CONTRAST  12:52 PMRecheck:  Pt states abdomen is not as sore.  Discussed need for admit.   Labs Reviewed  CBC - Abnormal; Notable for the following:    Hemoglobin 12.8 (*)    All other components within normal limits  DIFFERENTIAL - Abnormal; Notable for the following:    Neutrophils Relative 85 (*)    Lymphocytes Relative 5 (*)    Lymphs Abs 0.4 (*)    All other components within normal limits  COMPREHENSIVE METABOLIC PANEL - Abnormal; Notable for the following:    CO2 33 (*)    BUN 27 (*)    AST 41 (*)    GFR calc non Af Amer 54 (*)    GFR calc Af Amer 63 (*)    All other components within normal limits  URINALYSIS, ROUTINE W REFLEX MICROSCOPIC - Abnormal; Notable for the following:    Hgb urine dipstick TRACE (*)    All other components within normal limits  URINE MICROSCOPIC-ADD ON - Abnormal; Notable for the following:    Squamous Epithelial /  LPF FEW (*)    All other components within  normal limits  DIGOXIN LEVEL  URINE CULTURE     Ct Abdomen Pelvis W Contrast  08/02/2011  *RADIOLOGY REPORT*  Clinical Data: Abdominal pain, nausea, vomiting  CT ABDOMEN AND PELVIS WITH CONTRAST  Technique:  Multidetector CT imaging of the abdomen and pelvis was performed following the standard protocol during bolus administration of intravenous contrast.  Contrast: OMNIPAQUE IOHEXOL 300 MG/ML IJ SOLN chest radiograph 08/02/2011  Comparison: Chest CT 08/02/2011  Findings: Lung bases are clear.  Heart is enlarged.  No pericardial fluid.  No focal hepatic lesion.  The gallbladder, and common bile duct are normal.  Several small cystic lesions are present within the head the pancreas.  The largest measuring 12 mm (image 27).  The body and tail the pancreas are atrophic.  There is no evidence of more proximal ductal dilatation.  Granuloma within the spleen.  Adrenal glands and kidneys are normal.  The stomach is enlarged.  The duodenum and small bowel appear normal without evidence obstruction or  inflammation.  The colon and sigmoid colon are normal.  The rectum appears normal.  Abdominal aorta is mildly aneurysmal measuring 4.1 x 3.4 cm.  No retroperitoneal periportal lymphadenopathy.  No free fluid in the pelvis.  Foley catheter within the bladder which is collapsed.  The bladder is thick-walled.  The prostate gland is mildly enlarged.  There is a protrusion into the base of the bladder measuring 15 x 13 mm best seen on sagittal image 58, series  and axial image 71).  There is a small left inguinal hernia.  No pelvic lymphadenopathy.  Review of bone windows demonstrates compression fractures at L4 which is chronic.  IMPRESSION:  1. Mild aneurysmal dilatation of the aorta.  2.  Small cystic lesion of the pancreas is likely benign.  I would not recommend specific follow-up of this lesion in patient of this age. 3.  Heart is enlarged. 4.  Stomach is enlarged  without evidence of obstruction. 5.  Protrusion into the base of the bladder may represent prostate hypertrophy.  Cannot exclude bladder neoplasm. Recommend non emergent urology consultation.  3.  Thickened bladder wall may relate to prostate hypertrophy.  Original Report Authenticated By: Genevive Bi, M.D.   Dg Chest Port 1 View  08/02/2011  *RADIOLOGY REPORT*  Clinical Data: Vomiting and wheezing  PORTABLE CHEST - 1 VIEW  Comparison: Chest radiograph 05/26/2011  Findings: Stable enlarged cardiac silhouette.  Calcification aorta is noted.  And atelectasis left lung bases slightly improved compared to prior.  No effusion, infiltrate, pneumothorax.  IMPRESSION: Improved left lower lobe atelectasis.  Stable cardiomegaly.  Original Report Authenticated By: Genevive Bi, M.D.      Date: 08/02/2011  Rate: 70  Rhythm: atrial fibrillation  With pvc  QRS Axis: indeterminate  Intervals: normal  ST/T Wave abnormalities: nonspecific ST changes  Conduction Disutrbances:left anterior fascicular block  Narrative Interpretation:   Old EKG Reviewed: none available    No results found.   No diagnosis found.    MDM      The chart was scribed for me under my direct supervision.  I personally performed the history, physical, and medical decision making and all procedures in the evaluation of this patient.[name] Lennert, MD 08/02/11 1318

## 2011-08-02 NOTE — ED Notes (Signed)
[name] with CT informed that patient has completed oral contrast.

## 2011-08-02 NOTE — H&P (Signed)
[name], [name]                ACCOUNT NO.:  [id number]  MEDICAL RECORD NO.:  [id number]  LOCATION:  A302                          FACILITY:  [facility]  PHYSICIAN:  Kingsley Callander. Ouida Sills, MD       DATE OF BIRTH:  [birth year]  DATE OF ADMISSION:  08/02/2011 DATE OF DISCHARGE:  LH                             HISTORY & PHYSICAL   CHIEF COMPLAINT:  Vomiting and diarrhea.  HISTORY OF PRESENT ILLNESS:  This patient is a 78 year old white male, patient of Dr. [name] who presented to the emergency room after repeated bouts of vomiting and diarrhea, which started yesterday.  The patient became weak and confused at [facility].  He was hypotensive on presentation to the ER with a blood pressure of 81/65. He was treated with IV fluids.  His pressure has improved to 100/81.  He has had a low-grade fever of 100.2.  Evidently, there have been multiple cases of similar symptoms at his facility.  He denies any hematemesis or rectal bleeding.  He last ate early in the day yesterday.  He denies any abdominal pain at present, but evidently had abdominal pain earlier today and underwent a CT scan of the abdomen and pelvis, which revealed no acute abnormality.  He was found to have mild aneurysmal dilatation of the aorta and a small likely benign cystic lesion on the pancreas. Cardiomegaly was shown, as was enlargement of the stomach, but no evidence of obstruction.  There was also protrusion into the base of the bladder likely related to prostatic hypertrophy.  A thickened area of bladder wall was also felt to be present.  At the time of my exam, the patient is alert, but is unable to provide a complete history due to underlying dementia.  He is accompanied by his son.  PAST MEDICAL HISTORY: 1. Dementia. 2. COPD. 3. Hypothyroidism, status post thyroid surgery. 4. Hypertension. 5. CHF. 6. Peripheral neuropathy. 7. Atrial fibrillation. 8. Depression and anxiety.  MEDICATIONS: 1. ProAir  p.r.n. 2. Xanax 0.75 mg at bedtime. 3. Aspirin 81 mg daily. 4. Calcium carbonate. 5. Vitamin D daily. 6. Lanoxin 0.125 mg daily. 7. Diltiazem XT 120 mg daily. 8. Fish oil daily. 9. Lasix 40 mg daily. 10.Gabapentin 600 mg q.i.d. 11.Advil PM 2 at bedtime. 12.Lecithin 1200 mg daily. 13.Synthroid 50 mcg daily. 14.Paxil 40 mg daily. 15.MiraLAX 17 g daily. 16.Potassium 10 mEq daily. 17.Accupril 20 mg daily. 18.Senokot 3 tablets at bedtime. 19.Flomax 0.4 mg daily. 20.Vitamin C 1000 mg daily. 21.Vitamin E 400 units daily.  ALLERGIES:  PENICILLIN and SULFA.  SOCIAL HISTORY:  He does not smoke or drink.  FAMILY HISTORY:  His father died of kidney failure in his late 49s.  His mother died of a stroke.  REVIEW OF SYSTEMS:  He currently denies any chest pain, abdominal pain, or difficulty breathing.  A Foley catheter has been place.  He has previously had difficulty with incontinence.  PHYSICAL EXAMINATION:  VITAL SIGNS:  Temperature 100.2, pulse 82 and irregular, respirations 20, blood pressure 100/81, oxygen saturation 100% on 2 L by nasal cannula. GENERAL:  Alert and comfortable. HEENT:  Eyes revealed post cataract surgery changes.  No  scleral icterus.  Pharynx appears dry. NECK:  Reveals no JVD or thyromegaly. LUNGS:  Bilateral wheezes. HEART:  Irregular with no murmurs. ABDOMEN:  Soft and nontender with no hepatosplenomegaly. EXTREMITIES:  No cyanosis, clubbing, or edema. NEUROLOGIC:  No focal weakness. LYMPH NODES:  No cervical or supraclavicular enlargement. SKIN:  Warm and dry.  He has several bruises.  LABORATORY DATA:  White count 7.4, hemoglobin 12.8, platelets 161,000, 85 segs, 5 lymphs.  Sodium 135, potassium 4.0, bicarb 33, BUN 27, creatinine 1.14, calcium 10.0, AST 41, ALT 18, total protein 6.9. Urinalysis reveals 7-10 wbc's and 3-6 rbc's.  Digoxin level 0.9, glucose 98.  His chest x-ray reveals improved left lower lobe atelectasis compared to May 26, 2011.   His EKG reveals atrial fibrillation at 70 beats per minute with PVCs, low voltage, possible old anterior infarct, and left anterior hemiblock.  IMPRESSION/PLAN: 1. Gastroenteritis with dehydration.  He has received fluid boluses,     and will be continued on IV fluids.  His electrolytes currently are     normal.  He has a normal white count, but a left shift.  Stool     studies will be obtained. 2. Chronic atrial fibrillation.  His rate is controlled on digoxin and     diltiazem.  His digoxin level is therapeutic. 3. Dementia stable. 4. Bladder wall thickening.  Urologic workup at a later date. 5. Hypothyroidism.  He had a CT of the cervical spine last month,     which revealed a goiter with a dominant left thyroid nodule.     Further evaluation per Dr. [name]. 6. Anxiety and depression.  Continue paroxetine. 7. Possible history of congestive heart failure.  We will hold     Accupril in light of his lower blood pressure earlier today.  We     will also hold his Lasix, but will be cautious with his volume     repletion and we will restart Lasix when appropriate. 8. Benign prostatic hypertrophy.  Continue Flomax. 9. Chronic obstructive pulmonary disease.  We will treat with     nebulizers q.6.     Kingsley Callander. Ouida Sills, MD     ROF/MEDQ  D:  08/02/2011  T:  08/02/2011  Job:  [id number]

## 2011-08-02 NOTE — ED Notes (Signed)
Report given to Jessica, RN unit 300. Ready to receive patient. 

## 2011-08-03 LAB — BASIC METABOLIC PANEL
BUN: 19 mg/dL (ref 6–23)
Calcium: 8.6 mg/dL (ref 8.4–10.5)
Creatinine, Ser: 0.97 mg/dL (ref 0.50–1.35)
GFR calc Af Amer: 81 mL/min — ABNORMAL LOW (ref >90)
GFR calc non Af Amer: 70 mL/min — ABNORMAL LOW (ref >90)
Potassium: 3.5 mEq/L (ref 3.5–5.1)

## 2011-08-03 LAB — FECAL LACTOFERRIN, QUANT: Fecal Lactoferrin: POSITIVE

## 2011-08-03 LAB — URINE CULTURE: Culture: NO GROWTH

## 2011-08-03 MED ORDER — DIPHENHYDRAMINE HCL 25 MG PO CAPS: 25.0000 mg | ORAL_CAPSULE | Freq: Every evening | ORAL | Status: DC | PRN

## 2011-08-03 MED ORDER — ALPRAZOLAM 0.25 MG PO TABS
0.2500 mg | ORAL_TABLET | Freq: Three times a day (TID) | ORAL | Status: DC | PRN
  Administered 2011-08-03: 0.25 mg via ORAL
  Filled 2011-08-03: qty 1

## 2011-08-03 MED ORDER — BIOTENE DRY MOUTH MT LIQD
15.0000 mL | Freq: Two times a day (BID) | OROMUCOSAL | Status: DC
  Administered 2011-08-03 – 2011-08-05 (×5): 15 mL via OROMUCOSAL

## 2011-08-03 NOTE — Progress Notes (Signed)
Subjective: He was admitted yesterday with vomiting and diarrhea. He lives in an assisted living center has been an outbreak of gastroenteritis. He was very weak. He has apparently not been able to sleep.  Objective: Vital signs in last 24 hours: Temp:  [98.3 F (36.8 C)-99.6 F (37.6 C)] 99.6 F (37.6 C) (03/17 0602) Pulse Rate:  [79-93] 88  (03/17 0602) Resp:  [17-21] 20  (03/17 0602) BP: (81-161)/(65-81) 161/74 mmHg (03/17 0602) SpO2:  [93 %-100 %] 97 % (03/17 0739) Weight:  [84.6 kg (186 lb 8.2 oz)-86.4 kg (190 lb 7.6 oz)] 86.4 kg (190 lb 7.6 oz) (03/17 0602) Weight change:  Last BM Date: 08/03/11  Intake/Output from previous day: 03/16 0701 - 03/17 0700 In: 1040 [P.O.:120; I.V.:920] Out: 352 [Urine:350; Stool:2]  PHYSICAL EXAM General appearance: alert, cooperative and mild distress Resp: clear to auscultation bilaterally Cardio: irregularly irregular rhythm GI: Mildly diffusely tender with active bowel sounds Extremities: extremities normal, atraumatic, no cyanosis or edema  Lab Results:    Basic Metabolic Panel:  Basename 08/03/11 0655 08/02/11 1017  NA 139 135  K 3.5 4.0  CL 102 97  CO2 30 33*  GLUCOSE 93 98  BUN 19 27*  CREATININE 0.97 1.14  CALCIUM 8.6 10.0  MG -- --  PHOS -- --   Liver Function Tests:  Basename 08/02/11 1017  AST 41*  ALT 18  ALKPHOS 54  BILITOT 0.9  PROT 6.9  ALBUMIN 3.8   No results found for this basename: LIPASE:2,AMYLASE:2 in the last 72 hours No results found for this basename: AMMONIA:2 in the last 72 hours CBC:  Basename 08/02/11 1017  WBC 7.4  NEUTROABS 6.3  HGB 12.8*  HCT 40.1  MCV 92.0  PLT 161   Cardiac Enzymes: No results found for this basename: CKTOTAL:3,CKMB:3,CKMBINDEX:3,TROPONINI:3 in the last 72 hours BNP: No results found for this basename: PROBNP:3 in the last 72 hours D-Dimer: No results found for this basename: DDIMER:2 in the last 72 hours CBG: No results found for this basename: GLUCAP:6  in the last 72 hours Hemoglobin A1C: No results found for this basename: HGBA1C in the last 72 hours Fasting Lipid Panel: No results found for this basename: CHOL,HDL,LDLCALC,TRIG,CHOLHDL,LDLDIRECT in the last 72 hours Thyroid Function Tests: No results found for this basename: TSH,T4TOTAL,FREET4,T3FREE,THYROIDAB in the last 72 hours Anemia Panel: No results found for this basename: VITAMINB12,FOLATE,FERRITIN,TIBC,IRON,RETICCTPCT in the last 72 hours Coagulation: No results found for this basename: LABPROT:2,INR:2 in the last 72 hours Urine Drug Screen: Drugs of Abuse  No results found for this basename: labopia, cocainscrnur, labbenz, amphetmu, thcu, labbarb    Alcohol Level: No results found for this basename: ETH:2 in the last 72 hours Urinalysis:  Basename 08/02/11 1050  COLORURINE YELLOW  LABSPEC 1.010  PHURINE 6.5  GLUCOSEU NEGATIVE  HGBUR TRACE*  BILIRUBINUR NEGATIVE  KETONESUR NEGATIVE  PROTEINUR NEGATIVE  UROBILINOGEN 1.0  NITRITE NEGATIVE  LEUKOCYTESUR NEGATIVE   Misc. Labs:  ABGS No results found for this basename: PHART,PCO2,PO2ART,TCO2,HCO3 in the last 72 hours CULTURES Recent Results (from the past 240 hour(s))  CLOSTRIDIUM DIFFICILE BY PCR     Status: Normal   Collection Time   08/02/11  2:33 PM      Component Value Range Status Comment   C difficile by pcr NEGATIVE  NEGATIVE  Final    Studies/Results: Ct Abdomen Pelvis W Contrast  08/02/2011  *RADIOLOGY REPORT*  Clinical Data: Abdominal pain, nausea, vomiting  CT ABDOMEN AND PELVIS WITH CONTRAST  Technique:  Multidetector  CT imaging of the abdomen and pelvis was performed following the standard protocol during bolus administration of intravenous contrast.  Contrast: OMNIPAQUE IOHEXOL 300 MG/ML IJ SOLN chest radiograph 08/02/2011  Comparison: Chest CT 08/02/2011  Findings: Lung bases are clear.  Heart is enlarged.  No pericardial fluid.  No focal hepatic lesion.  The gallbladder, and common bile duct  are normal.  Several small cystic lesions are present within the head the pancreas.  The largest measuring 12 mm (image 27).  The body and tail the pancreas are atrophic.  There is no evidence of more proximal ductal dilatation.  Granuloma within the spleen.  Adrenal glands and kidneys are normal.  The stomach is enlarged.  The duodenum and small bowel appear normal without evidence obstruction or  inflammation.  The colon and sigmoid colon are normal.  The rectum appears normal.  Abdominal aorta is mildly aneurysmal measuring 4.1 x 3.4 cm.  No retroperitoneal periportal lymphadenopathy.  No free fluid in the pelvis.  Foley catheter within the bladder which is collapsed.  The bladder is thick-walled.  The prostate gland is mildly enlarged.  There is a protrusion into the base of the bladder measuring 15 x 13 mm best seen on sagittal image 58, series  and axial image 71).  There is a small left inguinal hernia.  No pelvic lymphadenopathy.  Review of bone windows demonstrates compression fractures at L4 which is chronic.  IMPRESSION:  1. Mild aneurysmal dilatation of the aorta.  2.  Small cystic lesion of the pancreas is likely benign.  I would not recommend specific follow-up of this lesion in patient of this age. 3.  Heart is enlarged. 4.  Stomach is enlarged without evidence of obstruction. 5.  Protrusion into the base of the bladder may represent prostate hypertrophy.  Cannot exclude bladder neoplasm. Recommend non emergent urology consultation.  3.  Thickened bladder wall may relate to prostate hypertrophy.  Original Report Authenticated By: Genevive Bi, M.D.   Dg Chest Port 1 View  08/02/2011  *RADIOLOGY REPORT*  Clinical Data: Vomiting and wheezing  PORTABLE CHEST - 1 VIEW  Comparison: Chest radiograph 05/26/2011  Findings: Stable enlarged cardiac silhouette.  Calcification aorta is noted.  And atelectasis left lung bases slightly improved compared to prior.  No effusion, infiltrate, pneumothorax.   IMPRESSION: Improved left lower lobe atelectasis.  Stable cardiomegaly.  Original Report Authenticated By: Genevive Bi, M.D.    Medications:  Scheduled:   . ipratropium  0.5 mg Nebulization Q6H   And  . albuterol  2.5 mg Nebulization Q6H  . ALPRAZolam  0.75 mg Oral QHS  . antiseptic oral rinse  15 mL Mouth Rinse BID  . aspirin EC  81 mg Oral Daily  . digoxin  125 mcg Oral Daily  . diltiazem  120 mg Oral Daily  . enoxaparin  40 mg Subcutaneous Daily  . gabapentin  600 mg Oral QID  . influenza  inactive virus vaccine  0.5 mL Intramuscular Tomorrow-1000  . levothyroxine  50 mcg Oral Daily  . PARoxetine  40 mg Oral QHS  . sodium chloride  1,000 mL Intravenous Once  . sodium chloride      . sodium chloride      . Tamsulosin HCl  0.4 mg Oral QPC supper  . DISCONTD: quinapril  20 mg Oral Daily   Continuous:   . 0.9 % NaCl with KCl 20 mEq / L 75 mL/hr at 08/03/11 0439   ION:[id number], acetaminophen, albuterol, ALPRAZolam, alum & mag  hydroxide-simeth, diphenhydrAMINE, iohexol, ondansetron (ZOFRAN) IV, ondansetron  Assesment: He has acute gastroenteritis. He is somewhat better according to his son. He has still had another episode of diarrhea this morning. Active Problems:  * No active hospital problems. *     Plan: No change in treatments. I told his son that this frequently last 3 or 4 days. I did give him some Benadryl which apparently he uses to help sleep at night.    LOS: 1 day   Jerricka Carvey L 08/03/2011, 10:30 AM

## 2011-08-04 MED ORDER — ASPIRIN EC 81 MG PO TBEC
81.0000 mg | DELAYED_RELEASE_TABLET | Freq: Every day | ORAL | Status: DC
  Administered 2011-08-04 – 2011-08-05 (×2): 81 mg via ORAL
  Filled 2011-08-04 (×2): qty 1

## 2011-08-04 NOTE — Progress Notes (Signed)
[name], [name]                ACCOUNT NO.:  [id number]  MEDICAL RECORD NO.:  [id number]  LOCATION:  A302                          FACILITY:  [facility]  PHYSICIAN:  Jori Frerichs D. [name], MD   DATE OF BIRTH:  April 11, 1918  DATE OF PROCEDURE:  08/04/2011 DATE OF DISCHARGE:                                PROGRESS NOTE   SUBJECTIVE:  The patient feels slightly better.  His nausea, vomiting subsided.  However, continued to have watery diarrhea.  His C. diff came back negative.  OBJECTIVE:  GENERAL:  The patient is alert, awake, and chronically sick looking. VITAL SIGNS:  Blood pressure 112/80, pulse 82, respiratory rate 20, temperature 97.4 degree Fahrenheit. CHEST:  Clear lung fields.  Good air entry. CARDIOVASCULAR SYSTEM:  First and second heart sounds heard.  No murmur. No gallop. ABDOMEN:  Soft and lax.  Bowel sound is positive.  No mass or organomegaly.  No area of tenderness. EXTREMITIES:  No leg edema.  ASSESSMENT: 1. Acute gastroenteritis. 2. Probably mild dehydration. 3. Dementia. 4. Chronic atrial fibrillation with controlled heart rate. 5. Anxiety depression disorder. 6. History of chronic obstructive pulmonary disease.  PLAN:  We will continue the patient on symptomatic treatment.  We will decrease his IV fluids to 50 mL per hour.  We will continue advancing diet as the patient tolerates.     Briannie Gutierrez D. [name], MD     TDF/MEDQ  D:  08/04/2011  T:  08/04/2011  Job:  [id number]

## 2011-08-04 NOTE — Progress Notes (Signed)
   CARE MANAGEMENT NOTE 08/04/2011  Patient:  [name],[name]   Account Number:  [id number]  Date Initiated:  08/04/2011  Documentation initiated by:  Sharrie Rothman  Subjective/Objective Assessment:   Pt admitted from [facility] with norovirus. Pt will return at discharge. Pt has walker at [facility].     Action/Plan:   CM will consult CSW about pt admission. No other CM needs at this time   Anticipated DC Date:  08/08/2011   Anticipated DC Plan:  ASSISTED LIVING / REST HOME  In-house referral  Clinical Social Worker      DC Planning Services  CM consult      Choice offered to / List presented to:             Status of service:  In process, will continue to follow Medicare Important Message given?   (If response is "NO", the following Medicare IM given date fields will be blank) Date Medicare IM given:   Date Additional Medicare IM given:    Discharge Disposition:  ASSISTED LIVING  Per UR Regulation:    If discussed at Long Length of Stay Meetings, dates discussed:    Comments:  08/04/11 1131 Arlyss Queen RN BSN CM [phone] Pt admitted from [facility] with norovirus. CSW is aware of pt admission. Will follow for any HH needs.

## 2011-08-05 LAB — BASIC METABOLIC PANEL
CO2: 27 mEq/L (ref 19–32)
Calcium: 8.9 mg/dL (ref 8.4–10.5)
Creatinine, Ser: 0.89 mg/dL (ref 0.50–1.35)
GFR calc non Af Amer: 73 mL/min — ABNORMAL LOW (ref >90)
Sodium: 141 mEq/L (ref 135–145)

## 2011-08-05 LAB — CBC
MCV: 92.8 fL (ref 78.0–100.0)
Platelets: 129 10*3/uL — ABNORMAL LOW (ref 150–400)
RBC: 3.73 MIL/uL — ABNORMAL LOW (ref 4.22–5.81)
RDW: 14.3 % (ref 11.5–15.5)
WBC: 6.7 10*3/uL (ref 4.0–10.5)

## 2011-08-05 LAB — STOOL CULTURE

## 2011-08-05 NOTE — Progress Notes (Signed)
Utilization review completed.  

## 2011-08-05 NOTE — Progress Notes (Signed)
Clinical Social Work Department BRIEF PSYCHOSOCIAL ASSESSMENT 08/05/2011  Patient:  [name],[name]     Account Number:  [id number]     Admit date:  08/02/2011  Clinical Social Worker:  Robin Searing  Date/Time:  08/05/2011 04:01 PM  Referred by:  Physician  Date Referred:  08/05/2011 Referred for  ALF Placement   Other Referral:   Interview type:  Family Other interview type:    PSYCHOSOCIAL DATA Living Status:  FACILITY Admitted from facility:  [facility] Level of care:  Assisted Living Primary support name:  son Primary support relationship to patient:  CHILD, ADULT Degree of support available:   good- from ALF    CURRENT CONCERNS  Other Concerns:    SOCIAL WORK ASSESSMENT / PLAN Return to ALF   Assessment/plan status:  Other - See comment Other assessment/ plan:   Return to ALF   Information/referral to community resources:    PATIENT'S/FAMILY'S RESPONSE TO PLAN OF CARE: Agreeable to return- tranpsort via car

## 2011-08-05 NOTE — Progress Notes (Signed)
Patient discharged home via family; Pt given and explained discharge instructions, prescriptions, carenotes; stated understanding and denied questions; pts IV removed without problems; pt stable at time of discharge   

## 2011-08-05 NOTE — Progress Notes (Signed)
Patient for d/c back to [facility] today- son to transport by car. Reece Levy, MSW, Theresia Majors [phone]

## 2011-08-05 NOTE — Progress Notes (Signed)
   CARE MANAGEMENT NOTE 08/05/2011  Patient:  Cuello,Robson   Account Number:  [id number]  Date Initiated:  08/04/2011  Documentation initiated by:  Sharrie Rothman  Subjective/Objective Assessment:   Pt admitted from [facility] with norovirus. Pt will return at discharge. Pt has walker at [facility].     Action/Plan:   CM will consult CSW about pt admission. No other CM needs at this time   Anticipated DC Date:  08/08/2011   Anticipated DC Plan:  ASSISTED LIVING / REST HOME  In-house referral  Clinical Social Worker      DC Planning Services  CM consult      Choice offered to / List presented to:             Status of service:  Completed, signed off Medicare Important Message given?  YES (If response is "NO", the following Medicare IM given date fields will be blank) Date Medicare IM given:  08/05/2011 Date Additional Medicare IM given:    Discharge Disposition:  ASSISTED LIVING  Per UR Regulation:    If discussed at Long Length of Stay Meetings, dates discussed:    Comments:  08/05/11 1234 Derran Sear,RN BSN CM Pt discharged back to [facility] today. No CM needs at this time. CSW will make arrangements. 08/04/11 1131 Arlyss Queen RN BSN CM [phone] Pt admitted from [facility] with norovirus. CSW is aware of pt admission. Will follow for any HH needs

## 2011-08-07 ENCOUNTER — Other Ambulatory Visit: Payer: Self-pay

## 2011-08-07 ENCOUNTER — Encounter (HOSPITAL_COMMUNITY): Payer: Self-pay | Admitting: *Deleted

## 2011-08-07 ENCOUNTER — Emergency Department (HOSPITAL_COMMUNITY): Payer: Medicare Other

## 2011-08-07 ENCOUNTER — Inpatient Hospital Stay (HOSPITAL_COMMUNITY)
Admission: EM | Admit: 2011-08-07 | Discharge: 2011-08-11 | DRG: 293 | Disposition: A | Discharge status: Home Health | Payer: Medicare Other | Attending: Internal Medicine | Admitting: Internal Medicine

## 2011-08-07 DIAGNOSIS — Z66 Do not resuscitate: Secondary | ICD-10-CM | POA: Diagnosis present

## 2011-08-07 DIAGNOSIS — E039 Hypothyroidism, unspecified: Secondary | ICD-10-CM | POA: Diagnosis present

## 2011-08-07 DIAGNOSIS — F341 Dysthymic disorder: Secondary | ICD-10-CM | POA: Diagnosis present

## 2011-08-07 DIAGNOSIS — R4182 Altered mental status, unspecified: Secondary | ICD-10-CM | POA: Diagnosis present

## 2011-08-07 DIAGNOSIS — F068 Other specified mental disorders due to known physiological condition: Secondary | ICD-10-CM | POA: Diagnosis present

## 2011-08-07 DIAGNOSIS — I5032 Chronic diastolic (congestive) heart failure: Principal | ICD-10-CM | POA: Diagnosis present

## 2011-08-07 DIAGNOSIS — R7402 Elevation of levels of lactic acid dehydrogenase (LDH): Secondary | ICD-10-CM | POA: Diagnosis present

## 2011-08-07 DIAGNOSIS — I10 Essential (primary) hypertension: Secondary | ICD-10-CM | POA: Diagnosis present

## 2011-08-07 DIAGNOSIS — R7401 Elevation of levels of liver transaminase levels: Secondary | ICD-10-CM | POA: Diagnosis present

## 2011-08-07 DIAGNOSIS — R531 Weakness: Secondary | ICD-10-CM

## 2011-08-07 DIAGNOSIS — N400 Enlarged prostate without lower urinary tract symptoms: Secondary | ICD-10-CM | POA: Diagnosis present

## 2011-08-07 DIAGNOSIS — Z7982 Long term (current) use of aspirin: Secondary | ICD-10-CM

## 2011-08-07 DIAGNOSIS — I959 Hypotension, unspecified: Secondary | ICD-10-CM | POA: Diagnosis present

## 2011-08-07 DIAGNOSIS — J449 Chronic obstructive pulmonary disease, unspecified: Secondary | ICD-10-CM | POA: Diagnosis present

## 2011-08-07 DIAGNOSIS — R748 Abnormal levels of other serum enzymes: Secondary | ICD-10-CM

## 2011-08-07 DIAGNOSIS — I4510 Unspecified right bundle-branch block: Secondary | ICD-10-CM | POA: Diagnosis present

## 2011-08-07 DIAGNOSIS — I4891 Unspecified atrial fibrillation: Secondary | ICD-10-CM | POA: Diagnosis present

## 2011-08-07 DIAGNOSIS — Z79899 Other long term (current) drug therapy: Secondary | ICD-10-CM

## 2011-08-07 DIAGNOSIS — R5381 Other malaise: Secondary | ICD-10-CM | POA: Diagnosis present

## 2011-08-07 DIAGNOSIS — J4489 Other specified chronic obstructive pulmonary disease: Secondary | ICD-10-CM | POA: Diagnosis present

## 2011-08-07 DIAGNOSIS — I509 Heart failure, unspecified: Secondary | ICD-10-CM | POA: Diagnosis present

## 2011-08-07 LAB — DIFFERENTIAL
Basophils Absolute: 0.1 10*3/uL (ref 0.0–0.1)
Basophils Relative: 1 % (ref 0–1)
Lymphocytes Relative: 18 % (ref 12–46)
Monocytes Relative: 12 % (ref 3–12)
Neutro Abs: 4.5 10*3/uL (ref 1.7–7.7)
Neutrophils Relative %: 63 % (ref 43–77)

## 2011-08-07 LAB — CARDIAC PANEL(CRET KIN+CKTOT+MB+TROPI)
Relative Index: 5.2 — ABNORMAL HIGH (ref 0.0–2.5)
Troponin I: 0.42 ng/mL (ref <0.30)

## 2011-08-07 LAB — CBC
Hemoglobin: 12.5 g/dL — ABNORMAL LOW (ref 13.0–17.0)
MCHC: 32.4 g/dL (ref 30.0–36.0)
RDW: 14.1 % (ref 11.5–15.5)
WBC: 7.2 10*3/uL (ref 4.0–10.5)

## 2011-08-07 LAB — BASIC METABOLIC PANEL
CO2: 30 mEq/L (ref 19–32)
Chloride: 102 mEq/L (ref 96–112)
GFR calc Af Amer: 80 mL/min — ABNORMAL LOW (ref >90)
Potassium: 3.7 mEq/L (ref 3.5–5.1)

## 2011-08-07 LAB — TROPONIN I: Troponin I: 0.45 ng/mL (ref <0.30)

## 2011-08-07 LAB — PRO B NATRIURETIC PEPTIDE: Pro B Natriuretic peptide (BNP): 3452 pg/mL — ABNORMAL HIGH (ref 0–450)

## 2011-08-07 MED ORDER — GABAPENTIN 600 MG PO TABS
600.0000 mg | ORAL_TABLET | Freq: Four times a day (QID) | ORAL | Status: DC
  Filled 2011-08-07 (×4): qty 1

## 2011-08-07 MED ORDER — VITAMIN C 500 MG PO TABS
1000.0000 mg | ORAL_TABLET | Freq: Every day | ORAL | Status: DC
  Administered 2011-08-08 – 2011-08-11 (×4): 1000 mg via ORAL
  Filled 2011-08-07: qty 2
  Filled 2011-08-07: qty 1
  Filled 2011-08-07: qty 2
  Filled 2011-08-07 (×2): qty 1

## 2011-08-07 MED ORDER — ENOXAPARIN SODIUM 40 MG/0.4ML ~~LOC~~ SOLN
40.0000 mg | SUBCUTANEOUS | Status: DC
  Administered 2011-08-07 – 2011-08-10 (×4): 40 mg via SUBCUTANEOUS
  Filled 2011-08-07 (×4): qty 0.4

## 2011-08-07 MED ORDER — POLYETHYLENE GLYCOL 3350 17 GM/SCOOP PO POWD
17.0000 g | Freq: Every day | ORAL | Status: DC
  Filled 2011-08-07: qty 255

## 2011-08-07 MED ORDER — DILTIAZEM HCL ER COATED BEADS 120 MG PO CP24
120.0000 mg | ORAL_CAPSULE | Freq: Every day | ORAL | Status: DC
  Administered 2011-08-08 – 2011-08-11 (×4): 120 mg via ORAL
  Filled 2011-08-07 (×5): qty 1

## 2011-08-07 MED ORDER — SODIUM CHLORIDE 0.9 % IV BOLUS (SEPSIS)
250.0000 mL | Freq: Once | INTRAVENOUS | Status: AC
  Administered 2011-08-07: 250 mL via INTRAVENOUS

## 2011-08-07 MED ORDER — VITAMIN E 180 MG (400 UNIT) PO CAPS
400.0000 [IU] | ORAL_CAPSULE | Freq: Every day | ORAL | Status: DC
  Administered 2011-08-08 – 2011-08-11 (×4): 400 [IU] via ORAL
  Filled 2011-08-07 (×6): qty 1

## 2011-08-07 MED ORDER — OMEGA-3-ACID ETHYL ESTERS 1 G PO CAPS
1.0000 g | ORAL_CAPSULE | Freq: Every day | ORAL | Status: DC
  Administered 2011-08-08 – 2011-08-11 (×4): 1 g via ORAL
  Filled 2011-08-07 (×9): qty 1

## 2011-08-07 MED ORDER — POTASSIUM CHLORIDE CRYS ER 10 MEQ PO TBCR
10.0000 meq | EXTENDED_RELEASE_TABLET | Freq: Every day | ORAL | Status: DC
  Filled 2011-08-07: qty 1

## 2011-08-07 MED ORDER — LEVOTHYROXINE SODIUM 50 MCG PO TABS
50.0000 ug | ORAL_TABLET | Freq: Every day | ORAL | Status: DC
  Administered 2011-08-08 – 2011-08-11 (×4): 50 ug via ORAL
  Filled 2011-08-07 (×4): qty 1

## 2011-08-07 MED ORDER — PAROXETINE HCL 20 MG PO TABS
40.0000 mg | ORAL_TABLET | Freq: Every day | ORAL | Status: DC
  Administered 2011-08-08 – 2011-08-10 (×3): 40 mg via ORAL
  Filled 2011-08-07: qty 1
  Filled 2011-08-07 (×2): qty 2
  Filled 2011-08-07: qty 1

## 2011-08-07 MED ORDER — CHLORHEXIDINE GLUCONATE 0.12 % MT SOLN
15.0000 mL | Freq: Two times a day (BID) | OROMUCOSAL | Status: DC
  Administered 2011-08-07 – 2011-08-11 (×8): 15 mL via OROMUCOSAL
  Filled 2011-08-07 (×8): qty 15

## 2011-08-07 MED ORDER — DEXTROSE 5 % IV SOLN
INTRAVENOUS | Status: DC
  Administered 2011-08-07 – 2011-08-08 (×2): via INTRAVENOUS

## 2011-08-07 MED ORDER — CALCIUM CARBONATE-VITAMIN D 500-200 MG-UNIT PO TABS
1.0000 | ORAL_TABLET | Freq: Every day | ORAL | Status: DC
  Administered 2011-08-08 – 2011-08-11 (×4): 1 via ORAL
  Filled 2011-08-07 (×4): qty 1

## 2011-08-07 MED ORDER — BIOTENE DRY MOUTH MT LIQD
15.0000 mL | Freq: Two times a day (BID) | OROMUCOSAL | Status: DC
  Administered 2011-08-07 – 2011-08-10 (×6): 15 mL via OROMUCOSAL

## 2011-08-07 MED ORDER — FUROSEMIDE 40 MG PO TABS: 40.0000 mg | ORAL_TABLET | Freq: Every day | ORAL | Status: DC

## 2011-08-07 MED ORDER — ASPIRIN EC 81 MG PO TBEC
81.0000 mg | DELAYED_RELEASE_TABLET | Freq: Every day | ORAL | Status: DC
  Administered 2011-08-08 – 2011-08-11 (×4): 81 mg via ORAL
  Filled 2011-08-07 (×4): qty 1

## 2011-08-07 MED ORDER — DIGOXIN 125 MCG PO TABS
125.0000 ug | ORAL_TABLET | Freq: Every day | ORAL | Status: DC
  Administered 2011-08-08 – 2011-08-11 (×4): 125 ug via ORAL
  Filled 2011-08-07 (×4): qty 1

## 2011-08-07 MED ORDER — ALBUTEROL SULFATE (5 MG/ML) 0.5% IN NEBU
2.5000 mg | INHALATION_SOLUTION | Freq: Once | RESPIRATORY_TRACT | Status: AC
  Administered 2011-08-07: 2.5 mg via RESPIRATORY_TRACT
  Filled 2011-08-07: qty 0.5

## 2011-08-07 MED ORDER — SENNA 8.6 MG PO TABS
3.0000 | ORAL_TABLET | Freq: Every day | ORAL | Status: DC
  Administered 2011-08-08 – 2011-08-10 (×3): 25.8 mg via ORAL
  Filled 2011-08-07: qty 3
  Filled 2011-08-07: qty 1
  Filled 2011-08-07: qty 2
  Filled 2011-08-07: qty 1
  Filled 2011-08-07: qty 2

## 2011-08-07 NOTE — Discharge Summary (Signed)
[name], [name]                ACCOUNT NO.:  [id number]  MEDICAL RECORD NO.:  [id number]  LOCATION:                                 FACILITY:  PHYSICIAN:  Darrelle Wiberg D. [name], MD   DATE OF BIRTH:  [birth year]  DATE OF ADMISSION:  08/02/2011 DATE OF DISCHARGE:  LH                              DISCHARGE SUMMARY   DISCHARGE DIAGNOSES: 1. Acute gastroenteritis. 2. Mild dehydration. 3. Chronic atrial fibrillation. 4. Dementia. 5. History of bladder wall thickening. 6. Hypothyroidism. 7. Anxiety, depression disorder. 8. Prostatic hypertrophy. 9. Chronic obstructive pulmonary disease.  DISCHARGE MEDICATIONS: 1. Advil PM 200/25 two tablets by mouth at bedtime p.r.n. 2. Albuterol inhaler 2.5 mg via nebulizer q.6 hours p.r.n. 3. ProAir 2 puffs every 6 hours p.r.n. 4. Xanax 0.75 mg at bedtime. 5. Aspirin 81 mg daily. 6. Calcium with vitamin D one tablet daily. 7. Digoxin 0.125 mg daily. 8. Fish oil 1 g daily. 9. Lasix 40 mg daily. 10.Gabapentin 600 mg 4 times daily. 11.Lecithin 1200 mg 1 capsule daily. 12.Levothyroxine 50 mcg daily. 13.Paxil 40 mg daily. 14.MiraLax 17 g daily as needed. 15.KCl 10 mEq daily. 16.Accupril 20 mg daily. 17.Senna 8.6 three tablets at bedtime. 18.Flomax 0.4 mg daily. 19.Diltiazem 120 mg daily. 20.Vitamin C 1000 mg daily. 21.Vitamin E 4000 units daily.  DISPOSITION:  The patient will be discharged back to [facility] in stable condition.  DISCHARGE INSTRUCTIONS:  The patient will be followed in the office in 1 week duration.  He will continue his regular medications.  LABS ON DISCHARGE: Sodium 141, potassium 4.0, chloride 107, carbon dioxide 27, glucose 107, BUN 13, creatinine 0.89, calcium 8.9.  CBC:  WBC 6.7, hemoglobin 11.2, hematocrit 34.6, and platelets 129.  HOSPITAL COURSE:  This is a 78 year old male patient with history of multiple medical illnesses, was admitted due to nausea, vomiting, and diarrhea.  He was in assisted  living in [facility].  He was admitted as a possible case of gastroenteritis.  The patient was mildly hypotensive while he was initially seen in the emergency room.  The patient received IV fluid and he was rehydrated.  His symptoms were treated with symptomatic medications.  Over the hospital stay, the patient improved.  He was able to tolerate full clear liquid. Currently, no nausea, vomiting, or diarrhea.  The patient is back to his baseline.  He will be discharged to [facility] to continue his regular treatment.     [name] D. [name], MD     TDF/MEDQ  D:  08/05/2011  T:  08/05/2011  Job:  [id number]

## 2011-08-07 NOTE — ED Notes (Signed)
CRITICAL VALUE ALERT  Critical value received:  Troponin 0.45  Date of notification:  08/07/2011  Time of notification:  1537  Critical value read back:yes  Nurse who received alert:  EM  MD notified (1st page):    Time of first page:    MD notified (2nd page):  Time of second page:  Responding MD:  Dr. Greta Doom  Time MD responded:  [phone]

## 2011-08-07 NOTE — ED Provider Notes (Addendum)
History    This chart was scribed for [employer]. Colon Branch, MD, MD by Smitty Pluck. The patient was seen in room APA14 and the patient's care was started at 12:45PM.   CSN: [id number]  Arrival date & time 08/07/11  1209   First MD Initiated Contact with Patient 08/07/11 1238      Chief Complaint  Patient presents with  . Fatigue    (Consider location/radiation/quality/duration/timing/severity/associated sxs/prior treatment) The history is provided by the patient and a relative.   [name] is a 78 y.o. male who presents to the Emergency Department complaining of moderate fatigue and confused onset today. Symptoms have been constant without radiation. Pt was sent here from Dr. Letitia Neri office. Pt was released from hospital on Tuesday after being admitted for norovirus. Pt has had decreased food intake. Pt denies diarrhea. Pt uses albuterol 2-3x per day.  PCP is [name]  Past Medical History  Diagnosis Date  . COPD (chronic obstructive pulmonary disease)   . Shortness of breath   . Asthma   . DEMENTIA     short term  . Hypertension   . Arthritis   . Hypothyroidism   . CHF (congestive heart failure)     Past Surgical History  Procedure Date  . Repair knee ligament     No family history on file.  History  Substance Use Topics  . Smoking status: Never Smoker   . Smokeless tobacco: Not on file  . Alcohol Use: No      Review of Systems  All other systems reviewed and are negative.   10 Systems reviewed and are negative for acute change except as noted in the HPI.  Allergies  Penicillins and Sulfa antibiotics  Home Medications   Current Outpatient Rx  Name Route Sig Dispense Refill  . ALBUTEROL SULFATE HFA 108 (90 BASE) MCG/ACT IN AERS Inhalation Inhale 2 puffs into the lungs every 6 (six) hours as needed. WHEEZING    . ALBUTEROL SULFATE (2.5 MG/3ML) 0.083% IN NEBU Nebulization Take 2.5 mg by nebulization every 6 (six) hours as needed. For shortness of breath    .  ALPRAZOLAM 0.5 MG PO TABS Oral Take 0.75 mg by mouth at bedtime. Take 1 &1/2 tablets by mouth at bedtime    . ASPIRIN EC 81 MG PO TBEC Oral Take 81 mg by mouth daily.     Marland Kitchen CALCIUM 600/VITAMIN D PO Oral Take 1 tablet by mouth daily.    Marland Kitchen DIGOXIN 0.125 MG PO TABS Oral Take 125 mcg by mouth daily.     Marland Kitchen DILTIAZEM HCL ER BEADS 120 MG PO CP24 Oral Take 120 mg by mouth daily.     . OMEGA-3 FATTY ACIDS 1000 MG PO CAPS Oral Take 1 g by mouth daily.     . FUROSEMIDE 40 MG PO TABS Oral Take 40 mg by mouth daily.     Marland Kitchen GABAPENTIN 600 MG PO TABS Oral Take 600 mg by mouth 4 (four) times daily.     Drema Balzarine HCL 200-25 MG PO CAPS Oral Take 2 tablets by mouth at bedtime. For pain and sleep    . LECITHIN 1200 MG PO CAPS Oral Take 1,200 mg by mouth daily.    Marland Kitchen LEVOTHYROXINE SODIUM 50 MCG PO TABS Oral Take 50 mcg by mouth daily.     Marland Kitchen PAROXETINE HCL 40 MG PO TABS Oral Take 40 mg by mouth at bedtime.     Marland Kitchen POLYETHYLENE GLYCOL 3350 PO POWD Oral Take 17 g  by mouth daily. **MIX ONE CAPFUL IN 8 OUNCES OF WATER/JUICE THEN DRINK DAILY**    . POTASSIUM CHLORIDE ER 10 MEQ PO TBCR Oral Take 10 mEq by mouth daily.     . QUINAPRIL HCL 20 MG PO TABS Oral Take 20 mg by mouth daily.     . SENNA 8.6 MG PO TABS Oral Take 3 tablets by mouth at bedtime.     . TAMSULOSIN HCL 0.4 MG PO CAPS Oral Take 0.4 mg by mouth at bedtime as needed. For bladder control    . VITAMIN C 500 MG PO TABS Oral Take 1,000 mg by mouth daily.     Marland Kitchen VITAMIN E 400 UNITS PO CAPS Oral Take 400 Units by mouth daily.       BP 150/71  Pulse 57  Temp(Src) 97.4 F (36.3 C) (Oral)  Resp 20  SpO2 95%  Physical Exam  Nursing note and vitals reviewed. Constitutional: He is oriented to person, place, and time. He appears well-developed and well-nourished. No distress.  HENT:  Head: Normocephalic and atraumatic.       Periorbital edema   Eyes: Conjunctivae are normal. Pupils are equal, round, and reactive to light.  Neck: Normal range  of motion. Neck supple. JVD present.  Cardiovascular: Normal rate, regular rhythm and normal heart sounds.   Pulmonary/Chest: He has wheezes (throughout bilaterally).  Musculoskeletal: He exhibits edema.  Neurological: He is alert and oriented to person, place, and time.  Skin: Skin is warm and dry.  Psychiatric: He has a normal mood and affect. His behavior is normal.       mildly confused and answering some questions inappropriately     Results for orders placed during the hospital encounter of 08/07/11  CBC      Component Value Range   WBC 7.2  4.0 - 10.5 (K/uL)   RBC 4.17 (*) 4.22 - 5.81 (MIL/uL)   Hemoglobin 12.5 (*) 13.0 - 17.0 (g/dL)   HCT 16.1 (*) 09.6 - 52.0 (%)   MCV 92.6  78.0 - 100.0 (fL)   MCH 30.0  26.0 - 34.0 (pg)   MCHC 32.4  30.0 - 36.0 (g/dL)   RDW 04.5  40.9 - 81.1 (%)   Platelets 164  150 - 400 (K/uL)  DIFFERENTIAL      Component Value Range   Neutrophils Relative 63  43 - 77 (%)   Neutro Abs 4.5  1.7 - 7.7 (K/uL)   Lymphocytes Relative 18  12 - 46 (%)   Lymphs Abs 1.3  0.7 - 4.0 (K/uL)   Monocytes Relative 12  3 - 12 (%)   Monocytes Absolute 0.8  0.1 - 1.0 (K/uL)   Eosinophils Relative 7 (*) 0 - 5 (%)   Eosinophils Absolute 0.5  0.0 - 0.7 (K/uL)   Basophils Relative 1  0 - 1 (%)   Basophils Absolute 0.1  0.0 - 0.1 (K/uL)  BASIC METABOLIC PANEL      Component Value Range   Sodium 139  135 - 145 (mEq/L)   Potassium 3.7  3.5 - 5.1 (mEq/L)   Chloride 102  96 - 112 (mEq/L)   CO2 30  19 - 32 (mEq/L)   Glucose, Bld 98  70 - 99 (mg/dL)   BUN 15  6 - 23 (mg/dL)   Creatinine, Ser 9.14  0.50 - 1.35 (mg/dL)   Calcium 9.9  8.4 - 78.2 (mg/dL)   GFR calc non Af Amer 69 (*) >90 (mL/min)   GFR calc  Af Amer 80 (*) >90 (mL/min)  PRO B NATRIURETIC PEPTIDE      Component Value Range   Pro B Natriuretic peptide (BNP) 3452.0 (*) 0 - 450 (pg/mL)  TROPONIN I      Component Value Range   Troponin I 0.45 (*) <0.30 (ng/mL)  Ct Head Wo Contrast  08/07/2011  *RADIOLOGY  REPORT*  Clinical Data: Confusion.  Possible stroke.  CT HEAD WITHOUT CONTRAST  Technique:  Contiguous axial images were obtained from the base of the skull through the vertex without contrast.  Comparison: 07/16/2011.  Findings: No intracranial hemorrhage.  Remote infarct anterior limb right internal capsule.  Small vessel disease type changes. No CT evidence of large acute infarct.  No intracranial mass lesion detected on this unenhanced exam.  Global atrophy without hydrocephalus.  Vascular calcifications.  IMPRESSION: No acute intracranial abnormality.  Please see above.  Original Report Authenticated By: Fuller Canada, M.D.   Dg Chest Portable 1 View  08/07/2011  *RADIOLOGY REPORT*  Clinical Data: Fatigue and weakness.  PORTABLE CHEST - 1 VIEW  Comparison: 07/23/2011.  Findings: Trachea is midline.  Heart is enlarged, stable.  Thoracic aorta is calcified.  There is thickening of the minor fissure. Mild bibasilar air space disease and small left pleural effusion.  IMPRESSION: Bibasilar air space disease and bilateral pleural effusions.  Original Report Authenticated By: Reyes Ivan, M.D.   ED Course  Procedures (including critical care time) DIAGNOSTIC STUDIES: Oxygen Saturation is 95% on [place], normal by my interpretation.   Marland Kitchentis COORDINATION OF CARE: 1:15PM EDP orders medication: Albuterol 2.5 mg  2:46PM EDP Consult Dr. [name] PCP about pt. Dr [name] requests EDP to get BMP and troponin labs for further evaluation    Date: 08/07/2011  1217  Rate: 51  Rhythm: atrial fibrillation  QRS Axis: left  Intervals: a fib  ST/T Wave abnormalities: normal  Conduction Disutrbances:right bundle branch block  Narrative Interpretation:   Old EKG Reviewed: unchanged c/w 08/02/11  1439   T/C to Dr. [name], case discussed, including:  HPI, pertinent PM/SHx, VS/PE, dx testing, ED course and treatment.  Asked that we obtain troponin and bnp and call him back. 1504 Advised by nursing that BP is lower,  87/40, HR 41. Patient is sleepy, awakens with vigorous stimulation. May have a mild right facial droop. Speech is garbled. Ordered CT scan. [id number] Spoke with Dr. [name]. With elevated troponin and BNP, he wants patient transferred to [facility], Cardiology. 1600 Spoke with Dr. Dietrich Pates, cardiology. She favors conservative treatment at [facility]. No intervention would be needed. Conservative fluid management and serial enzymes. Enzyme is minimally elevated and could be a function of the stress from  the recent GI illness. 4098 Spoke with Dr. [name] who will admit the patient to telemetry. MDM  Patient recently hospitalized for nausea, vomiting, diarrhea with continued weakness. Labs with slightly elevated troponin and elevated DNP. Chest xray with bibasilar airspace disease and pleural effusions.  Admission for serial enzymes, diuresis. Pt stable in ED with no significant deterioration in condition.The patient appears reasonably stabilized for admission considering the current resources, flow, and capabilities available in the ED at this time, and I doubt any other [facility] requiring further screening and/or treatment in the ED prior to admission.  I personally performed the services described in this documentation, which was scribed in my presence. The recorded information has been reviewed and considered.   MDM Reviewed: nursing note, vitals and previous chart Reviewed previous: labs, x-ray and ECG Interpretation: labs,  ECG and x-ray Consults: cardiology and primary care provider         Nicoletta Dress. Colon Branch, MD 08/07/11 1627  Nicoletta Dress. Colon Branch, MD 08/07/11 [id number]

## 2011-08-07 NOTE — Discharge Summary (Signed)
NAME:  [name], [name]                ACCOUNT NO.:  [id number]  MEDICAL RECORD NO.:  [id number]  LOCATION:                                 FACILITY:  PHYSICIAN:  Jacarra Bobak D. Mianna Iezzi, MD   DATE OF BIRTH:  11/28/1917  DATE OF ADMISSION:  08/02/2011 DATE OF DISCHARGE:  [facility]                              DISCHARGE SUMMARY   DISCHARGE DIAGNOSES: 1. Acute gastroenteritis. 2. Mild dehydration. 3. Chronic atrial fibrillation. 4. Dementia. 5. History of bladder wall thickening. 6. Hypothyroidism. 7. Anxiety, depression disorder. 8. Prostatic hypertrophy. 9. Chronic obstructive pulmonary disease.  DISCHARGE MEDICATIONS: 1. Advil PM 200/25 two tablets by mouth at bedtime p.r.n. 2. Albuterol inhaler 2.5 mg via nebulizer q.6 hours p.r.n. 3. ProAir 2 puffs every 6 hours p.r.n. 4. Xanax 0.75 mg at bedtime. 5. Aspirin 81 mg daily. 6. Calcium with vitamin D one tablet daily. 7. Digoxin 0.125 mg daily. 8. Fish oil 1 g daily. 9. Lasix 40 mg daily. 10.Gabapentin 600 mg 4 times daily. 11.Lecithin 1200 mg 1 capsule daily. 12.Levothyroxine 50 mcg daily. 13.Paxil 40 mg daily. 14.MiraLax 17 g daily as needed. 15.KCl 10 mEq daily. 16.Accupril 20 mg daily. 17.Senna 8.6 three tablets at bedtime. 18.Flomax 0.4 mg daily. 19.Diltiazem 120 mg daily. 20.Vitamin C 1000 mg daily. 21.Vitamin E 4000 units daily.  DISPOSITION:  The patient will be discharged back to [facility] in stable condition.  DISCHARGE INSTRUCTIONS:  The patient will be followed in the office in 1 week duration.  He will continue his regular medications.  LABS ON DISCHARGE: Sodium 141, potassium 4.0, chloride 107, carbon dioxide 27, glucose 107, BUN 13, creatinine 0.89, calcium 8.9.  CBC:  WBC 6.7, hemoglobin 11.2, hematocrit 34.6, and platelets 129.  HOSPITAL COURSE:  This is a 78-year-old male patient with history of multiple medical illnesses, was admitted due to nausea, vomiting, and diarrhea.  He was in assisted  living in [facility].  He was admitted as a possible case of gastroenteritis.  The patient was mildly hypotensive while he was initially seen in the emergency room.  The patient received IV fluid and he was rehydrated.  His symptoms were treated with symptomatic medications.  Over the hospital stay, the patient improved.  He was able to tolerate full clear liquid. Currently, no nausea, vomiting, or diarrhea.  The patient is back to his baseline.  He will be discharged to [facility] to continue his regular treatment.     Laddie Math D. Damon Baisch, MD     TDF/MEDQ  D:  08/05/2011  T:  08/05/2011  Job:  [id number] 

## 2011-08-07 NOTE — Progress Notes (Signed)
Writer called MD on call for [name], Dr. Renard Matter.  In ED report writer was told that pt was DNR and upon arrival to floor there was no order. Writer questioned family and pt, verbalized yes that DNR is pt's wishes.  Received new order  And followed.  Writer also reviewed current medication with MD to see if he needed to change any thing due to pt being NPO, MD stated to just hold all po meds until [name] reassesses pt in the morning.

## 2011-08-07 NOTE — ED Notes (Signed)
Pt reports needing to void but unable.  Cath placed per pt request.

## 2011-08-07 NOTE — ED Notes (Signed)
Pt released from hospital on Tuesday after being admitted with noroviurs. Resident of [place]. Sent here from Dr. Letitia Neri office for confusion and weakness. Pt able to answer questions appropriately on arrival to ED.

## 2011-08-07 NOTE — ED Notes (Signed)
At this time, pt is alert to self only, alert to verbal stimuli, unable to answer questions appropriately.  edp made aware.

## 2011-08-07 NOTE — ED Notes (Signed)
Pt drowsy, resting with eyes closed, equal bil chest rise and fall.  Alert to verbal stimuli.  nad noted.

## 2011-08-08 DIAGNOSIS — R748 Abnormal levels of other serum enzymes: Secondary | ICD-10-CM

## 2011-08-08 DIAGNOSIS — I359 Nonrheumatic aortic valve disorder, unspecified: Secondary | ICD-10-CM

## 2011-08-08 DIAGNOSIS — I4891 Unspecified atrial fibrillation: Secondary | ICD-10-CM

## 2011-08-08 LAB — CARDIAC PANEL(CRET KIN+CKTOT+MB+TROPI)
CK, MB: 6.1 ng/mL (ref 0.3–4.0)
CK, MB: 5.3 ng/mL — ABNORMAL HIGH (ref 0.3–4.0)
Relative Index: INVALID (ref 0.0–2.5)
Total CK: 76 U/L (ref 7–232)
Troponin I: 0.39 ng/mL (ref <0.30)

## 2011-08-08 LAB — PRO B NATRIURETIC PEPTIDE: Pro B Natriuretic peptide (BNP): 2072 pg/mL — ABNORMAL HIGH (ref 0–450)

## 2011-08-08 MED ORDER — POLYETHYLENE GLYCOL 3350 17 G PO PACK
17.0000 g | PACK | Freq: Every day | ORAL | Status: DC
  Administered 2011-08-08 – 2011-08-11 (×4): 17 g via ORAL
  Filled 2011-08-08 (×4): qty 1

## 2011-08-08 MED ORDER — GABAPENTIN 300 MG PO CAPS
600.0000 mg | ORAL_CAPSULE | Freq: Four times a day (QID) | ORAL | Status: DC
  Administered 2011-08-08 – 2011-08-11 (×13): 600 mg via ORAL
  Filled 2011-08-08 (×5): qty 2
  Filled 2011-08-08: qty 1
  Filled 2011-08-08 (×4): qty 2
  Filled 2011-08-08: qty 1
  Filled 2011-08-08 (×3): qty 2

## 2011-08-08 MED ORDER — POTASSIUM CHLORIDE CRYS ER 20 MEQ PO TBCR
40.0000 meq | EXTENDED_RELEASE_TABLET | Freq: Every day | ORAL | Status: DC
  Administered 2011-08-08 – 2011-08-11 (×4): 40 meq via ORAL
  Filled 2011-08-08 (×4): qty 2

## 2011-08-08 MED ORDER — FUROSEMIDE 10 MG/ML IJ SOLN
40.0000 mg | Freq: Two times a day (BID) | INTRAMUSCULAR | Status: DC
  Administered 2011-08-08 – 2011-08-09 (×3): 40 mg via INTRAVENOUS
  Filled 2011-08-08 (×3): qty 4

## 2011-08-08 NOTE — Progress Notes (Signed)
*  PRELIMINARY RESULTS* Echocardiogram 2D Echocardiogram has been performed.  Conrad Le [name] 08/08/2011, 11:35 AM

## 2011-08-08 NOTE — Evaluation (Signed)
SPEECH LANGUAGE PATHOLOGY Clinical/Bedside Swallow Evaluation Patient Details  Name: [name] MRN: [id number] DOB: 12/17/1917 Today's Date: 08/08/2011  Past Medical History:  Past Medical History  Diagnosis Date  . COPD (chronic obstructive pulmonary disease)   . Shortness of breath   . Asthma   . DEMENTIA     short term  . Hypertension   . Arthritis   . Hypothyroidism   . CHF (congestive heart failure)    Past Surgical History:  Past Surgical History  Procedure Date  . Repair knee ligament    HPI:  78 year old male rest home resident admitted 08/07/11 due to generalized weakness and AMS.  PMH significant for recent gastroenteritis, chronic AFib, dementia, hypothyroid, anxiety and depression, prostatic hypertrophy, COPD. Pt exhibits a congested productive cough.  Lungs are congested per RN, and pt is afebrile.   Assessment/Recommendations/Treatment Plan   SLP Assessment Clinical Impression Statement: Pt exhibits no overt s/s aspiration, however, he does demonstrate a congested, productive cough when not taking po's.  Pt has a history of COPD, which increases the risk of silent aspiration.  In addition, he has a diagnosis of dementia, which increases likelihood of dysphagia as it progresses.  Silent aspiration cannot be detected at bedside, therefore, objective study via Modified Barium Swallow  is highly recommended.  MD was contacted by phone, and reported wanting to hold on completion of MBS at this time, and to observe pt mental status and po tolerance over the weekend.  ST to follow up Monday 08/11/11 for assessment of diet tolerance, and proceed as indicated.  RN informed of MD decision. Risk for Aspiration: Moderate (dx COPD, dementia, advanced age.) Other Related Risk Factors: Cognitive impairment (COPD)  Swallow Evaluation Recommendations Recommended Consults: MBS Solid Consistency: Dysphagia 3 (Mechanical soft) Liquid Consistency: Thin Liquid Administration via:  Straw;Cup Medication Administration: Whole meds with liquid Supervision: Patient able to self feed Compensations: Slow rate;Small sips/bites Postural Changes and/or Swallow Maneuvers: Seated upright 90 degrees Oral Care Recommendations: Oral care BID Follow up Recommendations: Other (comment) (next level of care)  Treatment Plan Treatment Plan Recommendations: Therapy as outlined in treatment plan below Speech Therapy Frequency: min 1 x/week Treatment Duration: 1 week Interventions: Diet toleration management by SLP;Patient/family education  Prognosis Prognosis for Safe Diet Advancement: Good Barriers to Reach Goals: Cognitive deficits  Individuals Consulted Consulted and Agree with Results and Recommendations: Patient;MD;RN  Swallowing Goals  SLP Swallowing Goals Patient will consume recommended diet without observed clinical signs of aspiration with: Modified independent assistance Swallow Study Goal #1 - Progress: Progressing toward goal Patient will utilize recommended strategies during swallow to increase swallowing safety with: Modified independent assistance Swallow Study Goal #2 - Progress: Progressing toward goal  General  HPI: 78 year old male rest home resident admitted 08/07/11 due to generalized weakness and AMS.  PMH significant for recent gastroenteritis, chronic AFib, dementia, hypothyroid, anxiety and depression, prostatic hypertrophy, COPD. Pt exhibits a congested productive cough.  Lungs are congested per RN, and pt is afebrile. Type of Study: Bedside swallow evaluation Diet Prior to this Study: Dysphagia 3 (soft);Thin liquids Temperature Spikes Noted: No Respiratory Status: Other (comment) ( @ 2L) History of Intubation: No Behavior/Cognition: Alert;Cooperative Oral Cavity - Dentition: Adequate natural dentition Vision: Functional for self-feeding Patient Positioning: Upright in bed Baseline Vocal Quality: Clear Volitional Cough: Strong Volitional Swallow:  Able to elicit  Oral Motor/Sensory Function  Overall Oral Motor/Sensory Function: Appears within functional limits for tasks assessed  Consistency Results  Ice Chips Ice chips: Not  tested  Thin Liquid Thin Liquid: Within functional limits Presentation: Straw;Self Fed  Nectar Thick Liquid Nectar Thick Liquid: Not tested  Honey Thick Liquid Honey Thick Liquid: Not tested  Puree Puree: Within functional limits Presentation: Self Fed;Spoon  Solid Solid: Within functional limits Presentation: Self Fed;Spoon  [name] B. [place], MSP, CCC-SLP [phone]    Leigh Aurora 08/08/2011,1:42 PM

## 2011-08-08 NOTE — Progress Notes (Signed)
Received a call from [facility] in the lab that patient's Troponin was 0.342. Paged Dr. [name].

## 2011-08-08 NOTE — Consult Note (Signed)
CARDIOLOGY CONSULT NOTE  Patient IDDavell [name] MRN: [id number] DOB/AGE: Mar 22, 1918 78 y.o.  Admit date: 08/07/2011 Referring Physician: Tonny Bollman, MD, MD Primary Cardiologist: Epimenio Foot, Maisie Fus Reason for Consultation: Atrial fib and positive Cardiac enzymes. Active Problems:  * No active hospital problems. *   HPI: Mr. 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 He was recently discharged one week ago after admission for norovirus. He is a resident of [facility]. On arrival in the emergency room he was found to have an elevated BNP of 3452. He had positive cardiac enzymes at 0.4-7:0.39; and 0.34; respectively. He was also found to be in A. fib with RVR with a rate in the high 90s. He has a history of atrial fibrillation, chronically, dementia, hypothyroidism, anxiety, and depression,  COPD with chronic shortness of breath and cough. Chest x-ray showed no evidence of CHF, he had mild bibasilar atelectasis with stable cardiomegaly. Admission EKG revealed slow atrial fibrillation with a rate in of 70 with incomplete right bundle branch block and frequent PVCs. He was admitted for treatment. We are asked for recommendations concerning elevated troponins and BNP.    He is a poor historian concerning his health history, including cardiac history. He is unable to tell me the name of his cardiologist or if he has ever been told he was in atrial fibrillation in the past. All records are providing most of this history. He denies any chest pain or shortness of breath, however, he complains of chronic coughing, and which is productive. He has had some occasional shortness of breath, getting up out of the bed, however, he remains very sedentary.  Review of systems complete and found to be negative unless listed above   Past Medical History  Diagnosis Date  .  COPD (chronic obstructive pulmonary disease)   . Shortness of breath   . Asthma   . DEMENTIA     short term  . Hypertension   . Arthritis   . Hypothyroidism   . CHF (congestive heart failure)     History reviewed. No pertinent family history.  History   Social History  . Marital Status: Single    Spouse Name: N/A    Number of Children: N/A  . Years of Education: N/A   Occupational History  . Not on file.   Social History Main Topics  . Smoking status: Never Smoker   . Smokeless tobacco: Not on file  . Alcohol Use: No  . Drug Use:   . Sexually Active: No   Other Topics Concern  . Not on file   Social History Narrative  . No narrative on file    Past Surgical History  Procedure Date  . Repair knee ligament      Prescriptions prior to admission  Medication Sig Dispense Refill  . ALPRAZolam (XANAX) 0.5 MG tablet Take 0.75 mg by mouth at bedtime. Take 1 &1/2 tablets by mouth at bedtime      . aspirin EC 81 MG tablet Take 81 mg by mouth daily.       . Calcium Carbonate-Vitamin D (CALCIUM 600/VITAMIN D PO) Take 1 tablet by mouth daily.      . digoxin (LANOXIN) 0.125 MG tablet Take 125 mcg by mouth daily.       Marland Kitchen diltiazem (TAZTIA XT) 120 MG 24 hr capsule Take 120 mg by mouth daily.       Marland Kitchen  fish oil-omega-3 fatty acids 1000 MG capsule Take 1 g by mouth daily.       . furosemide (LASIX) 40 MG tablet Take 40 mg by mouth daily.       Marland Kitchen gabapentin (NEURONTIN) 600 MG tablet Take 600 mg by mouth 4 (four) times daily.       . Ibuprofen-Diphenhydramine HCl (ADVIL PM) 200-25 MG CAPS Take 2 tablets by mouth at bedtime. For pain and sleep      . Lecithin 1200 MG CAPS Take 1,200 mg by mouth daily.      Marland Kitchen levothyroxine (SYNTHROID, LEVOTHROID) 50 MCG tablet Take 50 mcg by mouth daily.       Marland Kitchen PARoxetine (PAXIL) 40 MG tablet Take 40 mg by mouth at bedtime.       . polyethylene glycol powder (GLYCOLAX/MIRALAX) powder Take 17 g by mouth daily. **MIX ONE CAPFUL IN 8 OUNCES OF WATER/JUICE  THEN DRINK DAILY**      . potassium chloride (K-DUR) 10 MEQ tablet Take 10 mEq by mouth daily.       . quinapril (ACCUPRIL) 20 MG tablet Take 20 mg by mouth daily.       Marland Kitchen senna (SENOKOT) 8.6 MG TABS Take 3 tablets by mouth at bedtime.       . vitamin C (ASCORBIC ACID) 500 MG tablet Take 1,000 mg by mouth daily.       . vitamin E (VITAMIN E) 400 UNIT capsule Take 400 Units by mouth daily.         Physical Exam: Blood pressure 116/75, pulse 70, temperature 97.4 F (36.3 C), temperature source Oral, resp. rate 18, height 5\' 9"  (1.753 m), weight 190 lb 14.7 oz (86.6 kg), SpO2 93.00%.   General: Well developed, well nourished, in no acute distress, confused. Head: Eyes PERRLA, No xanthomas.   Normal cephalic and atramatic  Lungs: Bibasilar rales with inspiratory wheezes, prolonged expiratory phase. Heart: HRIR S1 S2,Soft systolic murmur. Heart sounds are obscured by lung sounds.  Pulses are 2+ & equal.            No carotid bruit. No JVD.  No abdominal bruits. No femoral bruits. Abdomen: Bowel sounds are positive, abdomen soft and non-tender without masses or                  Hernia's noted. Msk:  Back normal, normal gait.Diminished strength and tone for age. Extremities: Positive for clubbing, no cyanosis. Minimal edema.  DP +1 Neuro: Alert and pleasantly confused.  Psych:  Good affect, responsive  Labs:   Lab Results  Component Value Date   WBC 7.2 08/07/2011   HGB 12.5* 08/07/2011   HCT 38.6* 08/07/2011   MCV 92.6 08/07/2011   PLT 164 08/07/2011    Lab 08/07/11 1313 08/02/11 1017  NA 139 --  K 3.7 --  CL 102 --  CO2 30 --  BUN 15 --  CREATININE 0.99 --  CALCIUM 9.9 --  PROT -- 6.9  BILITOT -- 0.9  ALKPHOS -- 54  ALT -- 18  AST -- 41*  GLUCOSE 98 --   Lab Results  Component Value Date   CKTOTAL 76 08/08/2011   CKMB 5.3* 08/08/2011   TROPONINI 0.34* 08/08/2011        Radiology: Ct Head Wo Contrast  08/07/2011  *RADIOLOGY REPORT*  Clinical Data: Confusion.  Possible  stroke.  CT HEAD WITHOUT CONTRAST  Technique:  Contiguous axial images were obtained from the base of the skull through the vertex without  contrast.  Comparison: 07/16/2011.  Findings: No intracranial hemorrhage.  Remote infarct anterior limb right internal capsule.  Small vessel disease type changes. No CT evidence of large acute infarct.  No intracranial mass lesion detected on this unenhanced exam.  Global atrophy without hydrocephalus.  Vascular calcifications.  IMPRESSION: No acute intracranial abnormality.  Please see above.  Original Report Authenticated By: Fuller Canada, M.D.   Dg Chest Portable 1 View  08/07/2011  *RADIOLOGY REPORT*  Clinical Data: Fatigue and weakness.  PORTABLE CHEST - 1 VIEW  Comparison: 07/23/2011.  Findings: Trachea is midline.  Heart is enlarged, stable.  Thoracic aorta is calcified.  There is thickening of the minor fissure. Mild bibasilar air space disease and small left pleural effusion.  IMPRESSION: Bibasilar air space disease and bilateral pleural effusions.  Original Report Authenticated By: Reyes Ivan, M.D.   ZOX:WRUEAV fib with rate of 52, Incomplete RBBB.  ASSESSMENT AND PLAN:  1. Atrial fibrillation: Rate controlled to bradycardic on cardizem 120 mg daily and digoxin 125 mcg daily. Digoxin level is 0.9 normal.  Not a coumadin candidate. If HR becomes lower than 50, persistently, will need to stop digoxn  Awaiting echo results.  2. Positive Cardiac Enzymes: Slight troponin leak from acute illness. This may be from recent norovirus illness 5 days prior . No significant EKG changes indicative of ischemia.  He denies chest pain. At his age and co-morbidities would not proceed with aggressive testing or intervention. Medical management only is recommended.   3. CHF: BNP can be chronically elevated in those with COPD. He did diurese from IV lasix. With recent dehydrating illness would not over diurese. Will change back to po lasix from IV to avoid this in am  . Echo will give [place] a better view of his LV fx to make decisions on aggressiveness of continued diureses.   Signed: Bettey Mare. Lyman Bishop NP [facility] 08/08/2011, 12:38 PM Co-Sign MD I have taken a history, reviewed medications, allergies, PMH, SH, FH, and reviewed ROS and examined the patient.  I agree with the assessment and plan. Echo pending, no further cardiac recommendations at this time. Regardless of Echo findings, conservative management recommended.  Jeet Shough C. Daleen Squibb, MD, [facility] Pager:  [phone]

## 2011-08-08 NOTE — Consult Note (Signed)
Reason for Consult: Referring Physician:   [name] is an 78 y.o. male.  HPI:  Past Medical History  Diagnosis Date  . COPD (chronic obstructive pulmonary disease)   . Shortness of breath   . Asthma   . DEMENTIA     short term  . Hypertension   . Arthritis   . Hypothyroidism   . CHF (congestive heart failure)     Past Surgical History  Procedure Date  . Repair knee ligament     History reviewed. No pertinent family history.  Social History:  reports that he has never smoked. He does not have any smokeless tobacco history on file. He reports that he does not drink alcohol. His drug history not on file.  Allergies:  Allergies  Allergen Reactions  . Codeine   . Penicillins Other (See Comments)    Unknown reactions   . Sulfa Antibiotics Other (See Comments)    Unknown reaction    Medications:  Prior to Admission medications   Medication Sig Start Date End Date Taking? Authorizing Provider  ALPRAZolam Prudy Feeler) 0.5 MG tablet Take 0.75 mg by mouth at bedtime. Take 1 &1/2 tablets by mouth at bedtime   Yes Historical Provider, MD  aspirin EC 81 MG tablet Take 81 mg by mouth daily.    Yes Historical Provider, MD  Calcium Carbonate-Vitamin D (CALCIUM 600/VITAMIN D PO) Take 1 tablet by mouth daily.   Yes Historical Provider, MD  digoxin (LANOXIN) 0.125 MG tablet Take 125 mcg by mouth daily.    Yes Historical Provider, MD  diltiazem (TAZTIA XT) 120 MG 24 hr capsule Take 120 mg by mouth daily.    Yes Historical Provider, MD  fish oil-omega-3 fatty acids 1000 MG capsule Take 1 g by mouth daily.    Yes Historical Provider, MD  furosemide (LASIX) 40 MG tablet Take 40 mg by mouth daily.    Yes Historical Provider, MD  gabapentin (NEURONTIN) 600 MG tablet Take 600 mg by mouth 4 (four) times daily.    Yes Historical Provider, MD  Ibuprofen-Diphenhydramine HCl (ADVIL PM) 200-25 MG CAPS Take 2 tablets by mouth at bedtime. For pain and sleep   Yes Historical Provider, MD  Lecithin 1200  MG CAPS Take 1,200 mg by mouth daily.   Yes Historical Provider, MD  levothyroxine (SYNTHROID, LEVOTHROID) 50 MCG tablet Take 50 mcg by mouth daily.    Yes Historical Provider, MD  PARoxetine (PAXIL) 40 MG tablet Take 40 mg by mouth at bedtime.    Yes Historical Provider, MD  polyethylene glycol powder (GLYCOLAX/MIRALAX) powder Take 17 g by mouth daily. **MIX ONE CAPFUL IN 8 OUNCES OF WATER/JUICE THEN DRINK DAILY**   Yes Historical Provider, MD  potassium chloride (K-DUR) 10 MEQ tablet Take 10 mEq by mouth daily.    Yes Historical Provider, MD  quinapril (ACCUPRIL) 20 MG tablet Take 20 mg by mouth daily.    Yes Historical Provider, MD  senna (SENOKOT) 8.6 MG TABS Take 3 tablets by mouth at bedtime.    Yes Historical Provider, MD  vitamin C (ASCORBIC ACID) 500 MG tablet Take 1,000 mg by mouth daily.    Yes Historical Provider, MD  vitamin E (VITAMIN E) 400 UNIT capsule Take 400 Units by mouth daily.    Yes Historical Provider, MD   Scheduled Meds:   . albuterol  2.5 mg Nebulization Once  . antiseptic oral rinse  15 mL Mouth Rinse q12n4p  . aspirin EC  81 mg Oral Daily  . calcium-vitamin  D  1 tablet Oral Daily  . chlorhexidine  15 mL Mouth Rinse BID  . digoxin  125 mcg Oral Daily  . diltiazem  120 mg Oral Daily  . enoxaparin (LOVENOX) injection  40 mg Subcutaneous Q24H  . furosemide  40 mg Intravenous BID  . gabapentin  600 mg Oral QID  . levothyroxine  50 mcg Oral Daily  . omega-3 acid ethyl esters  1 g Oral Daily  . PARoxetine  40 mg Oral QHS  . polyethylene glycol  17 g Oral Daily  . potassium chloride  40 mEq Oral Daily  . senna  3 tablet Oral QHS  . sodium chloride  250 mL Intravenous Once  . vitamin C  1,000 mg Oral Daily  . vitamin E  400 Units Oral Daily  . DISCONTD: furosemide  40 mg Oral Daily  . DISCONTD: gabapentin  600 mg Oral QID  . DISCONTD: polyethylene glycol powder  17 g Oral Daily  . DISCONTD: potassium chloride  10 mEq Oral Daily   Continuous Infusions:   .  dextrose 50 mL/hr at 08/07/11 2317   PRN Meds:.   Results for orders placed during the hospital encounter of 08/07/11 (from the past 48 hour(s))  CBC     Status: Abnormal   Collection Time   08/07/11  1:13 PM      Component Value Range Comment   WBC 7.2  4.0 - 10.5 (K/uL)    RBC 4.17 (*) 4.22 - 5.81 (MIL/uL)    Hemoglobin 12.5 (*) 13.0 - 17.0 (g/dL)    HCT 02.7 (*) 25.3 - 52.0 (%)    MCV 92.6  78.0 - 100.0 (fL)    MCH 30.0  26.0 - 34.0 (pg)    MCHC 32.4  30.0 - 36.0 (g/dL)    RDW 66.4  40.3 - 47.4 (%)    Platelets 164  150 - 400 (K/uL)   DIFFERENTIAL     Status: Abnormal   Collection Time   08/07/11  1:13 PM      Component Value Range Comment   Neutrophils Relative 63  43 - 77 (%)    Neutro Abs 4.5  1.7 - 7.7 (K/uL)    Lymphocytes Relative 18  12 - 46 (%)    Lymphs Abs 1.3  0.7 - 4.0 (K/uL)    Monocytes Relative 12  3 - 12 (%)    Monocytes Absolute 0.8  0.1 - 1.0 (K/uL)    Eosinophils Relative 7 (*) 0 - 5 (%)    Eosinophils Absolute 0.5  0.0 - 0.7 (K/uL)    Basophils Relative 1  0 - 1 (%)    Basophils Absolute 0.1  0.0 - 0.1 (K/uL)   BASIC METABOLIC PANEL     Status: Abnormal   Collection Time   08/07/11  1:13 PM      Component Value Range Comment   Sodium 139  135 - 145 (mEq/L)    Potassium 3.7  3.5 - 5.1 (mEq/L)    Chloride 102  96 - 112 (mEq/L)    CO2 30  19 - 32 (mEq/L)    Glucose, Bld 98  70 - 99 (mg/dL)    BUN 15  6 - 23 (mg/dL)    Creatinine, Ser 2.59  0.50 - 1.35 (mg/dL)    Calcium 9.9  8.4 - 10.5 (mg/dL)    GFR calc non Af Amer 69 (*) >90 (mL/min)    GFR calc Af Amer 80 (*) >90 (mL/min)  PRO B NATRIURETIC PEPTIDE     Status: Abnormal   Collection Time   08/07/11  1:13 PM      Component Value Range Comment   Pro B Natriuretic peptide (BNP) 3452.0 (*) 0 - 450 (pg/mL)   TROPONIN I     Status: Abnormal   Collection Time   08/07/11  1:13 PM      Component Value Range Comment   Troponin I 0.45 (*) <0.30 (ng/mL)   MRSA PCR SCREENING     Status: Normal    Collection Time   08/07/11  6:45 PM      Component Value Range Comment   MRSA by PCR NEGATIVE  NEGATIVE    CARDIAC PANEL(CRET KIN+CKTOT+MB+TROPI)     Status: Abnormal   Collection Time   08/07/11  7:00 PM      Component Value Range Comment   Total CK 132  7 - 232 (U/L)    CK, MB 6.8 (*) 0.3 - 4.0 (ng/mL)    Troponin I 0.42 (*) <0.30 (ng/mL)    Relative Index 5.2 (*) 0.0 - 2.5    CARDIAC PANEL(CRET KIN+CKTOT+MB+TROPI)     Status: Abnormal   Collection Time   08/08/11  2:46 AM      Component Value Range Comment   Total CK 96  7 - 232 (U/L)    CK, MB 6.1 (*) 0.3 - 4.0 (ng/mL)    Troponin I 0.39 (*) <0.30 (ng/mL)    Relative Index RELATIVE INDEX IS INVALID  0.0 - 2.5      Ct Head Wo Contrast  08/07/2011  *RADIOLOGY REPORT*  Clinical Data: Confusion.  Possible stroke.  CT HEAD WITHOUT CONTRAST  Technique:  Contiguous axial images were obtained from the base of the skull through the vertex without contrast.  Comparison: 07/16/2011.  Findings: No intracranial hemorrhage.  Remote infarct anterior limb right internal capsule.  Small vessel disease type changes. No CT evidence of large acute infarct.  No intracranial mass lesion detected on this unenhanced exam.  Global atrophy without hydrocephalus.  Vascular calcifications.  IMPRESSION: No acute intracranial abnormality.  Please see above.  Original Report Authenticated By: Fuller Canada, M.D.   Dg Chest Portable 1 View  08/07/2011  *RADIOLOGY REPORT*  Clinical Data: Fatigue and weakness.  PORTABLE CHEST - 1 VIEW  Comparison: 07/23/2011.  Findings: Trachea is midline.  Heart is enlarged, stable.  Thoracic aorta is calcified.  There is thickening of the minor fissure. Mild bibasilar air space disease and small left pleural effusion.  IMPRESSION: Bibasilar air space disease and bilateral pleural effusions.  Original Report Authenticated By: Reyes Ivan, M.D.    Review of Systems  Unable to perform ROS: mental status change   Blood pressure  116/75, pulse 70, temperature 97.4 F (36.3 C), temperature source Oral, resp. rate 18, height 5\' 9"  (1.753 m), weight 86.6 kg (190 lb 14.7 oz), SpO2 93.00%. Physical Exam  Assessment/Plan: See dictation  [name] 08/08/2011, 8:23 AM

## 2011-08-08 NOTE — Progress Notes (Signed)
   CARE MANAGEMENT NOTE 08/08/2011  Patient:  [name],[name]   Account Number:  [id number]  Date Initiated:  08/08/2011  Documentation initiated by:  Sharrie Rothman  Subjective/Objective Assessment:   Pt admitted with probable CHF and dementia. Pt resident of [facility]. Will return at discharge.     Action/Plan:   CM spoke with pt , but due to dementia, pt unable to make decision about HH needs. Will discuss with son. CSW aware of pt admission.   Anticipated DC Date:  08/15/2011   Anticipated DC Plan:  ASSISTED LIVING / REST HOME  In-house referral  Clinical Social Worker      DC Planning Services  CM consult      Choice offered to / List presented to:             Status of service:  In process, will continue to follow Medicare Important Message given?   (If response is "NO", the following Medicare IM given date fields will be blank) Date Medicare IM given:   Date Additional Medicare IM given:    Discharge Disposition:  ASSISTED LIVING  Per UR Regulation:    If discussed at Long Length of Stay Meetings, dates discussed:    Comments:  08/08/11 1500 Arlyss Queen, RN BSN CM Pt admitted from [facility]. WIll return to [facility] at discharge. Will monitor for discharge needs.

## 2011-08-08 NOTE — Consult Note (Signed)
NAMEGENTLE, HOGE                ACCOUNT NO.:  [id number]  MEDICAL RECORD NO.:  [id number]  LOCATION:  A338                          FACILITY:  [facility]  PHYSICIAN:  Staceyann Knouff A. Gerilyn Pilgrim, M.D. DATE OF BIRTH:  [birth year]  DATE OF CONSULTATION: DATE OF DISCHARGE:                                CONSULTATION   The patient is a 78 year old white male who has baseline dementia.  He is at nursing home facility, presents with what appears to be gastroenteritis symptoms, nausea, vomiting, and diarrhea.  He was hypotensive in the emergency room of 80/65, also has had low-grade fever 100.2.  He continued to have bouts of nausea, vomiting, diarrhea while hospitalized although this has improved.  He appears to have had increasing confusion at the nursing facility which has persisted here and had neurological consultation.  The patient cannot give adequate history as to why he is here.  He thinks he has had pneumonia.  History is very limited given confusion and baseline dementia.  Workup again significant for elevated pro B natriuretic peptide.  He does have a history of congestive heart failure.  Head CT scan showed global atrophy but nothing acute.  PHYSICAL EXAMINATION:  VITAL SIGNS:  Normal weight, no acute distress. HEENT:  Head is normocephalic, atraumatic. NECK:  Supple. ABDOMEN:  Soft. EXTREMITIES:  1+ pitting edema of the legs. ABDOMEN:  Soft. NEUROLOGIC:  Mentation:  He is awake.  He knows he is in the hospital, not oriented to time, not oriented to medical history, unable to provide adequate history as to why he is here.  He does follow commands.  Speech is normal.  Cranial nerve evaluation shows pupils are 4 mm, reactive. Extraocular movements are full.  No nystagmus is seen.  Visual fields appears to be intact.  Facial muscle strength is symmetric.  Tongue and uvula are both midline.  Shoulder shrugs normal.  Motor examination shows normal tone, bulk, and strength throughout.  No  pronator drift. Coordination shows no dysmetria or tremors, past pointing or parkinsonism.  Reflexes are 2+.  Plantars are both downgoing.  Sensation is somewhat unreliable, though he responds to painful stimuli bilaterally.  ASSESSMENT:  Baseline dementia.  I suspect of the Alzheimer's type, seemed to have had some worsening due to dehydration, congestive heart failure, gastroenteritis.  RECOMMENDATIONS:  Continue with current care.  Dementia labs will be obtained.  Also check a urinalysis.     Dennies Coate A. Gerilyn Pilgrim, M.D.     KAD/MEDQ  D:  08/08/2011  T:  08/08/2011  Job:  [id number]

## 2011-08-08 NOTE — Plan of Care (Signed)
Problem: Phase I Progression Outcomes Goal: Other Phase I Outcomes/Goals Outcome: Progressing Tolerance of dys 3/thin liquid diet. [name], MSP, CCC-SLP [phone]

## 2011-08-08 NOTE — Progress Notes (Signed)
Brief Nutrition Note  Pt identified on nutrition risk screen due to dysphagia (difficulty swallowing liquids). Reviewed records from [facility], where pt resides; no modified diet noted. Pt pleasantly confused at time of visit. Pt granddaughter and RN provided hx. Noted SLP evaluated earlier today; pt is at risk for silent aspiration due to hx of COPD, but MD ordered to hold off on MBSS until after the weekend to observe PO intake and mental status. Pt is currently on a dysphagia 3 diet with thin liquids. RN reports dysphagia of liquids likely due to drowsiness upon admission. No nutritional concerns at this time. Continue current nutrition plan of care. Will follow peripherally for tolerance of PO diet and PO intake.     Altamese Cabal, RD, LDN Pager: [phone]

## 2011-08-08 NOTE — Progress Notes (Signed)
[name], [name]                ACCOUNT NO.:  [id number]  MEDICAL RECORD NO.:  [id number]  LOCATION:  A338                          FACILITY:  [facility]  PHYSICIAN:  Marian Grandt D. [name], MD   DATE OF BIRTH:  1917-12-14  DATE OF PROCEDURE: DATE OF DISCHARGE:                                PROGRESS NOTE   SUBJECTIVE:  The patient feels better.  His confusion and change in mental status is improving.  No chest pain, cough, or shortness of breath.  OBJECTIVE:  GENERAL:  Patient is more alert, awake, and responding appropriately to verbal communication. VITAL SIGNS:  Blood pressure 150/71, pulse 70, respiratory rate 20, temperature 98.7 degrees Fahrenheit. CHEST:  Decreased air entry, few rhonchi. CARDIOVASCULAR SYSTEM:  First and second heart sounds heard. Irregularly irregular. ABDOMEN:  Soft and lax.  Bowel sound is positive.  No mass or organomegaly. EXTREMITIES:  2+ edema.  LABORATORY DATA:  CPK total 96, CK-MB is 6.1, troponin is 0.39.  ASSESSMENT: 1. Change in mental status, clinically improving. 2. Probably congestive heart failure. 3. Elevated troponin secondary to the above. 4. Dementia. 5. Chronic atrial fibrillation.  PLAN:  We will do echocardiogram.  We will start on IV diuretics.  We will monitor his BNP and Cardiology consult is pending.     Seylah Wernert D. [name], MD     TDF/MEDQ  D:  08/08/2011  T:  08/08/2011  Job:  [id number]

## 2011-08-08 NOTE — H&P (Signed)
NAMETOLLIE, [place]NA[place]                ACCOUNT NO.:  [id number]  MEDICAL RECORD NO.:  [id number]  LOCATION:  A338                          FACILITY:  [facility]  PHYSICIAN:  Lynnsey Barbara D. [name], MD   DATE OF BIRTH:  [birth year]  DATE OF ADMISSION:  08/07/2011 DATE OF DISCHARGE:  LH                             HISTORY & PHYSICAL   CHIEF COMPLAINT:  Generalized weakness and a change in mental status.  HISTORY OF PRESENT ILLNESS:  This is a 78 year old male patient with history of multiple medical illnesses, who was recently discharged from this hospital after he was treated for acute gastroenteritis, was brought to the office yesterday from assisted living due to change in mental status and generalized weakness.  According to the rest home staff, the patient was very sleepy, unable to wake up and even take his medications.  He was more confused and disoriented.  He has some puffiness of his eyelid and face.  He was seen in the office and the patient was acutely sick looking and very confused.  He was referred to emergency room for evaluation.  The patient was seen in emergency room, and evaluation was done.  CT scan was negative.  However, his BNP was elevated and his troponin was also slightly elevated.  The patient also has mild hypotension intermittently.  His EKG showed an atrial fibrillation, rate below 100.  The patient anyhow was admitted under telemetry to continue further evaluation and treatment.  REVIEW OF SYSTEMS:  The patient is confused and disoriented, unable to give history.  PAST MEDICAL HISTORY: 1. Chronic atrial fibrillation. 2. Dementia. 3. Hypothyroidism. 4. Anxiety/depression disorder. 5. Prostatic hypertrophy. 6. Chronic obstructive pulmonary disease. 7. History of recent gastroenteritis.  CURRENT MEDICATIONS: 1. Albuterol inhaler q.6 hours. 2. Xanax 0.75 at bedtime. 3. Aspirin 81 mg daily. 4. Calcium with vitamin D 1 tablet daily. 5. Digoxin 0.125 mg daily. 6.  Lasix 40 mg daily. 7. Gabapentin 600 mg t.i.d. 8. Lecithin 1200 mg daily. 9. Levothyroxine 50 mcg daily. 10.Paxil 40 mg daily. 11.MiraLAX 17 g daily. 12.KCl 10 mEq daily. 13.Accupril 20 mg daily. 14.Senna S3 tablets at bedtime. 15.Flomax 0.4 mg daily. 16.Diltiazem 120 mg daily.  SOCIAL HISTORY:  The patient is a resident of [facility]  No history of alcohol, tobacco, or substance abuse.  FAMILY HISTORY:  Not available at this time.  PHYSICAL EXAMINATION:  GENERAL:  The patient is alert, awake, confused and disoriented. VITAL SIGNS:  Blood pressure 150/71, pulse 70, respiratory rate 20, temperature 97.6 degrees Fahrenheit. HEENT:  Pupils are equal, reactive. NECK:  Supple. CHEST:  Decreased air entry, few rhonchi. CARDIOVASCULAR SYSTEM:  First and second heart sound heard.  Irregularly irregular.  No murmur.  No gallop. ABDOMEN:  Soft, and lax.  Bowel sound is positive.  No mass or organomegaly. EXTREMITIES:  No leg edema.  LABS ON ADMISSION:  BNP 3452.  Sodium 139, potassium 3.4, chloride 102, glucose 98, carbon dioxide 30, BUN 15, creatinine 0.9, calcium 9.9. CBC:  WBC 7.2, hemoglobin 12.5, hematocrit 38.6, platelets 169.  CPK total 132, CK-MB 6.8, troponin 0.42.  ASSESSMENT: 1. Change in mental status, etiology not clear, however, could  be     secondary to multifactorial metabolic causes. 2. Elevated BNP, which could be secondary to congestive heart failure. 3. Slightly elevated troponin, may be related to his heart failure. 4. Chronic atrial fibrillation with controlled rate. 5. Dementia. 6. History of benign prostatic hypertrophy. 7. Anxiety/depression disorder.  PLAN:  We will admit the patient under telemetry.  We will do serial EKG and cardiac enzymes.  We will do echocardiogram.  We will start the patient on IV diuretics if his blood pressure remained above 100.  We will do Cardiology consult and Neurology consult.     Tationna Fullard D. [name],  MD     TDF/MEDQ  D:  08/08/2011  T:  08/08/2011  Job:  [id number]

## 2011-08-08 NOTE — Progress Notes (Signed)
Dr. [name] informed over phone that patient's troponin is 0.342. Dr. [name] stated it has went down.

## 2011-08-09 LAB — BASIC METABOLIC PANEL
BUN: 8 mg/dL (ref 6–23)
CO2: 36 mEq/L — ABNORMAL HIGH (ref 19–32)
Chloride: 97 mEq/L (ref 96–112)
Glucose, Bld: 118 mg/dL — ABNORMAL HIGH (ref 70–99)
Potassium: 3.3 mEq/L — ABNORMAL LOW (ref 3.5–5.1)

## 2011-08-09 LAB — URINALYSIS, ROUTINE W REFLEX MICROSCOPIC
Glucose, UA: NEGATIVE mg/dL
Ketones, ur: NEGATIVE mg/dL
Leukocytes, UA: NEGATIVE
Nitrite: NEGATIVE
Specific Gravity, Urine: <1.005 — ABNORMAL LOW (ref 1.005–1.030)
pH: 6 (ref 5.0–8.0)

## 2011-08-09 LAB — CARDIAC PANEL(CRET KIN+CKTOT+MB+TROPI)
CK, MB: 3.6 ng/mL (ref 0.3–4.0)
Troponin I: <0.3 ng/mL (ref <0.30)

## 2011-08-09 LAB — URINE MICROSCOPIC-ADD ON

## 2011-08-09 MED ORDER — ALBUTEROL SULFATE (5 MG/ML) 0.5% IN NEBU
2.5000 mg | INHALATION_SOLUTION | Freq: Four times a day (QID) | RESPIRATORY_TRACT | Status: DC | PRN
  Administered 2011-08-09 – 2011-08-10 (×2): 2.5 mg via RESPIRATORY_TRACT
  Filled 2011-08-09 (×2): qty 0.5

## 2011-08-09 MED ORDER — FUROSEMIDE 40 MG PO TABS
40.0000 mg | ORAL_TABLET | Freq: Two times a day (BID) | ORAL | Status: DC
  Administered 2011-08-09 – 2011-08-11 (×4): 40 mg via ORAL
  Filled 2011-08-09 (×5): qty 1

## 2011-08-09 NOTE — Progress Notes (Signed)
Clinical Social Work Department BRIEF PSYCHOSOCIAL ASSESSMENT 08/09/2011  Patient:  [name],[name]     Account Number:  [id number]     Admit date:  08/07/2011  Clinical Social Worker:  Conley Simmonds  Date/Time:  08/09/2011 02:54 PM  Referred by:  Care Management  Date Referred:  08/08/2011 Referred for  Other - See comment   Other Referral:   From ALF   Interview type:  Patient Other interview type:    PSYCHOSOCIAL DATA Living Status:  FACILITY Admitted from facility:  [facility] Level of care:  Assisted Living Primary support name:  Avon Mergenthaler Primary support relationship to patient:  CHILD, ADULT Degree of support available:   [employer]    CURRENT CONCERNS Current Concerns  None Noted   Other Concerns:    SOCIAL WORK ASSESSMENT / PLAN CSW met briefly with pt at bedside-pt confirmed he is a resident of [facility] and plans to return at d/c.   Assessment/plan status:  Other - See comment Other assessment/ plan:   Pt will return to [facility] at d/c   Information/referral to community resources:   None at this time    PATIENT'S/FAMILY'S RESPONSE TO PLAN OF CARE: Pt very pleasant and appreciative of staff suport/concern   Jodean Lima, LCSW-Weekend [phone]

## 2011-08-09 NOTE — Progress Notes (Signed)
[name], [name]                ACCOUNT NO.:  [id number]  MEDICAL RECORD NO.:  [id number]  LOCATION:  A338                          FACILITY:  [facility]  PHYSICIAN:  Bud Kaeser D. [name], MD   DATE OF BIRTH:  1917/07/15  DATE OF PROCEDURE:  08/09/2011 DATE OF DISCHARGE:                                PROGRESS NOTE   SUBJECTIVE:  The patient feels better.  His appetite has improved.  His breathing is also much better.  No new complaints.  OBJECTIVE:  VITAL SIGNS:  The patient is alert, awake, and resting. VITAL SIGNS:  Blood pressure 146/62, pulse 74, respiratory rate 20, temperature 98.5 degrees Fahrenheit. CHEST:  Decreased air entry, few rhonchi. CARDIOVASCULAR:  First and second heart sounds heard.  No murmur.  No gallop. ABDOMEN:  Soft and lax.  Bowel sound is positive.  No mass or organomegaly. EXTREMITIES:  1+ edema.  LABORATORY DATA:  Sodium 138, potassium 3.3, chloride 97, carbon dioxide 36, glucose 118, BUN 8, creatinine 0.96, calcium 9.4.  ASSESSMENT: 1. Probably congestive heart failure. 2. Chronic atrial fibrillation. 3. Slightly elevated cardiac enzymes. 4. Dementia. 5. Deconditioning.  PLAN:  We will continue the patient on telemetry.  We will change his diuretics to oral.  We will monitor cardiac enzymes, BNP, and metabolic panel.  Cardiology consult is appreciated.  We will do physical therapy.     Ladan Vanderzanden D. [name], MD     TDF/MEDQ  D:  08/09/2011  T:  08/09/2011  Job:  [id number]

## 2011-08-10 LAB — BASIC METABOLIC PANEL
BUN: 10 mg/dL (ref 6–23)
Chloride: 97 mEq/L (ref 96–112)
Creatinine, Ser: 0.96 mg/dL (ref 0.50–1.35)
GFR calc Af Amer: 81 mL/min — ABNORMAL LOW (ref >90)
GFR calc non Af Amer: 70 mL/min — ABNORMAL LOW (ref >90)

## 2011-08-10 MED ORDER — ALPRAZOLAM 0.5 MG PO TABS
0.5000 mg | ORAL_TABLET | Freq: Every evening | ORAL | Status: DC | PRN
  Administered 2011-08-10: 0.5 mg via ORAL
  Filled 2011-08-10: qty 1

## 2011-08-10 MED ORDER — ACETAMINOPHEN 325 MG PO TABS
650.0000 mg | ORAL_TABLET | Freq: Four times a day (QID) | ORAL | Status: DC | PRN
  Administered 2011-08-10: 650 mg via ORAL
  Filled 2011-08-10: qty 2

## 2011-08-10 NOTE — Progress Notes (Signed)
NAMELEEVON, UPPERMAN                ACCOUNT NO.:  [id number]  MEDICAL RECORD NO.:  [id number]  LOCATION:  A338                          FACILITY:  [facility]  PHYSICIAN:  Alayia Meggison D. [name], MD   DATE OF BIRTH:  1917/11/08  DATE OF PROCEDURE:  08/10/2011 DATE OF DISCHARGE:                                PROGRESS NOTE   SUBJECTIVE:  The patient feels better.  He is ambulating with physical therapy.  No new complaints.  OBJECTIVE:  GENERAL:  The patient is more alert, awake and improving. VITALS:  Blood pressure 111/73, pulse 68, respiratory rate 20, temperature 97.6 degrees Fahrenheit. CHEST:  Decreased air entry, few rhonchi. CARDIOVASCULAR SYSTEM:  First and second heart sounds heard.  No murmur. No gallop.  ABDOMEN:  Soft and lax.  Bowel sound is positive.  No mass or organomegaly. EXTREMITIES:  No leg edema.  LABORATORY DATA:  Sodium 136, potassium 3.6, chloride 97, carbon dioxide 37, glucose 116, BUN 10, creatinine 0.97, calcium 9.7, BNP 2670. Cardiac enzyme, CPK total 59, CK-MB 63.6, troponin less than 0.30.  ASSESSMENT: 1. Change in mental status, clinically improved. 2. Congestive heart failure. 3. Elevated troponin, now resolved. 4. Dementia. 5. Deconditioning.  PLAN:  We will continue the patient on current medications.  Continue oral diuretics.  Continue physical therapy.  We will plan to see and evaluate the patient to [facility] once arrangement is made.     Venisa Frampton D. [name], MD     TDF/MEDQ  D:  08/10/2011  T:  08/10/2011  Job:  [id number]

## 2011-08-10 NOTE — Plan of Care (Signed)
Problem: Phase I Progression Outcomes Goal: Voiding-avoid urinary catheter unless indicated Outcome: Not Met (add Reason) Foley in place     

## 2011-08-11 LAB — BASIC METABOLIC PANEL
BUN: 11 mg/dL (ref 6–23)
Chloride: 97 mEq/L (ref 96–112)
Creatinine, Ser: 0.97 mg/dL (ref 0.50–1.35)
GFR calc Af Amer: 81 mL/min — ABNORMAL LOW (ref >90)

## 2011-08-11 NOTE — Progress Notes (Signed)
[name], [name]                ACCOUNT NO.:  [id number]  MEDICAL RECORD NO.:  [id number]  LOCATION:  A338                          FACILITY:  [facility]  PHYSICIAN:  Harlo Jaso A. Gerilyn Pilgrim, M.D. DATE OF BIRTH:  July 02, 1917  DATE OF PROCEDURE: DATE OF DISCHARGE:                                PROGRESS NOTE   The patient appears to be much improved, seems to be at his baseline. Today, he is awake and alert.  He knows he is in the hospital.  Does follow commands well.  Speech is normal.  Coronary reflexes are intact. Visual fields are fine.  Facial muscle strength is symmetric.  He has antigravity strength throughout.  After evaluation, vitamin B12 level is 1247, RPR nonreactive, TSH 1.8.  Urinalysis WBC 0-2, RBC 7-10, bacteria rare.  ASSESSMENT AND PLAN:  Baseline dementia with worsening cognition and altered mental status due to toxic metabolic causes.  He has now returned to baseline.  He is being discharged.  We will sign off.     Aziza Stuckert A. Gerilyn Pilgrim, M.D.     KAD/MEDQ  D:  08/11/2011  T:  08/11/2011  Job:  [id number]

## 2011-08-11 NOTE — Discharge Summary (Signed)
[name], [name]                ACCOUNT NO.:  [id number]  MEDICAL RECORD NO.:  [id number]  LOCATION:                                 FACILITY:  PHYSICIAN:  Rodriques Badie D. [name], MD   DATE OF BIRTH:  Jan 09, 1918  DATE OF ADMISSION:  08/07/2011 DATE OF DISCHARGE:  03/25/2013LH                              DISCHARGE SUMMARY   DISCHARGE DIAGNOSES: 1. Change in mental status secondary to metabolic causes. 2. Congestive heart failure secondary to diastolic dysfunction. 3. Elevated liver enzymes. 4. Chronic atrial fibrillation. 5. Dementia. 6. Hypothyroidism. 7. Benign prostatic hypertrophy. 8. Anxiety depression disorder.  DISCHARGE MEDICATIONS: 1. Advil p.m. 200/25, 2 tablets at bedtime p.r.n. for pain and sleep. 2. Xanax 0.75 mg p.o. at bedtime, take 1 and 1.5 tablets by mouth at     bedtime. 3. Aspirin 81 mg. 4. Calcium with vitamin D 1 tablet daily. 5. Levoxyl 0.125 mg p.o. daily. 6. Fish oil, omega-3 fatty acid 1000 mg, 1 g p.o. daily. 7. Lasix 40 mg daily. 8. Neurontin 600 mg 4 times a day. 9. Lecithin 1200 mg daily. 10.Levothyroxine 500 mcg daily. 11.Paxil 40 mg daily. 12.MiraLAX 17 g daily. 13.KCl 10 mcg daily. 14.Accupril 20 mg daily. 15.Senna 8.6 mg 3 tablets at bedtime. 16.Cardizem CD 120 mg daily. 17.Vitamin C 1000 mg daily. 18.Vitamin E 400 mg daily.  DISPOSITION:  The patient will be discharged to [facility].  DISCHARGE INSTRUCTION:  The patient will continue physical therapy at the rest home.  He will be followed in the office in 1 week duration.  LABORATORY DATA ON DISCHARGE:  Sodium 138, potassium 4.2, chloride 37, glucose 111, BUN 11, creatinine 0.7, calcium 10.0.  BNP 2670, CPK 59, CK- MB 3.6, troponin 0.30.  HOSPITAL COURSE:  This is a 78 year old male patient, who was a resident of [facility], brought to my office due to change in mental status and generalized weakness.  The patient was recently discharged from this hospital  after he was treated for acute gastroenteritis. During the evaluation, the patient was found to be very confused, disoriented and very weak.  He had a low blood pressure as well.  The patient was referred to emergency room, where he was further evaluated. He had a CT scan of the head which was negative.  However, he has an elevated troponin and elevated BNP.  The patient was then admitted under telemetry after consulting Cardiology.  The patient gradually started on IV diuretics.  Over the hospital stay, his symptoms resolved.  The patient became more alert and awake.  His cardiac enzymes and BNP also improved.  Currently, the patient is back to his baseline.  He is participating with physical therapy.  The patient will be discharged back to [facility], and will be followed in the office.  She will receive physical therapy while he is in the assisted living.     Mostyn Varnell D. [name], MD     TDF/MEDQ  D:  08/11/2011  T:  08/11/2011  Job:  [id number]

## 2011-08-11 NOTE — Progress Notes (Signed)
UR Chart Review Completed  

## 2011-08-11 NOTE — Progress Notes (Signed)
   CARE MANAGEMENT NOTE 08/11/2011  Patient:  Gramajo,Parish   Account Number:  [id number]  Date Initiated:  08/08/2011  Documentation initiated by:  Sharrie Rothman  Subjective/Objective Assessment:   Pt admitted with probable CHF and dementia. Pt resident of [facility]. Will return at discharge.     Action/Plan:   CM spoke with pt , but due to dementia, pt unable to make decision about HH needs. Will discuss with son. CSW aware of pt admission.   Anticipated DC Date:  08/15/2011   Anticipated DC Plan:  ASSISTED LIVING / REST HOME  In-house referral  Clinical Social Worker      DC Associate Professor  CM consult      [facility] Choice  HOME HEALTH   Choice offered to / List presented to:  C-2 HC POA / Guardian        HH arranged  HH-1 RN  HH-10 DISEASE MANAGEMENT  HH-2 PT      HH agency  [facility]   Status of service:  Completed, signed off Medicare Important Message given?  YES (If response is "NO", the following Medicare IM given date fields will be blank) Date Medicare IM given:  08/11/2011 Date Additional Medicare IM given:    Discharge Disposition:  ASSISTED LIVING  Per UR Regulation:    If discussed at Long Length of Stay Meetings, dates discussed:    Comments:  08/11/11 1059 Arlyss Queen, RN  BSN CM Pt discharged back to [facility] today. Pt son has requested PT at AL. Called Dr. [name] and received orders for RN and PT. Pt son requested [facility] (pt has dementia and son is HPOA). Alroy Bailiff is aware and will collect information from chart. [facility] staff has been made aware that pt will return with [facility]. [facility] has been added to pts AVS. Pt nurse made aware. CSW will arrange discharge with [facility]. 08/08/11 1500 Arlyss Queen, RN BSN CM Pt admitted from [facility]. WIll return to [facility] at discharge. Will monitor for discharge needs.

## 2011-08-11 NOTE — Progress Notes (Signed)
Pt to return to [facility] today via [facility]. Pt and ALF aware of d/c. No other CSW needs identified. CSW signing off.  Dellie Burns, MSW, [place] [phone]

## 2011-08-11 NOTE — Plan of Care (Signed)
Problem: Phase I Progression Outcomes Goal: OOB as tolerated unless otherwise ordered Outcome: Completed/Met Date Met:  08/11/11 With walker and assistance Goal: Initial discharge plan identified Outcome: Completed/Met Date Met:  08/11/11 Back to [facility]  Problem: Phase II Progression Outcomes Goal: Discharge plan established Outcome: Completed/Met Date Met:  08/11/11 Back to [facility]

## 2011-08-11 NOTE — Discharge Instructions (Addendum)
[facility] for registered nurse and physical therapy. 

## 2011-08-11 NOTE — Progress Notes (Signed)
Attempted to notify Dr. [name] in regards to patient taking Flomax prior to d/c per Sons request.  I did not get opportunity to speak with Dr. [name] prior to the pts ride being here.  Spoke with Lupita Leash the Supervisor at [facility] and she stated his medication would be in tonight and the patient would receive the med tonite.  Discussed this with the patiens son Davaid and he verbalized understanding and any other questions were answered.  Pt left the floor fully dressed with staff an packet in stable condition, ready to be transported by [facility].

## 2011-08-11 NOTE — Progress Notes (Signed)
Subjective: Interval History:   Objective: Vital signs in last 24 hours: Temp:  [97.7 F (36.5 C)-98.7 F (37.1 C)] 97.7 F (36.5 C) (03/25 0755) Pulse Rate:  [61-70] 61  (03/25 0755) Resp:  [20] 20  (03/25 0755) BP: (114-172)/(57-87) 172/87 mmHg (03/25 0755) SpO2:  [93 %-96 %] 96 % (03/25 0755) Weight:  [83.7 kg (184 lb 8.4 oz)] 83.7 kg (184 lb 8.4 oz) (03/25 0410)  Intake/Output from previous day: 03/24 0701 - 03/25 0700 In: 860 [P.O.:860] Out: 2260 [Urine:2260] Intake/Output this shift:   Nutritional status: Dysphagia    Lab Results:  Basename 08/11/11 0446 08/10/11 0434  WBC -- --  HGB -- --  HCT -- --  PLT -- --  NA 138 138  K 4.2 3.6  CL 97 97  CO2 37* 37*  GLUCOSE 111* 116*  BUN 11 10  CREATININE 0.97 0.96  CALCIUM 10.0 9.7  LABA1C -- --   Lipid Panel No results found for this basename: CHOL,TRIG,HDL,CHOLHDL,VLDL,LDLCALC in the last 72 hours  Studies/Results: No results found.  Medications:  Scheduled Meds:   . antiseptic oral rinse  15 mL Mouth Rinse q12n4p  . aspirin EC  81 mg Oral Daily  . calcium-vitamin D  1 tablet Oral Daily  . chlorhexidine  15 mL Mouth Rinse BID  . digoxin  125 mcg Oral Daily  . diltiazem  120 mg Oral Daily  . enoxaparin (LOVENOX) injection  40 mg Subcutaneous Q24H  . furosemide  40 mg Oral BID  . gabapentin  600 mg Oral QID  . levothyroxine  50 mcg Oral Daily  . omega-3 acid ethyl esters  1 g Oral Daily  . PARoxetine  40 mg Oral QHS  . polyethylene glycol  17 g Oral Daily  . potassium chloride  40 mEq Oral Daily  . senna  3 tablet Oral QHS  . vitamin C  1,000 mg Oral Daily  . vitamin E  400 Units Oral Daily   Continuous Infusions:  PRN Meds:.acetaminophen, albuterol, ALPRAZolam   Assessment/Plan: See dictation   LOS: 4 days   Tristin Gladman

## 2011-08-29 NOTE — Discharge Summary (Signed)
[name], [name]                ACCOUNT NO.:  [id number]  MEDICAL RECORD NO.:  [id number]  LOCATION:  A338                          FACILITY:  [facility]  PHYSICIAN:  Pranish Akhavan D. [name], MD   DATE OF BIRTH:  22-Mar-1918  DATE OF ADMISSION:  08/07/2011 DATE OF DISCHARGE:  03/25/2013LH                              DISCHARGE SUMMARY   ADDENDUM: 1. Digoxin 0.125 mg p.o. daily.     Ashna Dorough D. [name], MD     TDF/MEDQ  D:  08/29/2011  T:  08/29/2011  Job:  [id number]

## 2011-09-19 ENCOUNTER — Other Ambulatory Visit (HOSPITAL_COMMUNITY): Payer: Self-pay | Admitting: Internal Medicine

## 2011-09-19 ENCOUNTER — Ambulatory Visit (HOSPITAL_COMMUNITY)
Admission: RE | Admit: 2011-09-19 | Discharge: 2011-09-19 | Disposition: A | Discharge status: Home / Self Care | Payer: Medicare Other | Source: Ambulatory Visit | Attending: Internal Medicine | Admitting: Internal Medicine

## 2011-09-19 DIAGNOSIS — R05 Cough: Secondary | ICD-10-CM | POA: Insufficient documentation

## 2011-09-19 DIAGNOSIS — J40 Bronchitis, not specified as acute or chronic: Secondary | ICD-10-CM

## 2011-09-19 DIAGNOSIS — R059 Cough, unspecified: Secondary | ICD-10-CM | POA: Insufficient documentation

## 2011-09-19 DIAGNOSIS — I517 Cardiomegaly: Secondary | ICD-10-CM | POA: Insufficient documentation

## 2011-11-20 ENCOUNTER — Other Ambulatory Visit: Payer: Self-pay

## 2011-11-20 ENCOUNTER — Emergency Department (HOSPITAL_COMMUNITY): Payer: Medicare Other

## 2011-11-20 ENCOUNTER — Encounter (HOSPITAL_COMMUNITY): Payer: Self-pay | Admitting: *Deleted

## 2011-11-20 ENCOUNTER — Inpatient Hospital Stay (HOSPITAL_COMMUNITY)
Admission: EM | Admit: 2011-11-20 | Discharge: 2011-11-25 | DRG: 193 | Disposition: A | Discharge status: Nursing Home (Includes ICF) | Payer: Medicare Other | Attending: Internal Medicine | Admitting: Internal Medicine

## 2011-11-20 DIAGNOSIS — E039 Hypothyroidism, unspecified: Secondary | ICD-10-CM | POA: Diagnosis present

## 2011-11-20 DIAGNOSIS — G9341 Metabolic encephalopathy: Secondary | ICD-10-CM | POA: Diagnosis present

## 2011-11-20 DIAGNOSIS — Z880 Allergy status to penicillin: Secondary | ICD-10-CM

## 2011-11-20 DIAGNOSIS — Z7982 Long term (current) use of aspirin: Secondary | ICD-10-CM

## 2011-11-20 DIAGNOSIS — G589 Mononeuropathy, unspecified: Secondary | ICD-10-CM | POA: Diagnosis present

## 2011-11-20 DIAGNOSIS — I10 Essential (primary) hypertension: Secondary | ICD-10-CM | POA: Diagnosis present

## 2011-11-20 DIAGNOSIS — Z886 Allergy status to analgesic agent status: Secondary | ICD-10-CM

## 2011-11-20 DIAGNOSIS — N400 Enlarged prostate without lower urinary tract symptoms: Secondary | ICD-10-CM | POA: Diagnosis present

## 2011-11-20 DIAGNOSIS — N289 Disorder of kidney and ureter, unspecified: Secondary | ICD-10-CM | POA: Diagnosis present

## 2011-11-20 DIAGNOSIS — R5381 Other malaise: Secondary | ICD-10-CM | POA: Diagnosis present

## 2011-11-20 DIAGNOSIS — R509 Fever, unspecified: Secondary | ICD-10-CM

## 2011-11-20 DIAGNOSIS — I509 Heart failure, unspecified: Secondary | ICD-10-CM | POA: Diagnosis present

## 2011-11-20 DIAGNOSIS — Z882 Allergy status to sulfonamides status: Secondary | ICD-10-CM

## 2011-11-20 DIAGNOSIS — J189 Pneumonia, unspecified organism: Principal | ICD-10-CM | POA: Diagnosis present

## 2011-11-20 DIAGNOSIS — F0390 Unspecified dementia without behavioral disturbance: Secondary | ICD-10-CM | POA: Diagnosis present

## 2011-11-20 DIAGNOSIS — J449 Chronic obstructive pulmonary disease, unspecified: Secondary | ICD-10-CM | POA: Diagnosis present

## 2011-11-20 DIAGNOSIS — R4182 Altered mental status, unspecified: Secondary | ICD-10-CM

## 2011-11-20 DIAGNOSIS — J4489 Other specified chronic obstructive pulmonary disease: Secondary | ICD-10-CM | POA: Diagnosis present

## 2011-11-20 DIAGNOSIS — M1990 Unspecified osteoarthritis, unspecified site: Secondary | ICD-10-CM | POA: Diagnosis present

## 2011-11-20 DIAGNOSIS — Z79899 Other long term (current) drug therapy: Secondary | ICD-10-CM

## 2011-11-20 HISTORY — DX: Benign prostatic hyperplasia without lower urinary tract symptoms: N40.0

## 2011-11-20 HISTORY — DX: Diaphragmatic hernia without obstruction or gangrene: K44.9

## 2011-11-20 HISTORY — DX: Polyneuropathy, unspecified: G62.9

## 2011-11-20 LAB — COMPREHENSIVE METABOLIC PANEL
AST: 19 U/L (ref 0–37)
Albumin: 4.2 g/dL (ref 3.5–5.2)
Calcium: 10.7 mg/dL — ABNORMAL HIGH (ref 8.4–10.5)
Creatinine, Ser: 1.34 mg/dL (ref 0.50–1.35)
Total Protein: 7.2 g/dL (ref 6.0–8.3)

## 2011-11-20 LAB — CBC WITH DIFFERENTIAL/PLATELET
Basophils Absolute: 0 10*3/uL (ref 0.0–0.1)
Basophils Relative: 0 % (ref 0–1)
Eosinophils Absolute: 0.1 10*3/uL (ref 0.0–0.7)
Eosinophils Relative: 1 % (ref 0–5)
HCT: 36.9 % — ABNORMAL LOW (ref 39.0–52.0)
MCH: 28.8 pg (ref 26.0–34.0)
MCHC: 32.5 g/dL (ref 30.0–36.0)
MCV: 88.7 fL (ref 78.0–100.0)
Monocytes Absolute: 1 10*3/uL (ref 0.1–1.0)
Neutro Abs: 8.4 10*3/uL — ABNORMAL HIGH (ref 1.7–7.7)
RDW: 15.2 % (ref 11.5–15.5)

## 2011-11-20 LAB — TROPONIN I: Troponin I: <0.3 ng/mL (ref <0.30)

## 2011-11-20 LAB — URINALYSIS, ROUTINE W REFLEX MICROSCOPIC
Glucose, UA: NEGATIVE mg/dL
Leukocytes, UA: NEGATIVE
Nitrite: NEGATIVE
pH: 6.5 (ref 5.0–8.0)

## 2011-11-20 MED ORDER — PAROXETINE HCL 20 MG PO TABS
40.0000 mg | ORAL_TABLET | Freq: Every day | ORAL | Status: DC
  Administered 2011-11-20 – 2011-11-24 (×5): 40 mg via ORAL
  Filled 2011-11-20 (×5): qty 2

## 2011-11-20 MED ORDER — LECITHIN 1200 MG PO CAPS: 1200.0000 mg | ORAL_CAPSULE | Freq: Every day | ORAL | Status: DC

## 2011-11-20 MED ORDER — ASPIRIN EC 81 MG PO TBEC
81.0000 mg | DELAYED_RELEASE_TABLET | Freq: Every day | ORAL | Status: DC
  Administered 2011-11-21 – 2011-11-25 (×5): 81 mg via ORAL
  Filled 2011-11-20 (×5): qty 1

## 2011-11-20 MED ORDER — TAMSULOSIN HCL 0.4 MG PO CAPS
0.4000 mg | ORAL_CAPSULE | Freq: Every day | ORAL | Status: DC
  Administered 2011-11-20 – 2011-11-25 (×6): 0.4 mg via ORAL
  Filled 2011-11-20 (×6): qty 1

## 2011-11-20 MED ORDER — SODIUM CHLORIDE 0.9 % IV SOLN
INTRAVENOUS | Status: DC
  Administered 2011-11-20 – 2011-11-21 (×2): via INTRAVENOUS

## 2011-11-20 MED ORDER — VITAMIN C 500 MG PO TABS
1000.0000 mg | ORAL_TABLET | Freq: Every day | ORAL | Status: DC
  Administered 2011-11-21 – 2011-11-25 (×5): 1000 mg via ORAL
  Filled 2011-11-20 (×5): qty 2

## 2011-11-20 MED ORDER — LEVOFLOXACIN IN D5W 500 MG/100ML IV SOLN
INTRAVENOUS | Status: AC
  Filled 2011-11-20: qty 100

## 2011-11-20 MED ORDER — DIGOXIN 125 MCG PO TABS
125.0000 ug | ORAL_TABLET | Freq: Every day | ORAL | Status: DC
  Administered 2011-11-21 – 2011-11-25 (×5): 125 ug via ORAL
  Filled 2011-11-20 (×6): qty 1

## 2011-11-20 MED ORDER — DILTIAZEM HCL ER COATED BEADS 120 MG PO CP24
120.0000 mg | ORAL_CAPSULE | Freq: Every day | ORAL | Status: DC
  Administered 2011-11-22 – 2011-11-25 (×4): 120 mg via ORAL
  Filled 2011-11-20 (×5): qty 1

## 2011-11-20 MED ORDER — GABAPENTIN 300 MG PO CAPS
600.0000 mg | ORAL_CAPSULE | Freq: Four times a day (QID) | ORAL | Status: DC
  Administered 2011-11-20 – 2011-11-25 (×18): 600 mg via ORAL
  Filled 2011-11-20 (×18): qty 2

## 2011-11-20 MED ORDER — VITAMIN E 180 MG (400 UNIT) PO CAPS
400.0000 [IU] | ORAL_CAPSULE | Freq: Every day | ORAL | Status: DC
  Administered 2011-11-21 – 2011-11-25 (×5): 400 [IU] via ORAL
  Filled 2011-11-20 (×6): qty 1

## 2011-11-20 MED ORDER — ALBUTEROL SULFATE HFA 108 (90 BASE) MCG/ACT IN AERS: 2.0000 | INHALATION_SPRAY | Freq: Four times a day (QID) | RESPIRATORY_TRACT | Status: DC | PRN

## 2011-11-20 MED ORDER — POTASSIUM CHLORIDE CRYS ER 10 MEQ PO TBCR
10.0000 meq | EXTENDED_RELEASE_TABLET | Freq: Every day | ORAL | Status: DC
  Administered 2011-11-21 – 2011-11-25 (×5): 10 meq via ORAL
  Filled 2011-11-20 (×6): qty 1

## 2011-11-20 MED ORDER — OMEGA-3-ACID ETHYL ESTERS 1 G PO CAPS
1.0000 g | ORAL_CAPSULE | Freq: Every day | ORAL | Status: DC
  Administered 2011-11-21 – 2011-11-25 (×5): 1 g via ORAL
  Filled 2011-11-20 (×5): qty 1

## 2011-11-20 MED ORDER — LEVOFLOXACIN IN D5W 500 MG/100ML IV SOLN
500.0000 mg | INTRAVENOUS | Status: DC
  Administered 2011-11-20 – 2011-11-21 (×2): 500 mg via INTRAVENOUS
  Filled 2011-11-20 (×4): qty 100

## 2011-11-20 MED ORDER — ALBUTEROL SULFATE (5 MG/ML) 0.5% IN NEBU
2.5000 mg | INHALATION_SOLUTION | Freq: Four times a day (QID) | RESPIRATORY_TRACT | Status: DC
  Administered 2011-11-21 – 2011-11-25 (×16): 2.5 mg via RESPIRATORY_TRACT
  Filled 2011-11-20 (×16): qty 0.5

## 2011-11-20 MED ORDER — ACETAMINOPHEN 325 MG PO TABS
650.0000 mg | ORAL_TABLET | Freq: Once | ORAL | Status: AC
  Administered 2011-11-20: 650 mg via ORAL
  Filled 2011-11-20: qty 2

## 2011-11-20 MED ORDER — QUINAPRIL HCL 10 MG PO TABS: 20.0000 mg | ORAL_TABLET | Freq: Every day | ORAL | Status: DC

## 2011-11-20 MED ORDER — FUROSEMIDE 40 MG PO TABS
40.0000 mg | ORAL_TABLET | Freq: Every day | ORAL | Status: DC
  Administered 2011-11-21 – 2011-11-25 (×5): 40 mg via ORAL
  Filled 2011-11-20 (×5): qty 1

## 2011-11-20 MED ORDER — ENOXAPARIN SODIUM 40 MG/0.4ML ~~LOC~~ SOLN
40.0000 mg | SUBCUTANEOUS | Status: DC
  Administered 2011-11-20 – 2011-11-24 (×5): 40 mg via SUBCUTANEOUS
  Filled 2011-11-20 (×5): qty 0.4

## 2011-11-20 MED ORDER — LEVOTHYROXINE SODIUM 50 MCG PO TABS
50.0000 ug | ORAL_TABLET | Freq: Every day | ORAL | Status: DC
  Administered 2011-11-21 – 2011-11-25 (×5): 50 ug via ORAL
  Filled 2011-11-20 (×5): qty 1

## 2011-11-20 MED ORDER — ALPRAZOLAM 0.25 MG PO TABS
0.3750 mg | ORAL_TABLET | Freq: Every day | ORAL | Status: DC
  Administered 2011-11-20 – 2011-11-24 (×5): 0.375 mg via ORAL
  Filled 2011-11-20 (×5): qty 2

## 2011-11-20 MED ORDER — SENNA 8.6 MG PO TABS
3.0000 | ORAL_TABLET | Freq: Every day | ORAL | Status: DC
  Administered 2011-11-20 – 2011-11-24 (×5): 25.8 mg via ORAL
  Filled 2011-11-20: qty 1
  Filled 2011-11-20: qty 3
  Filled 2011-11-20: qty 1
  Filled 2011-11-20: qty 2
  Filled 2011-11-20: qty 1
  Filled 2011-11-20: qty 3
  Filled 2011-11-20: qty 2

## 2011-11-20 MED ORDER — LORATADINE 10 MG PO TABS
10.0000 mg | ORAL_TABLET | Freq: Every day | ORAL | Status: DC
  Administered 2011-11-21 – 2011-11-25 (×5): 10 mg via ORAL
  Filled 2011-11-20 (×6): qty 1

## 2011-11-20 MED ORDER — ALBUTEROL SULFATE (5 MG/ML) 0.5% IN NEBU
INHALATION_SOLUTION | RESPIRATORY_TRACT | Status: AC
  Administered 2011-11-20: 23:00:00
  Filled 2011-11-20: qty 0.5

## 2011-11-20 MED ORDER — POLYETHYLENE GLYCOL 3350 17 GM/SCOOP PO POWD
17.0000 g | Freq: Every day | ORAL | Status: DC
  Filled 2011-11-20: qty 255

## 2011-11-20 MED ORDER — LISINOPRIL 10 MG PO TABS
20.0000 mg | ORAL_TABLET | Freq: Every day | ORAL | Status: DC
  Administered 2011-11-22 – 2011-11-25 (×4): 20 mg via ORAL
  Filled 2011-11-20 (×5): qty 2

## 2011-11-20 NOTE — ED Notes (Signed)
Pt resting in bed, easily arousable, no new complaints.

## 2011-11-20 NOTE — ED Notes (Signed)
Pt is a resident at [facility], per pt's son the pt has been weak and hot today. Pt has equal grips both sides with no drift, pupils PERRLA. Pt's temp was 102 tympanic. Pt is usually able to carry on a normal conversation, today he is more disoriented, speech garbled at times.

## 2011-11-20 NOTE — H&P (Signed)
[name], [name]                ACCOUNT NO.:  [id number]  MEDICAL RECORD NO.:  [id number]  LOCATION:  A329                          FACILITY:  [facility]  PHYSICIAN:  Edenilson Austad D. [name], MD   DATE OF BIRTH:  Jan 12, 1918  DATE OF ADMISSION:  11/20/2011 DATE OF DISCHARGE:  LH                             HISTORY & PHYSICAL   CHIEF COMPLAINT:  Fever and generalized weakness.  HISTORY OF PRESENT ILLNESS:  This is a 78 year old male patient with history of multiple medical illnesses who is currently a resident of [facility], brought to emergency room due to fever, generalized weakness, and change in his mental status.  The patient recently had symptoms of cough and congestion.  He was treated with symptomatic treatment including antihistamine and nebulizer treatment. However, in the last 2 days the patient became more weak and confused. He had also a fever of 102 degrees Fahrenheit.  His chest x-ray and other workup was negative.  The source of his fever was not very clear. However, the patient was admitted for further treatment.  REVIEW OF SYSTEMS:  The patient is somewhat confused and disoriented. He is not unable to give any detailed history.  PAST MEDICAL HISTORY: 1. Chronic obstructive pulmonary disease. 2. Dementia. 3. Hypertension. 4. Hypothyroidism. 5. Congestive heart failure. 6. Benign prostatic hypertrophy. 7. Degenerative joint disease. 8. Neuropathy.  CURRENT MEDICATIONS: 1. Xanax 0.25 mg at bedtime. 2. Aspirin 81 mg daily. 3. Calcium with vitamin D one tablet daily. 4. Digoxin 0.15 mg daily. 5. Cardizem 120 mg daily. 6. Fish oil daily. 7. Lasix 40 mg daily. 8. Neurontin 600 mg 4 times daily. 9. Lecithin 1200 mg daily. 10.Synthroid 50 mcg daily. 11.Loratadine 10 mg daily. 12.Paxil 40 mg daily. 13.MiraLax 17 g daily. 14.Potassium chloride 10 mEq daily. 15.Accupril 20 mg daily. 16.Flomax 0.4 mg daily. 17.Ascorbic acid 500 mg daily. 18.Senna 1  tablet at bedtime. 19.Vitamin E 400 mg daily.  SOCIAL HISTORY:  The patient is currently a resident of [facility].  No history of alcohol, tobacco, or substance abuse.  PHYSICAL EXAMINATION:  GENERAL:  The patient is awake and alert, but confused and disoriented.  VITAL SIGNS: Blood pressure 112/55, pulse 63 respiratory rate 23, temperature 102 degrees Fahrenheit.  HEENT:  Pupils are equal and reactive.  NECK:  Supple.  CHEST:  Decreased air entry, few rhonchi.  CARDIOVASCULAR:  First and second heart sound heard.  No murmur.  No gallop.  ABDOMEN:  Soft and lax.  Bowel sound is positive.  No mass or organomegaly.  EXTREMITIES:  No leg edema.  LABORATORY DATA:  Sodium 137, potassium 4.5, chloride 97, glucose 98, carbon dioxide 31, BUN 25, creatinine 1.34, calcium 10.7.  CBC:  WBC 10.4, hemoglobin 12.0, hematocrit 36.9, and platelet 116.  Urinalysis, specific gravity 1.010, pH 6.5, nitrite is negative, glucose trace negative.  ASSESSMENT: 1. Fever, etiology not clear.  Probably, the patient could have early     pneumonia versus upper respiratory tract viral infection. 2. Metabolic encephalopathy. 3. Dementia. 4. Hypothyroidism. 5. History of congestive heart failure. 6. Degenerative joint disease.  PLAN:  We will gradually hydrate the patient.  We will start  the patient empirically on IV antibiotics pending culture results.  We will continue his regular medications.  We will monitor his CBC, BNP.     Kinda Pottle D. [name], MD     TDF/MEDQ  D:  11/20/2011  T:  11/20/2011  Job:  [id number]

## 2011-11-20 NOTE — ED Provider Notes (Signed)
History     CSN: [id number]  Arrival date & time 11/20/11  1648   First MD Initiated Contact with Patient 11/20/11 1653      Chief Complaint  Patient presents with  . Weakness    (Consider location/radiation/quality/duration/timing/severity/associated sxs/prior treatment) Patient is a 78 y.o. male presenting with weakness. The history is provided by the nursing home and the patient. History Limited By: Altered mental status.  Weakness  Additional symptoms include weakness.  He was transferred from her nursing home today oh where he was noted to have a fever to 102 and to be disoriented and weak. Patient states that he did have some chest pain starting last night as well as some nausea, dyspnea, and sweating. He also complains of generalized weakness. However, he is not able to consistently carry on any conversation and amount of certain what he says is accurate.  Past Medical History  Diagnosis Date  . COPD (chronic obstructive pulmonary disease)   . Shortness of breath   . Asthma   . DEMENTIA     short term  . Hypertension   . Arthritis   . Hypothyroidism   . CHF (congestive heart failure)   . BPH (benign prostatic hyperplasia)   . Hiatal hernia   . Neuropathy     Past Surgical History  Procedure Date  . Repair knee ligament     History reviewed. No pertinent family history.  History  Substance Use Topics  . Smoking status: Never Smoker   . Smokeless tobacco: Not on file  . Alcohol Use: No      Review of Systems  Unable to perform ROS: Mental status change  Neurological: Positive for weakness.    Allergies  Codeine; Penicillins; and Sulfa antibiotics  Home Medications   Current Outpatient Rx  Name Route Sig Dispense Refill  . ALPRAZOLAM 0.25 MG PO TABS Oral Take 0.375 mg by mouth at bedtime. 1 & one-half tablet taken at bedtime    . ASPIRIN EC 81 MG PO TBEC Oral Take 81 mg by mouth daily.     Marland Kitchen CALCIUM 600/VITAMIN D PO Oral Take 1 tablet by mouth  daily.    Marland Kitchen DIGOXIN 0.125 MG PO TABS Oral Take 125 mcg by mouth daily.     Marland Kitchen DILTIAZEM HCL ER BEADS 120 MG PO CP24 Oral Take 120 mg by mouth daily.     . OMEGA-3 FATTY ACIDS 1000 MG PO CAPS Oral Take 1 g by mouth daily.     . FUROSEMIDE 40 MG PO TABS Oral Take 40 mg by mouth daily.     Marland Kitchen GABAPENTIN 600 MG PO TABS Oral Take 600 mg by mouth 4 (four) times daily.     Drema Balzarine HCL 200-25 MG PO CAPS Oral Take 2 tablets by mouth at bedtime. For pain and sleep    . LECITHIN 1200 MG PO CAPS Oral Take 1,200 mg by mouth daily.    Marland Kitchen LEVOTHYROXINE SODIUM 50 MCG PO TABS Oral Take 50 mcg by mouth daily.     Marland Kitchen LORATADINE 10 MG PO TABS Oral Take 10 mg by mouth daily.    Marland Kitchen PAROXETINE HCL 40 MG PO TABS Oral Take 40 mg by mouth at bedtime.     Marland Kitchen POLYETHYLENE GLYCOL 3350 PO POWD Oral Take 17 g by mouth daily. **MIX ONE CAPFUL IN 8 OUNCES OF WATER/JUICE THEN DRINK DAILY**    . POTASSIUM CHLORIDE ER 10 MEQ PO TBCR Oral Take 10 mEq by mouth daily.     Marland Kitchen  QUINAPRIL HCL 20 MG PO TABS Oral Take 20 mg by mouth daily.     . SENNA 8.6 MG PO TABS Oral Take 3 tablets by mouth at bedtime.     . TAMSULOSIN HCL 0.4 MG PO CAPS Oral Take 0.4 mg by mouth.    Marland Kitchen VITAMIN C 500 MG PO TABS Oral Take 1,000 mg by mouth daily.     Marland Kitchen VITAMIN E 400 UNITS PO CAPS Oral Take 400 Units by mouth daily.       BP 147/64  Pulse 66  Temp 102 F (38.9 C) (Tympanic)  Resp 17  Ht 5\' 10"  (1.778 m)  Wt 170 lb (77.111 kg)  BMI 24.39 kg/m2  SpO2 90%  Physical Exam  Nursing note and vitals reviewed.  78 year old male who is resting comfortably and in no acute distress. Vital signs are significant for fever with temperature 100.2, and mild hypertension with blood pressure 147/64. Oxygen saturation is 90% which is hypoxic. Head is normocephalic and atraumatic. PERRLA, EOMI. Neck is nontender and supple without adenopathy or JVD. Lungs are clear without rales, wheezes, or rhonchi. Heart has an irregular rhythm without murmur.  Abdomen is soft, flat, nontender without masses or hepatosplenomegaly. Extremities have full range of motion, no cyanosis or edema. Skin is warm and dry without rash, although venous stasis changes are noted on his legs. Neurologic: He is awake and alert and oriented to person. He is not oriented to place or time. Cranial nerves are intact. There are no focal motor or sensory deficits.  ED Course  Procedures (including critical care time)  Results for orders placed during the hospital encounter of 11/20/11  CBC WITH DIFFERENTIAL      Component Value Range   WBC 10.4  4.0 - 10.5 K/uL   RBC 4.16 (*) 4.22 - 5.81 MIL/uL   Hemoglobin 12.0 (*) 13.0 - 17.0 g/dL   HCT 11.9 (*) 14.7 - 82.9 %   MCV 88.7  78.0 - 100.0 fL   MCH 28.8  26.0 - 34.0 pg   MCHC 32.5  30.0 - 36.0 g/dL   RDW 56.2  13.0 - 86.5 %   Platelets 116 (*) 150 - 400 K/uL   Neutrophils Relative 80 (*) 43 - 77 %   Neutro Abs 8.4 (*) 1.7 - 7.7 K/uL   Lymphocytes Relative 9 (*) 12 - 46 %   Lymphs Abs 0.9  0.7 - 4.0 K/uL   Monocytes Relative 10  3 - 12 %   Monocytes Absolute 1.0  0.1 - 1.0 K/uL   Eosinophils Relative 1  0 - 5 %   Eosinophils Absolute 0.1  0.0 - 0.7 K/uL   Basophils Relative 0  0 - 1 %   Basophils Absolute 0.0  0.0 - 0.1 K/uL  COMPREHENSIVE METABOLIC PANEL      Component Value Range   Sodium 137  135 - 145 mEq/L   Potassium 4.5  3.5 - 5.1 mEq/L   Chloride 97  96 - 112 mEq/L   CO2 31  19 - 32 mEq/L   Glucose, Bld 98  70 - 99 mg/dL   BUN 25 (*) 6 - 23 mg/dL   Creatinine, Ser 7.84  0.50 - 1.35 mg/dL   Calcium 69.6 (*) 8.4 - 10.5 mg/dL   Total Protein 7.2  6.0 - 8.3 g/dL   Albumin 4.2  3.5 - 5.2 g/dL   AST 19  0 - 37 U/L   ALT 13  0 - 53 U/L  Alkaline Phosphatase 73  39 - 117 U/L   Total Bilirubin 1.1  0.3 - 1.2 mg/dL   GFR calc non Af Amer 45 (*) >90 mL/min   GFR calc Af Amer 52 (*) >90 mL/min  TROPONIN I      Component Value Range   Troponin I <0.30  <0.30 ng/mL  URINALYSIS, ROUTINE W REFLEX MICROSCOPIC        Component Value Range   Color, Urine YELLOW  YELLOW   APPearance CLEAR  CLEAR   Specific Gravity, Urine 1.010  1.005 - 1.030   pH 6.5  5.0 - 8.0   Glucose, UA NEGATIVE  NEGATIVE mg/dL   Hgb urine dipstick NEGATIVE  NEGATIVE   Bilirubin Urine NEGATIVE  NEGATIVE   Ketones, ur NEGATIVE  NEGATIVE mg/dL   Protein, ur NEGATIVE  NEGATIVE mg/dL   Urobilinogen, UA 0.2  0.0 - 1.0 mg/dL   Nitrite NEGATIVE  NEGATIVE   Leukocytes, UA NEGATIVE  NEGATIVE  CULTURE, BLOOD (ROUTINE X 2)      Component Value Range   Specimen Description LEFT ANTECUBITAL     Special Requests BOTTLES DRAWN AEROBIC AND ANAEROBIC 4CC     Culture PENDING     Report Status PENDING    CULTURE, BLOOD (ROUTINE X 2)      Component Value Range   Specimen Description BLOOD RIGHT ARM     Special Requests BOTTLES DRAWN AEROBIC ONLY 4CC     Culture PENDING     Report Status PENDING     Ct Head Wo Contrast  11/20/2011  *RADIOLOGY REPORT*  Clinical Data: Weakness and dementia.  Shortness of breath.  CT HEAD WITHOUT CONTRAST  Technique:  Contiguous axial images were obtained from the base of the skull through the vertex without contrast.  Comparison: CT head without contrast 08/07/2011.  Findings: A remote lacunar infarct of the right basal ganglia is stable.  Atrophy and moderate white matter disease is not significantly changed.  No acute cortical infarct, hemorrhage, mass lesion is present.  The ventricles are proportionate to the degree of atrophy.  No significant extra-axial fluid collection is present  The paranasal sinuses and mastoid air cells are clear.  The osseous skull is intact.  Atherosclerotic calcifications are again seen within the cavernous carotid arteries.  IMPRESSION:  1.  Stable atrophy white matter disease. 2.  Stable remote lacunar infarct of the right basal ganglia. 3.  Atherosclerosis 4.  No acute intracranial abnormality or significant interval change.  Original Report Authenticated By: Jamesetta Orleans. MATTERN,  M.D.   Dg Chest Portable 1 View  11/20/2011  *RADIOLOGY REPORT*  Clinical Data: Shortness of breath and weakness.  PORTABLE CHEST - 1 VIEW  Comparison: Portable chest 09/19/2011.  Findings: Cardiac enlargement is stable.  The lung volumes are slightly decreased.  Mild pulmonary vascular congestion is now present.  Mild bibasilar atelectasis is also evident.  No other focal airspace consolidation is present.  IMPRESSION:  1.  Stable cardiomegaly with new onset of mild pulmonary vascular congestion, suggesting early congestive heart failure. 2.  Mild bibasilar atelectasis.  Original Report Authenticated By: Jamesetta Orleans. MATTERN, M.D.      Date: 11/20/2011  Rate: 66  Rhythm: atrial fibrillation and premature ventricular contractions (PVC)  QRS Axis: left  Intervals: normal  ST/T Wave abnormalities: nonspecific ST/T changes  Conduction Disutrbances:Incomplete right bundle-branch block  Narrative Interpretation: Atrial fibrillation with PVCs, low voltage, left axis deviation, incomplete right bundle-branch block. When compared with ECG of 08/07/2011, no significant  changes are seen. Old EKG Reviewed: unchanged 66  1. Fever   2. Altered mental status   3. Renal insufficiency       MDM  Fever with weakness. Most likely sources are urinary tract infection and pneumonia. Chest x-ray will be obtained as well as urinalysis. Laboratory workup is initiated as well. Old records are reviewed and he was hospitalized recently with altered mental status due to metabolic causes and he was also noted to have elevated cardiac enzymes at that time. Cardiac markers will be checked today.   Workup is unremarkable. X-ray shows no evidence of pneumonia, urinalysis has no evidence of urinary tract infection. BUN is noted to have more than doubled from the last value with slight elevation in creatinine. He will begin IV fluids since it appears he may be somewhat dehydrated. Case is discussed with his primary care  physician, Dr. Charlean Merl, who agrees to admit the patient.     Dione Booze, MD 11/20/11 [phone]

## 2011-11-20 NOTE — ED Notes (Signed)
Son noted pt having increase in legs twitching that is not normal per son. Son Brett Canales [phone], brother Hubbard Robinson [phone] alternate number [phone]

## 2011-11-21 LAB — BASIC METABOLIC PANEL
BUN: 28 mg/dL — ABNORMAL HIGH (ref 6–23)
CO2: 29 mEq/L (ref 19–32)
Chloride: 96 mEq/L (ref 96–112)
GFR calc non Af Amer: 44 mL/min — ABNORMAL LOW (ref >90)
Glucose, Bld: 99 mg/dL (ref 70–99)
Potassium: 4.3 mEq/L (ref 3.5–5.1)
Sodium: 135 mEq/L (ref 135–145)

## 2011-11-21 LAB — CBC
HCT: 34 % — ABNORMAL LOW (ref 39.0–52.0)
Hemoglobin: 11.1 g/dL — ABNORMAL LOW (ref 13.0–17.0)
MCHC: 32.6 g/dL (ref 30.0–36.0)
RBC: 3.82 MIL/uL — ABNORMAL LOW (ref 4.22–5.81)
WBC: 7.9 10*3/uL (ref 4.0–10.5)

## 2011-11-21 LAB — MRSA PCR SCREENING: MRSA by PCR: NEGATIVE

## 2011-11-21 MED ORDER — POLYETHYLENE GLYCOL 3350 17 G PO PACK
17.0000 g | PACK | Freq: Every day | ORAL | Status: DC
  Administered 2011-11-21 – 2011-11-25 (×5): 17 g via ORAL
  Filled 2011-11-21 (×5): qty 1

## 2011-11-21 NOTE — Plan of Care (Signed)
Problem: Phase I Progression Outcomes Goal: OOB as tolerated unless otherwise ordered Outcome: Progressing weakness

## 2011-11-21 NOTE — Progress Notes (Signed)
Pt had not urinated since he had been on the floor. Bladder scanner was reading between 0-40cc but pt was complaining of some discomfort. Bladder scanner not working correctly.Marland Kitchen Spoke with Dr. [name] and he said to do an I&O cath. Pt had 350cc output from I&O cath.

## 2011-11-21 NOTE — Progress Notes (Signed)
PT has low blood pressure and episode of sinus brady while having BM. BP meds held. Dr. [name] made aware.

## 2011-11-21 NOTE — Progress Notes (Signed)
[name] [name]                ACCOUNT NO.:  [id number]  MEDICAL RECORD NO.:  [id number]  LOCATION:  A329                          FACILITY:  [facility]  PHYSICIAN:  Benedicto Capozzi D. [name], MD   DATE OF BIRTH:  [birth year]  DATE OF PROCEDURE:  11/21/2011 DATE OF DISCHARGE:                                PROGRESS NOTE   SUBJECTIVE:  The patient feels better.  His fever is subsiding.  OBJECTIVE:  GENERAL:  The patient is alert, awake, however, remained confused and disoriented. VITAL SIGNS:  Blood pressure 147/64, pulse 80, respiratory rate 23, temperature 98.3 degrees Fahrenheit. CHEST:  Decreased air entry, few rhonchi. CARDIOVASCULAR:  First and second heart sounds heard.  No murmur.  No gallop. ABDOMEN:  Soft and lax.  Bowel sounds positive.  No mass or organomegaly. EXTREMITIES:  No leg edema.  LABORATORY DATA:  Sodium 135, potassium 4.3, chloride 96, carbon dioxide 29, glucose 99, BUN 28, creatinine 1.35, calcium 10.4.  CBC, WBC 7.9 hemoglobin 11.8, hematocrit 34.0, and platelets 101.  ASSESSMENT: 1. Fever probably secondary to early pneumonia. 2. Metabolic encephalopathy. 3. Dementia. 4. Renal insufficiency. 5. Hypothyroidism. 6. History of congestive heart failure.  PLAN:  We will continue the patient on empiric IV antibiotics.  We will continue to hydrate gradually.  Continue regular treatment and supportive care.     [name] [name] D. [name], MD     TDF/MEDQ  D:  11/21/2011  T:  11/21/2011  Job:  [id number]

## 2011-11-21 NOTE — Consult Note (Signed)
ANTICOAGULATION CONSULT NOTE - Initial Consult  Pharmacy Consult for Lovenox Indication: VTE prophylaxis  Allergies  Allergen Reactions  . Codeine   . Penicillins Other (See Comments)    Unknown reactions   . Sulfa Antibiotics Other (See Comments)    Unknown reaction    Patient Measurements: Height: 5\' 9"  (175.3 cm) Weight: 180 lb 8.9 oz (81.9 kg) IBW/kg (Calculated) : 70.7   Vital Signs: Temp: 98.3 F (36.8 C) (07/05 0622) Temp src: Oral (07/05 0622) BP: 95/63 mmHg (07/05 0622) Pulse Rate: 80  (07/05 0622)  Labs:  Basename 11/21/11 0551 11/20/11 1725  HGB 11.1* 12.0*  HCT 34.0* 36.9*  PLT 101* 116*  APTT -- --  LABPROT -- --  INR -- --  HEPARINUNFRC -- --  CREATININE 1.35 1.34  CKTOTAL -- --  CKMB -- --  TROPONINI -- <0.30    Estimated Creatinine Clearance: 35.6 ml/min (by C-G formula based on Cr of 1.35).   Medical History: Past Medical History  Diagnosis Date  . COPD (chronic obstructive pulmonary disease)   . Shortness of breath   . Asthma   . DEMENTIA     short term  . Hypertension   . Arthritis   . Hypothyroidism   . CHF (congestive heart failure)   . BPH (benign prostatic hyperplasia)   . Hiatal hernia   . Neuropathy    Medications:  Scheduled:    . acetaminophen  650 mg Oral Once  . albuterol  2.5 mg Nebulization Q6H  . albuterol      . ALPRAZolam  0.375 mg Oral QHS  . aspirin EC  81 mg Oral Daily  . digoxin  125 mcg Oral Daily  . diltiazem  120 mg Oral Daily  . enoxaparin (LOVENOX) injection  40 mg Subcutaneous Q24H  . furosemide  40 mg Oral Daily  . gabapentin  600 mg Oral QID  . levofloxacin (LEVAQUIN) IV  500 mg Intravenous Q24H  . levothyroxine  50 mcg Oral QAC breakfast  . lisinopril  20 mg Oral Daily  . loratadine  10 mg Oral Daily  . omega-3 acid ethyl esters  1 g Oral Daily  . PARoxetine  40 mg Oral QHS  . polyethylene glycol powder  17 g Oral Daily  . potassium chloride  10 mEq Oral Daily  . senna  3 tablet Oral QHS    . Tamsulosin HCl  0.4 mg Oral Daily  . vitamin C  1,000 mg Oral Daily  . vitamin E  400 Units Oral Daily  . DISCONTD: Lecithin  1,200 mg Oral Daily  . DISCONTD: quinapril  20 mg Oral Daily    Assessment: 78yo M with clcr > 30 ml/min  Goal of Therapy:  VTE prophylaxis Monitor platelets by anticoagulation protocol: Yes   Plan:  Lovenox 40mg  sq q24hrs CBC per protocol  Valrie Hart A 11/21/2011,7:37 AM

## 2011-11-21 NOTE — Clinical Social Work Psychosocial (Signed)
Clinical Social Work Department BRIEF PSYCHOSOCIAL ASSESSMENT 11/21/2011  Patient:  [name],[name]     Account Number:  [id number]     Admit date:  11/20/2011  Clinical Social Worker:  Nancie Neas  Date/Time:  11/21/2011 10:45 AM  Referred by:  Physician  Date Referred:  11/21/2011 Referred for  ALF Placement   Other Referral:   Interview type:  Family Other interview type:   son- [name]    PSYCHOSOCIAL DATA Living Status:  FACILITY Admitted from facility:  [facility] Level of care:  Assisted Living Primary support name:  [name] Primary support relationship to patient:  CHILD, ADULT Degree of support available:   very supportive    CURRENT CONCERNS Current Concerns  Post-Acute Placement   Other Concerns:    SOCIAL WORK ASSESSMENT / PLAN CSW spoke with pt's son [name] as pt asleep at time of visit.  [name] reports he is at [employer] and will be home tomorrow.  Family is involved and supportive.  Pt has been resident at [facility] for less than a year. Per Lupita Leash at facility, pt generally uses a wheelchair but can use a walker as well.  He does not have home health currently and okay for return at d/c. [name] confirms d/c plan. [facility] if home health is needed.   Assessment/plan status:  Psychosocial Support/Ongoing Assessment of Needs Other assessment/ plan:   Information/referral to community resources:   [facility]    PATIENT'S/FAMILY'S RESPONSE TO PLAN OF CARE: Pt unable to discuss plan of care at this time.  Family report positive feelings regarding return to [facility] when medically stable.  CSW will continue to follow.        Derenda Fennel, [place] [phone]

## 2011-11-22 LAB — URINE CULTURE: Culture: NO GROWTH

## 2011-11-22 MED ORDER — LEVOFLOXACIN 500 MG PO TABS: 500.0000 mg | ORAL_TABLET | Freq: Every day | ORAL | Status: DC

## 2011-11-22 MED ORDER — IBUPROFEN 800 MG PO TABS
400.0000 mg | ORAL_TABLET | Freq: Four times a day (QID) | ORAL | Status: DC | PRN
  Administered 2011-11-22 – 2011-11-23 (×2): 400 mg via ORAL
  Filled 2011-11-22 (×2): qty 1

## 2011-11-22 MED ORDER — LEVOFLOXACIN 500 MG PO TABS
750.0000 mg | ORAL_TABLET | ORAL | Status: DC
  Administered 2011-11-22 – 2011-11-24 (×2): 750 mg via ORAL
  Filled 2011-11-22: qty 1
  Filled 2011-11-22: qty 3

## 2011-11-22 NOTE — Plan of Care (Signed)
Problem: Phase III Progression Outcomes Goal: Pain controlled on oral analgesia Outcome: Not Applicable Date Met:  11/22/11 Pt continues to deny any pain.

## 2011-11-22 NOTE — Progress Notes (Signed)
Pharmacy Consult: Lovenox for VTE prophylaxis.  Patient Measurements: Height: 5\' 9"  (175.3 cm) Weight: 180 lb 8.9 oz (81.9 kg) IBW/kg (Calculated) : 70.7  Body mass index is 26.66 kg/(m^2).   VITALS Filed Vitals:   11/22/11 0606  BP: 121/69  Pulse: 68  Temp: 98.9 F (37.2 C)  Resp: 18    INR Last Three Days: No results found for this basename: INR:3 in the last 72 hours  CBC:    Component Value Date/Time   WBC 7.9 11/21/2011 0551   RBC 3.82* 11/21/2011 0551   HGB 11.1* 11/21/2011 0551   HCT 34.0* 11/21/2011 0551   PLT 101* 11/21/2011 0551   MCV 89.0 11/21/2011 0551   MCH 29.1 11/21/2011 0551   MCHC 32.6 11/21/2011 0551   RDW 15.4 11/21/2011 0551   LYMPHSABS 0.9 11/20/2011 1725   MONOABS 1.0 11/20/2011 1725   EOSABS 0.1 11/20/2011 1725   BASOSABS 0.0 11/20/2011 1725    RENAL FUNCTION: Estimated Creatinine Clearance: 35.6 ml/min (by C-G formula based on Cr of 1.35).  Assessment: Dose stable for age, weight, renal function and indication.  Plan: Sign off.  Mady Gemma, [facility] 11/22/2011 9:05 AM

## 2011-11-22 NOTE — Progress Notes (Signed)
NAMEJAMIEON, LANNEN                ACCOUNT NO.:  [id number]  MEDICAL RECORD NO.:  [id number]  LOCATION:  A329                          FACILITY:  [facility]  PHYSICIAN:  Awilda Covin D. [name], MD   DATE OF BIRTH:  1917/06/30  DATE OF PROCEDURE:  11/22/2011 DATE OF DISCHARGE:                                PROGRESS NOTE   SUBJECTIVE:  The patient feels better.  His appetite has improved.  His fever has subsided.  OBJECTIVE:  GENERAL:  The patient is more alert, awake, and responding to verbal communication. VITAL SIGNS:  Blood pressure 121/69, pulse 70, respiratory rate 18, temperature 98.8 degrees Fahrenheit. CHEST:  Decreased air entry, few rhonchi. CARDIOVASCULAR SYSTEM:  First and second heart sounds heard.  No murmur. No gallop. ABDOMEN:  Soft and lax.  Bowel sound is positive.  No mass or organomegaly. EXTREMITIES:  No leg edema.  ASSESSMENT: 1. Fever. 2. Probably metabolic encephalopathy. 3. Upper respiratory tract infection versus early pneumonia. 4. Dementia. 5. Hypothyroidism. 6. History of congestive heart failure.  PLAN:  We will continue the patient on empiric IV antibiotics.  We will continue regular medication and supportive care.     Falicity Sheets D. [name], MD     TDF/MEDQ  D:  11/22/2011  T:  11/22/2011  Job:  [id number]

## 2011-11-22 NOTE — Progress Notes (Addendum)
PHARMACIST - PHYSICIAN COMMUNICATION DR:   [name] CONCERNING: Antibiotic IV to Oral Route Change Policy  RECOMMENDATION: This patient is receiving Levaquin by the intravenous route.  Based on criteria approved by the Pharmacy and Therapeutics Committee, the antibiotic(s) is/are being converted to the equivalent oral dose form(s).   DESCRIPTION: These criteria include:  Patient being treated for a respiratory tract infection, urinary tract infection, or cellulitis  The patient is not neutropenic and does not exhibit a GI malabsorption state  The patient is eating (either orally or via tube) and/or has been taking other orally administered medications for a least 24 hours  The patient is improving clinically and has a Tmax < 100.5  If you have questions about this conversion, please contact the Pharmacy Department  [x]   [phone] )  [facility] []   [phone] )  [facility]  []   [phone] )  [facility] []   [phone] )  [facility]   Note: Also adjusted dose to 750mg  po every 48 hours due to renal function. Estimated Creatinine Clearance: 35.6 ml/min (by C-G formula based on Cr of 1.35).  Mady Gemma, [facility] 11/22/2011 12:01 PM

## 2011-11-23 ENCOUNTER — Inpatient Hospital Stay (HOSPITAL_COMMUNITY): Payer: Medicare Other

## 2011-11-23 LAB — URINE MICROSCOPIC-ADD ON

## 2011-11-23 LAB — URINALYSIS, ROUTINE W REFLEX MICROSCOPIC
Ketones, ur: NEGATIVE mg/dL
Nitrite: NEGATIVE
Protein, ur: 100 mg/dL — AB

## 2011-11-23 LAB — BASIC METABOLIC PANEL
CO2: 28 mEq/L (ref 19–32)
Glucose, Bld: 101 mg/dL — ABNORMAL HIGH (ref 70–99)
Potassium: 4.3 mEq/L (ref 3.5–5.1)
Sodium: 129 mEq/L — ABNORMAL LOW (ref 135–145)

## 2011-11-23 MED ORDER — SODIUM CHLORIDE 0.9 % IJ SOLN
INTRAMUSCULAR | Status: AC
  Administered 2011-11-23: 07:00:00
  Filled 2011-11-23: qty 3

## 2011-11-23 NOTE — Progress Notes (Signed)
Pt labs results received. Dr. Juanetta Gosling made aware. New orders for fluid restriction 1000cc and repeat BMP in AM.

## 2011-11-23 NOTE — Progress Notes (Signed)
Pt observed with increase confusion, increase weakness, coughing up tan sputum. Dr. Juanetta Gosling made aware. Pt is currently on Antibiotics. New orders for Bmet, dig level, and chest xray.

## 2011-11-23 NOTE — Progress Notes (Signed)
Pt continues to expel mod amount of sputum. It is thick, tan, with slight amount of blood in sputum. Pt coughs up sputum very frequently. Will pass this on for nursing to continue to monitor. [name]

## 2011-11-23 NOTE — Progress Notes (Signed)
NAMENAMIR, NETO                ACCOUNT NO.:  [id number]  MEDICAL RECORD NO.:  [id number]  LOCATION:  A329                          FACILITY:  [facility]  PHYSICIAN:  Keno Caraway D. [name], MD   DATE OF BIRTH:  05/15/1918  DATE OF PROCEDURE:  11/23/2011 DATE OF DISCHARGE:                                PROGRESS NOTE   SUBJECTIVE:  The patient feels better today.  No fever or chills.  He has pain in his left shoulder.  No history of fall or accidental injury.  OBJECTIVE:  GENERAL:  The patient is alert, awake, and sick looking. VITAL SIGNS:  Blood pressure 100/59, pulse 54, respiratory rate 18, temperature 97.9 degrees Fahrenheit. CHEST:  Decreased air entry, few rhonchi.  CARDIOVASCULAR:  First and second heart sound heard.  No murmur.  No gallop. ABDOMEN:  Soft and lax.  Bowel sound is positive.  No mass or organomegaly. EXTREMITIES:  No leg edema.  There is stiffness and tenderness of the right shoulder.  ASSESSMENT: 1. Fever. 2. Presumptive early pneumonia. 3. Dementia. 4. Metabolic encephalopathy. 5. Degenerative joint disease. 6. Hypothyroidism. 7. Congestive heart failure.  PLAN:  Continue the patient on empiric IV antibiotics.  Continue nebulizer treatment.  We will do x-ray of the left shoulder for evaluation of pain and stiffness.  Continue supportive care.     Oyuki Hogan D. [name], MD     TDF/MEDQ  D:  11/23/2011  T:  11/23/2011  Job:  [id number]

## 2011-11-24 LAB — BASIC METABOLIC PANEL
Chloride: 100 mEq/L (ref 96–112)
Creatinine, Ser: 1.62 mg/dL — ABNORMAL HIGH (ref 0.50–1.35)
GFR calc Af Amer: 41 mL/min — ABNORMAL LOW (ref >90)
Potassium: 3.9 mEq/L (ref 3.5–5.1)

## 2011-11-24 NOTE — Progress Notes (Signed)
[name], [name]                ACCOUNT NO.:  [id number]  MEDICAL RECORD NO.:  [id number]  LOCATION:  A329                          FACILITY:  [facility]  PHYSICIAN:  Ireanna Finlayson D. [name], MD   DATE OF BIRTH:  1917-09-22  DATE OF PROCEDURE:  11/24/2011 DATE OF DISCHARGE:                                PROGRESS NOTE   SUBJECTIVE:  The patient feels better.  His fever has subsided. Continue to complain of left shoulder pain.  OBJECTIVE:  GENERAL:  The patient is alert, awake, and sick looking. VITAL SIGNS:  Blood pressure 126/68, pulse 101, respiratory rate 20, temperature 99 degrees Fahrenheit. CHEST:  Decreased air entry, few rhonchi. CARDIOVASCULAR SYSTEM:  First and second heart sounds heard.  No murmur. No gallop. ABDOMEN:  Soft and lax.  Bowel sound is positive.  No mass or organomegaly. EXTREMITIES:  No leg edema.  LABORATORY DATA:  Sodium 136, potassium 3.9, chloride 100, carbon dioxide 30, glucose 96, BUN 32, creatinine 1.62, calcium 9.5.  X-ray of the left shoulder shows severe degenerative changes.  No acute finding.  ASSESSMENT: 1. Fever, clinically improved. 2. Probably early pneumonia. 3. Degenerative joint disease. 4. Dementia. 5. Deconditioning. 6. History of congestive heart failure.  PLAN:  We will continue the patient on nebulizer treatment.  Continue IV antibiotics.  We will do physical therapy evaluation and continue his regular medications.     Mohit Zirbes D. [name], MD     TDF/MEDQ  D:  11/24/2011  T:  11/24/2011  Job:  [id number]

## 2011-11-24 NOTE — Evaluation (Signed)
Physical Therapy Evaluation Patient Details Name: [name] MRN: [id number] DOB: 04-13-1918 Today's Date: 11/24/2011 Time: 1191-4782 PT Time Calculation (min): 36 min  PT Assessment / Plan / Recommendation Clinical Impression  Pt very cooperative, mildly deconditioned.  He requires min to SBA for transfers and gait with walker.  He should be able to transfer to [facility] with HHPT.    PT Assessment  Patient needs continued PT services    Follow Up Recommendations  Home health PT    Barriers to Discharge None      Equipment Recommendations  Defer to next venue    Recommendations for Other Services     Frequency Min 3X/week    Precautions / Restrictions Precautions Precautions: Fall Restrictions Weight Bearing Restrictions: No   Pertinent Vitals/Pain       Mobility  Bed Mobility Bed Mobility: Supine to Sit;Sit to Supine Supine to Sit: 6: Modified independent (Device/Increase time);HOB elevated Sit to Supine: 6: Modified independent (Device/Increase time);HOB elevated Transfers Transfers: Sit to Stand;Stand to Sit Sit to Stand: 5: Supervision Stand to Sit: 5: Supervision Ambulation/Gait Ambulation/Gait Assistance: 5: Supervision Ambulation Distance (Feet): 30 Feet Assistive device: Rolling walker Gait Pattern: Step-through pattern;Trunk flexed Stairs: No Wheelchair Mobility Wheelchair Mobility: No    Exercises     PT Diagnosis: Difficulty walking;Generalized weakness  PT Problem List: Decreased strength;Decreased activity tolerance;Decreased mobility;Decreased safety awareness PT Treatment Interventions: Gait training;Therapeutic activities;Therapeutic exercise;Patient/family education   PT Goals Acute Rehab PT Goals PT Goal Formulation: With patient/family Time For Goal Achievement: 12/01/11 Potential to Achieve Goals: Good Pt will Ambulate: 51 - 150 feet;with supervision;with rolling walker PT Goal: Ambulate - Progress: Goal set today  Visit Information  Last PT Received On: 11/24/11    Subjective Data  Subjective: I haven't been out of bed in 12 days Patient Stated Goal: none stated   Prior Functioning  Home Living Available Help at Discharge: Personal care attendant Type of Home: Assisted living Home Access: Level entry Home Layout: One level Home Adaptive Equipment: Walker - rolling;Wheelchair - manual Prior Function Level of Independence: Needs assistance Able to Take Stairs?: No Vocation: Retired Musician: No difficulties Dominant Hand: Right    Cognition  Overall Cognitive Status: History of cognitive impairments - at baseline Arousal/Alertness: Awake/alert Orientation Level: Appears intact for tasks assessed Behavior During Session: [facility] for tasks performed    Extremity/Trunk Assessment Right Upper Extremity Assessment RUE ROM/Strength/Tone: [facility] for tasks assessed Left Upper Extremity Assessment LUE ROM/Strength/Tone: Deficits LUE ROM/Strength/Tone Deficits: limited shoulder ROM and strength with pain on active movement Right Lower Extremity Assessment RLE ROM/Strength/Tone: Deficits RLE ROM/Strength/Tone Deficits: generally 3-/5 RLE Sensation: WFL - Light Touch;WFL - Proprioception RLE Coordination: WFL - gross motor Left Lower Extremity Assessment LLE ROM/Strength/Tone: Deficits LLE ROM/Strength/Tone Deficits: generally 3-/5 LLE Sensation: WFL - Light Touch;WFL - Proprioception LLE Coordination: WFL - gross motor Trunk Assessment Trunk Assessment: Kyphotic   Balance Balance Balance Assessed: No  End of Session PT - End of Session Equipment Utilized During Treatment: Gait belt Activity Tolerance: Patient tolerated treatment well Patient left: in chair;with call bell/phone within reach;with chair alarm set;with family/visitor present Nurse Communication: Mobility status  GP     Konrad Penta 11/24/2011, 12:33 PM

## 2011-11-25 LAB — URINE CULTURE: Colony Count: 15000

## 2011-11-25 LAB — CULTURE, BLOOD (ROUTINE X 2): Culture: NO GROWTH

## 2011-11-25 MED ORDER — SODIUM CHLORIDE 0.9 % IV SOLN: INTRAVENOUS | Status: DC

## 2011-11-25 MED ORDER — ACETAMINOPHEN 500 MG PO TABS: 1000.0000 mg | ORAL_TABLET | ORAL | Status: DC | PRN

## 2011-11-25 MED ORDER — ONDANSETRON HCL 4 MG/2ML IJ SOLN: 4.0000 mg | Freq: Three times a day (TID) | INTRAMUSCULAR | Status: DC | PRN

## 2011-11-25 MED ORDER — LEVOFLOXACIN 500 MG PO TABS: 500.0000 mg | ORAL_TABLET | Freq: Every day | ORAL | Status: AC

## 2011-11-25 NOTE — Discharge Summary (Signed)
[name], [name]                ACCOUNT NO.:  [id number]  MEDICAL RECORD NO.:  [id number]  LOCATION:  A329                          FACILITY:  [facility]  PHYSICIAN:  Diavian Furgason D. [name], MD   DATE OF BIRTH:  06-19-1917  DATE OF ADMISSION:  11/20/2011 DATE OF DISCHARGE:  07/09/2013LH                              DISCHARGE SUMMARY   DISCHARGE DIAGNOSES: 1. Fever, etiology not clear. 2. Probably early pneumonia. 3. Chronic obstructive pulmonary disease. 4. Dementia. 5. Hypertension. 6. Hypothyroidism. 7. Congestive heart failure. 8. Benign prostatic hypertrophy. 9. Degenerative joint disease. 10.Neuropathy.  DISCHARGE MEDICATIONS: 1. Levaquin 500 mg p.o. daily. 2. Xanax 0.375 mg p.o. at bedtime. 3. Aspirin 81 mg daily. 4. Calcium 600 with vitamin D 1 tablet daily. 5. Digoxin 0.125 mg daily. 6. Fish oil with omega-3 fatty acid 1000 mg daily. 7. Lasix 40 mg daily. 8. Gabapentin 600 mg daily. 9. Lecithin 1200 mg capsule daily. 10.Levothyroxine 500 mcg daily. 11.Loratadine 10 mg daily. 12.Paxil 40 mg daily. 13.MiraLAX 17 g daily as needed. 14.Potassium chloride 10 mEq daily. 15.Accupril 20 mg daily. 16.Senna 3 tablets at bedtime. 17.Flomax 0.4 mg daily. 18.Diltiazem 120 mg daily. 19.Ascorbic acid 1000 mg daily. 20.Vitamin E 400 mg daily. 21.Advil PM 2 tablets at bedtime p.r.n. for sleep.  DISPOSITION:  The patient will be discharged to [facility] in stable condition.  DISCHARGE INSTRUCTIONS:  The patient will be followed in the office in 1 week's  time.  He is going to continue his current medication including oral antibiotics.  HOSPITAL COURSE:  This is a 78 year old male patient with history of multiple medical illnesses who was admitted due to fever and generalized weakness.  He has some change in mental status.  His chest x-ray was clear.  However, the patient had symptoms of upper respiratory tract infection and he was getting inhalers and antihistaminic  treatment in the assisted living.  The patient was admitted and gradually rehydrated. He was empirically started on IV antibiotics for possible early pneumonia.  Over the hospital stay, the patient improved.  His blood and urine culture was negative.  The patient is going to be discharged on oral antibiotics to be continued in the assisted living.     Maleia Weems D. [name], MD     TDF/MEDQ  D:  11/25/2011  T:  11/25/2011  Job:  [id number]

## 2011-11-25 NOTE — Clinical Social Work Note (Signed)
Pt d/c today by MD back to [facility].  Pt, pt's son, and facility aware and agreeable.  Pt to transfer with son.  [facility] aware prescription was faxed to [facility] per MD.  Pt on regular diet per RN.  Misty Stanley, RN reviewed [facility] for accuracy.  Derenda Fennel, [place] [phone]

## 2011-11-25 NOTE — Progress Notes (Signed)
Discharge to [facility], out in stable condition via w/c with staff, discharge instruction packet given to son and instructed to hand to caretaker at [facility], verbalized understanding.

## 2011-11-25 NOTE — Care Management Note (Signed)
    Page 1 of 1   11/25/2011     1:47:57 PM   CARE MANAGEMENT NOTE 11/25/2011  Patient:  [name], [name]   Account Number:  [id number]  Date Initiated:  11/25/2011  Documentation initiated by:  Sharrie Rothman  Subjective/Objective Assessment:   Pt admitted from [facility] with fever and possible pneumonia. Pt will return to facility at discharge. Pt does require assistance with ADL's.     Action/Plan:   CSW is aware and will arrange discharge to facility. No CM needs noted at this time.   Anticipated DC Date:  11/25/2011   Anticipated DC Plan:  ASSISTED LIVING / REST HOME  In-house referral  Clinical Social Worker      DC Planning Services  CM consult      Choice offered to / List presented to:             Status of service:  Completed, signed off Medicare Important Message given?   (If response is "NO", the following Medicare IM given date fields will be blank) Date Medicare IM given:   Date Additional Medicare IM given:    Discharge Disposition:  ASSISTED LIVING  Per UR Regulation:    If discussed at Long Length of Stay Meetings, dates discussed:    Comments:  11/25/11 1347 Arlyss Queen, RN BSN CM

## 2011-12-07 ENCOUNTER — Other Ambulatory Visit: Payer: Self-pay

## 2011-12-08 ENCOUNTER — Inpatient Hospital Stay (HOSPITAL_COMMUNITY): Payer: Medicare Other

## 2011-12-08 ENCOUNTER — Inpatient Hospital Stay (HOSPITAL_COMMUNITY)
Admission: EM | Admit: 2011-12-08 | Discharge: 2011-12-16 | DRG: 179 | Disposition: A | Discharge status: Skilled Nursing Facility | Payer: Medicare Other | Attending: Internal Medicine | Admitting: Internal Medicine

## 2011-12-08 DIAGNOSIS — N400 Enlarged prostate without lower urinary tract symptoms: Secondary | ICD-10-CM | POA: Diagnosis present

## 2011-12-08 DIAGNOSIS — E039 Hypothyroidism, unspecified: Secondary | ICD-10-CM | POA: Diagnosis present

## 2011-12-08 DIAGNOSIS — F0390 Unspecified dementia without behavioral disturbance: Secondary | ICD-10-CM | POA: Diagnosis present

## 2011-12-08 DIAGNOSIS — I10 Essential (primary) hypertension: Secondary | ICD-10-CM | POA: Diagnosis present

## 2011-12-08 DIAGNOSIS — Z7982 Long term (current) use of aspirin: Secondary | ICD-10-CM

## 2011-12-08 DIAGNOSIS — Z79899 Other long term (current) drug therapy: Secondary | ICD-10-CM

## 2011-12-08 DIAGNOSIS — J4489 Other specified chronic obstructive pulmonary disease: Secondary | ICD-10-CM | POA: Diagnosis present

## 2011-12-08 DIAGNOSIS — M1990 Unspecified osteoarthritis, unspecified site: Secondary | ICD-10-CM | POA: Diagnosis present

## 2011-12-08 DIAGNOSIS — I509 Heart failure, unspecified: Secondary | ICD-10-CM | POA: Diagnosis present

## 2011-12-08 DIAGNOSIS — Z8701 Personal history of pneumonia (recurrent): Secondary | ICD-10-CM

## 2011-12-08 DIAGNOSIS — J189 Pneumonia, unspecified organism: Secondary | ICD-10-CM | POA: Diagnosis present

## 2011-12-08 DIAGNOSIS — G589 Mononeuropathy, unspecified: Secondary | ICD-10-CM | POA: Diagnosis present

## 2011-12-08 DIAGNOSIS — J690 Pneumonitis due to inhalation of food and vomit: Principal | ICD-10-CM | POA: Diagnosis present

## 2011-12-08 DIAGNOSIS — J449 Chronic obstructive pulmonary disease, unspecified: Secondary | ICD-10-CM | POA: Diagnosis present

## 2011-12-08 LAB — URINALYSIS, ROUTINE W REFLEX MICROSCOPIC
Bilirubin Urine: NEGATIVE
Nitrite: NEGATIVE
Protein, ur: NEGATIVE mg/dL
Specific Gravity, Urine: 1.01 (ref 1.005–1.030)
Urobilinogen, UA: 0.2 mg/dL (ref 0.0–1.0)

## 2011-12-08 LAB — COMPREHENSIVE METABOLIC PANEL
ALT: 16 U/L (ref 0–53)
AST: 28 U/L (ref 0–37)
CO2: 32 mEq/L (ref 19–32)
Chloride: 96 mEq/L (ref 96–112)
GFR calc non Af Amer: 40 mL/min — ABNORMAL LOW (ref >90)
Sodium: 134 mEq/L — ABNORMAL LOW (ref 135–145)
Total Bilirubin: 1.1 mg/dL (ref 0.3–1.2)

## 2011-12-08 LAB — CBC WITH DIFFERENTIAL/PLATELET
Basophils Absolute: 0 10*3/uL (ref 0.0–0.1)
Basophils Relative: 0 % (ref 0–1)
Eosinophils Absolute: 0.4 10*3/uL (ref 0.0–0.7)
Eosinophils Relative: 4 % (ref 0–5)
HCT: 35.1 % — ABNORMAL LOW (ref 39.0–52.0)
Hemoglobin: 11.7 g/dL — ABNORMAL LOW (ref 13.0–17.0)
Lymphs Abs: 1 10*3/uL (ref 0.7–4.0)
MCH: 29.1 pg (ref 26.0–34.0)
MCHC: 33.3 g/dL (ref 30.0–36.0)
MCV: 87.3 fL (ref 78.0–100.0)
Monocytes Absolute: 1 10*3/uL (ref 0.1–1.0)
Monocytes Relative: 10 % (ref 3–12)
RBC: 4.02 MIL/uL — ABNORMAL LOW (ref 4.22–5.81)

## 2011-12-08 LAB — MRSA PCR SCREENING: MRSA by PCR: NEGATIVE

## 2011-12-08 LAB — BASIC METABOLIC PANEL
BUN: 37 mg/dL — ABNORMAL HIGH (ref 6–23)
Chloride: 95 mEq/L — ABNORMAL LOW (ref 96–112)
Creatinine, Ser: 1.34 mg/dL (ref 0.50–1.35)
GFR calc Af Amer: 52 mL/min — ABNORMAL LOW (ref >90)
Glucose, Bld: 181 mg/dL — ABNORMAL HIGH (ref 70–99)
Potassium: 4.3 mEq/L (ref 3.5–5.1)

## 2011-12-08 LAB — URINE MICROSCOPIC-ADD ON

## 2011-12-08 MED ORDER — CALCIUM CARBONATE-VITAMIN D 500-200 MG-UNIT PO TABS
1.0000 | ORAL_TABLET | Freq: Every day | ORAL | Status: DC
  Administered 2011-12-08 – 2011-12-16 (×9): 1 via ORAL
  Filled 2011-12-08 (×9): qty 1

## 2011-12-08 MED ORDER — GABAPENTIN 300 MG PO CAPS
600.0000 mg | ORAL_CAPSULE | Freq: Four times a day (QID) | ORAL | Status: DC
  Administered 2011-12-08 – 2011-12-16 (×29): 600 mg via ORAL
  Filled 2011-12-08 (×30): qty 2

## 2011-12-08 MED ORDER — SENNA 8.6 MG PO TABS
3.0000 | ORAL_TABLET | Freq: Every day | ORAL | Status: DC
  Administered 2011-12-08 – 2011-12-15 (×8): 25.8 mg via ORAL
  Filled 2011-12-08: qty 3
  Filled 2011-12-08: qty 1
  Filled 2011-12-08 (×2): qty 2
  Filled 2011-12-08: qty 3
  Filled 2011-12-08 (×2): qty 1
  Filled 2011-12-08: qty 4
  Filled 2011-12-08: qty 1
  Filled 2011-12-08: qty 2
  Filled 2011-12-08 (×2): qty 3

## 2011-12-08 MED ORDER — TAMSULOSIN HCL 0.4 MG PO CAPS
0.4000 mg | ORAL_CAPSULE | Freq: Every day | ORAL | Status: DC
  Administered 2011-12-08 – 2011-12-15 (×8): 0.4 mg via ORAL
  Filled 2011-12-08 (×8): qty 1

## 2011-12-08 MED ORDER — OMEGA-3-ACID ETHYL ESTERS 1 G PO CAPS
1.0000 g | ORAL_CAPSULE | Freq: Every day | ORAL | Status: DC
  Administered 2011-12-08 – 2011-12-16 (×9): 1 g via ORAL
  Filled 2011-12-08 (×9): qty 1

## 2011-12-08 MED ORDER — POTASSIUM CHLORIDE CRYS ER 10 MEQ PO TBCR
10.0000 meq | EXTENDED_RELEASE_TABLET | Freq: Every day | ORAL | Status: DC
  Administered 2011-12-08 – 2011-12-16 (×8): 10 meq via ORAL
  Filled 2011-12-08 (×12): qty 1

## 2011-12-08 MED ORDER — PAROXETINE HCL 20 MG PO TABS
40.0000 mg | ORAL_TABLET | Freq: Every day | ORAL | Status: DC
  Administered 2011-12-08 – 2011-12-16 (×9): 40 mg via ORAL
  Filled 2011-12-08 (×9): qty 2

## 2011-12-08 MED ORDER — VITAMIN C 500 MG PO TABS
500.0000 mg | ORAL_TABLET | Freq: Every day | ORAL | Status: DC
  Administered 2011-12-08 – 2011-12-16 (×9): 500 mg via ORAL
  Filled 2011-12-08 (×9): qty 1

## 2011-12-08 MED ORDER — FUROSEMIDE 40 MG PO TABS
40.0000 mg | ORAL_TABLET | Freq: Every day | ORAL | Status: DC
  Administered 2011-12-08 – 2011-12-16 (×8): 40 mg via ORAL
  Filled 2011-12-08 (×8): qty 1

## 2011-12-08 MED ORDER — POLYETHYLENE GLYCOL 3350 17 G PO PACK
17.0000 g | PACK | Freq: Every day | ORAL | Status: DC
  Administered 2011-12-08 – 2011-12-16 (×9): 17 g via ORAL
  Filled 2011-12-08 (×9): qty 1

## 2011-12-08 MED ORDER — VANCOMYCIN HCL 1000 MG IV SOLR
1250.0000 mg | INTRAVENOUS | Status: DC
  Administered 2011-12-08 – 2011-12-10 (×3): 1250 mg via INTRAVENOUS
  Filled 2011-12-08 (×5): qty 1250

## 2011-12-08 MED ORDER — DILTIAZEM HCL ER COATED BEADS 120 MG PO CP24
120.0000 mg | ORAL_CAPSULE | Freq: Every day | ORAL | Status: DC
  Administered 2011-12-08 – 2011-12-16 (×9): 120 mg via ORAL
  Filled 2011-12-08 (×11): qty 1

## 2011-12-08 MED ORDER — SODIUM CHLORIDE 0.9 % IV SOLN
INTRAVENOUS | Status: DC
  Administered 2011-12-08: 22:00:00 via INTRAVENOUS

## 2011-12-08 MED ORDER — LEVOTHYROXINE SODIUM 50 MCG PO TABS
50.0000 ug | ORAL_TABLET | Freq: Every day | ORAL | Status: DC
  Administered 2011-12-08 – 2011-12-16 (×9): 50 ug via ORAL
  Filled 2011-12-08 (×9): qty 1

## 2011-12-08 MED ORDER — IPRATROPIUM BROMIDE 0.02 % IN SOLN
0.5000 mg | RESPIRATORY_TRACT | Status: DC
  Administered 2011-12-08 (×4): 0.5 mg via RESPIRATORY_TRACT
  Filled 2011-12-08 (×4): qty 2.5

## 2011-12-08 MED ORDER — ALPRAZOLAM 0.25 MG PO TABS
0.3750 mg | ORAL_TABLET | Freq: Every day | ORAL | Status: DC
  Administered 2011-12-08 – 2011-12-15 (×8): 0.375 mg via ORAL
  Filled 2011-12-08 (×8): qty 2

## 2011-12-08 MED ORDER — LISINOPRIL 10 MG PO TABS
20.0000 mg | ORAL_TABLET | Freq: Every day | ORAL | Status: DC
  Administered 2011-12-08 – 2011-12-16 (×9): 20 mg via ORAL
  Filled 2011-12-08 (×9): qty 2

## 2011-12-08 MED ORDER — LEVOFLOXACIN IN D5W 500 MG/100ML IV SOLN
500.0000 mg | INTRAVENOUS | Status: AC
  Administered 2011-12-08 – 2011-12-11 (×4): 500 mg via INTRAVENOUS
  Filled 2011-12-08 (×5): qty 100

## 2011-12-08 MED ORDER — DEXTROSE 5 % IV SOLN
1.0000 g | INTRAVENOUS | Status: DC
  Administered 2011-12-08 – 2011-12-15 (×8): 1 g via INTRAVENOUS
  Filled 2011-12-08 (×9): qty 10

## 2011-12-08 MED ORDER — ENOXAPARIN SODIUM 40 MG/0.4ML ~~LOC~~ SOLN
40.0000 mg | SUBCUTANEOUS | Status: DC
  Administered 2011-12-08 – 2011-12-15 (×8): 40 mg via SUBCUTANEOUS
  Filled 2011-12-08 (×8): qty 0.4

## 2011-12-08 MED ORDER — OMEGA-3 FATTY ACIDS 1000 MG PO CAPS: 1.0000 g | ORAL_CAPSULE | Freq: Every day | ORAL | Status: DC

## 2011-12-08 MED ORDER — ALBUTEROL SULFATE (5 MG/ML) 0.5% IN NEBU
2.5000 mg | INHALATION_SOLUTION | RESPIRATORY_TRACT | Status: DC
  Administered 2011-12-08 (×4): 2.5 mg via RESPIRATORY_TRACT
  Filled 2011-12-08 (×4): qty 0.5

## 2011-12-08 MED ORDER — VITAMIN E 180 MG (400 UNIT) PO CAPS
400.0000 [IU] | ORAL_CAPSULE | Freq: Every day | ORAL | Status: DC
  Administered 2011-12-08 – 2011-12-16 (×9): 400 [IU] via ORAL
  Filled 2011-12-08 (×11): qty 1

## 2011-12-08 MED ORDER — DIGOXIN 125 MCG PO TABS
125.0000 ug | ORAL_TABLET | Freq: Every day | ORAL | Status: DC
  Administered 2011-12-08 – 2011-12-16 (×9): 125 ug via ORAL
  Filled 2011-12-08 (×9): qty 1

## 2011-12-08 MED ORDER — ASPIRIN EC 81 MG PO TBEC
81.0000 mg | DELAYED_RELEASE_TABLET | Freq: Every day | ORAL | Status: DC
  Administered 2011-12-08 – 2011-12-16 (×9): 81 mg via ORAL
  Filled 2011-12-08 (×9): qty 1

## 2011-12-08 MED FILL — Prednisone Tab 20 MG: ORAL | Qty: 3 | Status: AC

## 2011-12-08 MED FILL — Piperacillin Sod-Tazobactam Sod in Dex IV Sol 3-0.375GM/50ML: INTRAVENOUS | Qty: 50 | Status: AC

## 2011-12-08 MED FILL — Albuterol Sulfate Soln Nebu 0.083% (2.5 MG/3ML): RESPIRATORY_TRACT | Qty: 3 | Status: AC

## 2011-12-08 MED FILL — Acetaminophen Tab 325 MG: ORAL | Qty: 2 | Status: AC

## 2011-12-08 MED FILL — Vancomycin HCl For IV Soln 1 GM (Base Equivalent): INTRAVENOUS | Qty: 1000 | Status: AC

## 2011-12-08 NOTE — Progress Notes (Signed)
PHARMACIST - PHYSICIAN ORDER COMMUNICATION  CONCERNING: P&T Medication Policy on Herbal Medications  DESCRIPTION:  This patient's order for:  lethicin  has been noted.  This product(s) is classified as an "herbal" or natural product. Due to a lack of definitive safety studies or FDA approval, nonstandard manufacturing practices, plus the potential risk of unknown drug-drug interactions while on inpatient medications, the Pharmacy and Therapeutics Committee does not permit the use of "herbal" or natural products of this type within [facility].   ACTION TAKEN: The pharmacy department is unable to verify this order at this time and your patient has been informed of this safety policy. Please reevaluate patient's clinical condition at discharge and address if the herbal or natural product(s) should be resumed at that time.   Elson Clan, PHARMD 12/08/2011@4 :42 PM

## 2011-12-08 NOTE — Care Management Note (Signed)
    Page 1 of 1   12/16/2011     1:10:25 PM   CARE MANAGEMENT NOTE 12/16/2011  Patient:  [name],[name]   Account Number:  [id number]  Date Initiated:  12/08/2011  Documentation initiated by:  Sharrie Rothman  Subjective/Objective Assessment:   Pt admitted from [facility] with pneumonia. Pt will return to [facility] at discharge. Pt is currently active with [facility] PT.     Action/Plan:   CM will arrange resumption of HH at discharge. Speech therapy is consulted for swallow eval. CSW is aware of pts admission and will arrange discharge to facility when medically stable.   Anticipated DC Date:  12/11/2011   Anticipated DC Plan:  ASSISTED LIVING / REST HOME  In-house referral  Clinical Social Worker      DC Planning Services  CM consult      Choice offered to / List presented to:             Status of service:  Completed, signed off Medicare Important Message given?   (If response is "NO", the following Medicare IM given date fields will be blank) Date Medicare IM given:   Date Additional Medicare IM given:    Discharge Disposition:    Per UR Regulation:    If discussed at Long Length of Stay Meetings, dates discussed:   12/16/2011    Comments:  12/16/11 1309 Arlyss Queen, RN BSN CM Pt discharged to [facility] today. CSW will make discharge arrrangements.  12/08/11 1436 Arlyss Queen, RN BSN CM

## 2011-12-08 NOTE — Consult Note (Signed)
Consult requested by: Dr. [name] Consult requested for recurrent pneumonia:  HPI: This is a 78 year old who has been in and out of the hospital with COPD exacerbations and pneumonia. He developed increasing cough and congestion and then developed a fever to 102 and came to the emergency room and was found to have new infiltrates. He is being treated as a healthcare associated pneumonia which is appropriate. He has a history of COPD and asthma. He also has dementia. He has a history of CHF.  Past Medical History  Diagnosis Date  . COPD (chronic obstructive pulmonary disease)   . Shortness of breath   . Asthma   . DEMENTIA     short term  . Hypertension   . Arthritis   . Hypothyroidism   . CHF (congestive heart failure)   . BPH (benign prostatic hyperplasia)   . Hiatal hernia   . Neuropathy      No family history on file.   History   Social History  . Marital Status: Single    Spouse Name: N/A    Number of Children: N/A  . Years of Education: N/A   Social History Main Topics  . Smoking status: Never Smoker   . Smokeless tobacco: Not on file  . Alcohol Use: No  . Drug Use: No  . Sexually Active: No   Other Topics Concern  . Not on file   Social History Narrative  . No narrative on file     ROS: There have not been any episodes of aspiration that are known.    Objective: Vital signs in last 24 hours: Temp:  [97.4 F (36.3 C)-99.2 F (37.3 C)] 97.4 F (36.3 C) (07/22 0559) Pulse Rate:  [58-62] 62  (07/22 0559) Resp:  [18] 18  (07/22 0559) BP: (129-132)/(67-92) 129/67 mmHg (07/22 0559) SpO2:  [97 %-98 %] 97 % (07/22 0702) Weight change:  Last BM Date:  (pt unsure)  Intake/Output from previous day: 07/21 0701 - 07/22 0700 In: -  Out: 350 [Urine:350]  PHYSICAL EXAM He is awake and alert and mildly confused. Pupils are reactive. Extremities slightly dry. His neck is supple. His chest shows some rhonchi bilaterally. His heart is regular without gallop. His  abdomen is soft without masses. Extremity showed no edema. Central nervous system exam shows the confusion  Lab Results: Basic Metabolic Panel: No results found for this basename: NA:2,K:2,CL:2,CO2:2,GLUCOSE:2,BUN:2,CREATININE:2,CALCIUM:2,MG:2,PHOS:2 in the last 72 hours Liver Function Tests: No results found for this basename: AST:2,ALT:2,ALKPHOS:2,BILITOT:2,PROT:2,ALBUMIN:2 in the last 72 hours No results found for this basename: LIPASE:2,AMYLASE:2 in the last 72 hours No results found for this basename: AMMONIA:2 in the last 72 hours CBC: No results found for this basename: WBC:2,NEUTROABS:2,HGB:2,HCT:2,MCV:2,PLT:2 in the last 72 hours Cardiac Enzymes: No results found for this basename: CKTOTAL:3,CKMB:3,CKMBINDEX:3,TROPONINI:3 in the last 72 hours BNP: No results found for this basename: PROBNP:3 in the last 72 hours D-Dimer: No results found for this basename: DDIMER:2 in the last 72 hours CBG: No results found for this basename: GLUCAP:6 in the last 72 hours Hemoglobin A1C: No results found for this basename: HGBA1C in the last 72 hours Fasting Lipid Panel: No results found for this basename: CHOL,HDL,LDLCALC,TRIG,CHOLHDL,LDLDIRECT in the last 72 hours Thyroid Function Tests: No results found for this basename: TSH,T4TOTAL,FREET4,T3FREE,THYROIDAB in the last 72 hours Anemia Panel: No results found for this basename: VITAMINB12,FOLATE,FERRITIN,TIBC,IRON,RETICCTPCT in the last 72 hours Coagulation: No results found for this basename: LABPROT:2,INR:2 in the last 72 hours Urine Drug Screen: Drugs of Abuse  No  results found for this basename: labopia, cocainscrnur, labbenz, amphetmu, thcu, labbarb    Alcohol Level: No results found for this basename: ETH:2 in the last 72 hours Urinalysis: No results found for this basename: COLORURINE:2,APPERANCEUR:2,LABSPEC:2,PHURINE:2,GLUCOSEU:2,HGBUR:2,BILIRUBINUR:2,KETONESUR:2,PROTEINUR:2,UROBILINOGEN:2,NITRITE:2,LEUKOCYTESUR:2 in the last 72  hours Misc. Labs:   ABGS: No results found for this basename: PHART,PCO2,PO2ART,TCO2,HCO3 in the last 72 hours   MICROBIOLOGY: No results found for this or any previous visit (from the past 240 hour(s)).  Studies/Results: No results found.  Medications:  Prior to Admission:  Prescriptions prior to admission  Medication Sig Dispense Refill  . ALPRAZolam (XANAX) 0.25 MG tablet Take 0.375 mg by mouth at bedtime. 1 & one-half tablet taken at bedtime      . aspirin EC 81 MG tablet Take 81 mg by mouth daily.       . Calcium Carbonate-Vitamin D (CALCIUM 600/VITAMIN D PO) Take 1 tablet by mouth daily.      . digoxin (LANOXIN) 0.125 MG tablet Take 125 mcg by mouth daily.       Marland Kitchen diltiazem (TAZTIA XT) 120 MG 24 hr capsule Take 120 mg by mouth daily.       . fish oil-omega-3 fatty acids 1000 MG capsule Take 1 g by mouth daily.       . furosemide (LASIX) 40 MG tablet Take 40 mg by mouth daily.       Marland Kitchen gabapentin (NEURONTIN) 600 MG tablet Take 600 mg by mouth 4 (four) times daily.       . Ibuprofen-Diphenhydramine HCl (ADVIL PM) 200-25 MG CAPS Take 2 tablets by mouth at bedtime. For pain and sleep      . Lecithin 1200 MG CAPS Take 1,200 mg by mouth daily.      Marland Kitchen levothyroxine (SYNTHROID, LEVOTHROID) 50 MCG tablet Take 50 mcg by mouth daily.       Marland Kitchen loratadine (CLARITIN) 10 MG tablet Take 10 mg by mouth daily.      Marland Kitchen PARoxetine (PAXIL) 40 MG tablet Take 40 mg by mouth at bedtime.       . polyethylene glycol powder (GLYCOLAX/MIRALAX) powder Take 17 g by mouth daily. **MIX ONE CAPFUL IN 8 OUNCES OF WATER/JUICE THEN DRINK DAILY**      . potassium chloride (K-DUR) 10 MEQ tablet Take 10 mEq by mouth daily.       . quinapril (ACCUPRIL) 20 MG tablet Take 20 mg by mouth daily.       Marland Kitchen senna (SENOKOT) 8.6 MG TABS Take 3 tablets by mouth at bedtime.       . Tamsulosin HCl (FLOMAX) 0.4 MG CAPS Take 0.4 mg by mouth.      . vitamin C (ASCORBIC ACID) 500 MG tablet Take 1,000 mg by mouth daily.       .  vitamin E (VITAMIN E) 400 UNIT capsule Take 400 Units by mouth daily.        Scheduled:   . albuterol  2.5 mg Nebulization Q4H WA  . ipratropium  0.5 mg Nebulization Q4H WA  . levofloxacin (LEVAQUIN) IV  500 mg Intravenous Q24H    Assesment: He has recurrent pneumonia. I think he needs to be checked for aspirations on going to ask for speech consultation. I added Levaquin to the current antibiotics. Active Problems:  * No active hospital problems. *     Plan: He will continue treatments I will ask for speech consultation    LOS: 0 days   [name] L 12/08/2011, 8:59 AM

## 2011-12-08 NOTE — Evaluation (Signed)
Clinical/Bedside Swallow Evaluation Patient Details  Name: [name] MRN: [id number] Date of Birth: 12/14/1917  Today's Date: 12/08/2011 Time: 1530-1600 SLP Time Calculation (min): 30 min  Past Medical History:  Past Medical History  Diagnosis Date  . COPD (chronic obstructive pulmonary disease)   . Shortness of breath   . Asthma   . DEMENTIA     short term  . Hypertension   . Arthritis   . Hypothyroidism   . CHF (congestive heart failure)   . BPH (benign prostatic hyperplasia)   . Hiatal hernia   . Neuropathy    Past Surgical History:  Past Surgical History  Procedure Date  . Repair knee ligament    HPI:  78 yo resident at ALF admitted with COPD exacerbation and fever (102). Chest x-ray shows infiltrates. This is the pt's fourth hospital admission in 6 months.   Assessment / Plan / Recommendation Clinical Impression  Pt only had one episode of coughing which was delayed after sips water. His family reports that he frequently coughs and chokes during meals and worry that he may be aspirating. They say that he puts too much food in his mouth and talks at the same time. Will complete MBSS tomorrow and keep diet unchanged tonight.     Aspiration Risk  Mild    Diet Recommendation Dysphagia 3 (Mechanical Soft);Thin liquid   Liquid Administration via: Cup;Straw Medication Administration: Whole meds with liquid Supervision: Patient able to self feed (will need assist for feeding) Compensations: Slow rate;Small sips/bites Postural Changes and/or Swallow Maneuvers: Seated upright 90 degrees;Upright 30-60 min after meal    Other  Recommendations Oral Care Recommendations: Oral care BID Other Recommendations: Clarify dietary restrictions   Follow Up Recommendations  None    Frequency and Duration        Pertinent Vitals/Pain     SLP Swallow Goals  Pending results of MBSS tomorrow   Swallow Study Prior Functional Status       General Date of Onset:  12/07/11 HPI: 78 yo resident at ALF admitted with COPD exacerbation and fever (102). Chest x-ray shows infiltrates. This is the pt's fourth hospital admission in 6 months. Type of Study: Bedside swallow evaluation Previous Swallow Assessment: None on record Diet Prior to this Study: Regular;Thin liquids Temperature Spikes Noted: Yes Respiratory Status: Supplemental O2 delivered via (comment) History of Recent Intubation: No Behavior/Cognition: Alert;Cooperative;Hard of hearing Oral Cavity - Dentition: Adequate natural dentition (missing molars) Self-Feeding Abilities: Needs assist Patient Positioning: Upright in bed Baseline Vocal Quality: Clear Volitional Cough: Strong Volitional Swallow: Able to elicit    Oral/Motor/Sensory Function Overall Oral Motor/Sensory Function: Appears within functional limits for tasks assessed   Ice Chips Ice chips: Within functional limits Presentation: Spoon   Thin Liquid Thin Liquid: Impaired Presentation: Cup;Straw;Spoon Pharyngeal  Phase Impairments: Cough - Delayed (x1)    Nectar Thick Nectar Thick Liquid: Not tested   Honey Thick Honey Thick Liquid: Not tested   Puree Puree: Within functional limits Presentation: Spoon   Solid Solid: Within functional limits Presentation: Self Fed    [name] 12/08/2011,4:13 PM

## 2011-12-08 NOTE — Clinical Social Work Psychosocial (Signed)
    Clinical Social Work Department BRIEF PSYCHOSOCIAL ASSESSMENT 12/08/2011  Patient:  [name],[name]     Account Number:  [id number]     Admit date:  12/08/2011  Clinical Social Worker:  [name], CLINICAL SOCIAL WORKER  Date/Time:  12/08/2011 11:00 AM  Referred by:  CSW  Date Referred:  12/08/2011  Other Referral:   Interview type:  Family Other interview type:   Brief interview w patient    PSYCHOSOCIAL DATA Living Status:  FACILITY Admitted from facility:  [facility] Level of care:  Assisted Living Primary support name:  [name] Primary support relationship to patient:  CHILD, ADULT Degree of support available:   Significant support    CURRENT CONCERNS Current Concerns  Post-Acute Placement   Other Concerns:   Family dealing w Alzheimers disease progression    SOCIAL WORK ASSESSMENT / PLAN Met w patient at bedside, patient had difficulty communicating, was able to say that he lived at [facility], but did not know how long he had been there.  Spoke w son, who is POA for patient.  Patient had lived w son until 2 years ago when his behavior became too difficult to manage at home.  Son states that father has become more argumentative and behaviorally difficult, felt like he would benefit from being in facility.  Placed at [facility].  Family is willing for father to return, but concerned that patient has had 5 hospitalizations since January.  Concerned that patient may be aspirating, family saw him gagging on food and would like swallow evaluation. Spoke w facility who states that they are willing to take patient back when medically ready, give extensive assist w bathing, dressing and toileting.  Says patient ambulates w walker on a "good day", but often uses wheelchair.  Have not noticed any difficulty w gagging/swallowing and states that patient feeds himself.   Assessment/plan status:  Psychosocial Support/Ongoing Assessment of Needs Other  assessment/ plan:   Information/referral to community resources:   Pamphlet:  Caring for a Person w Alzheimers Disease    PATIENT'S/FAMILY'S RESPONSE TO PLAN OF CARE: Adult nurse.    [name] Clinical Social Worker [phone])

## 2011-12-08 NOTE — Progress Notes (Signed)
[name], [name]                ACCOUNT NO.:  [id number]  MEDICAL RECORD NO.:  [id number]  LOCATION:  A213                          FACILITY:  [facility]  PHYSICIAN:  Jervis Trapani D. [name], MD   DATE OF BIRTH:  [birth year]  DATE OF PROCEDURE:  12/08/2011 DATE OF DISCHARGE:                                PROGRESS NOTE   SUBJECTIVE:  The patient feels better today.  His fever is subsiding. He is coughing less.  No new complaints.  OBJECTIVE:  VITAL SIGNS:  Blood pressure 129/67, pulse 58, respiratory rate 18, temperature 97 degrees Fahrenheit. CHEST:  Decreased air entry, few rhonchi. CARDIOVASCULAR SYSTEM:  First and second heart sounds heard.  No murmur. No gallop. ABDOMEN:  Soft and lax.  Bowel sound is positive.  No mass or organomegaly. EXTREMITIES:  No leg edema.  ASSESSMENT: 1. Healthcare-associated pneumonia. 2. Chronic obstructive pulmonary disease. 3. Congestive heart failure. 4. Hypothyroidism. 5. Dementia.  PLAN:  Continue the patient on IV antibiotics.  Continue nebulizer treatment.  We will do pulmonary consult.     [name] D. [name], MD     TDF/MEDQ  D:  12/08/2011  T:  12/08/2011  Job:  [id number]

## 2011-12-08 NOTE — Consult Note (Signed)
ANTIBIOTIC CONSULT NOTE - INITIAL  Pharmacy Consult for Vancomycin Indication: rule out pneumonia  Allergies  Allergen Reactions  . Codeine   . Penicillins Other (See Comments)    Unknown reactions   . Sulfa Antibiotics Other (See Comments)    Unknown reaction    Patient Measurements:   Adjusted Body Weight: 82Kg (last recorded weight)  Vital Signs: Temp: 97.4 F (36.3 C) (07/22 0559) Temp src: Oral (07/22 0559) BP: 129/67 mmHg (07/22 0559) Pulse Rate: 62  (07/22 0559) Intake/Output from previous day: 07/21 0701 - 07/22 0700 In: -  Out: 350 [Urine:350] Intake/Output from this shift: Total I/O In: 120 [P.O.:120] Out: -   Labs:  Basename 12/08/11 0839  WBC --  HGB --  PLT --  LABCREA --  CREATININE 1.34   The CrCl is unknown because both a height and weight (above a minimum accepted value) are required for this calculation. No results found for this basename: VANCOTROUGH:2,VANCOPEAK:2,VANCORANDOM:2,GENTTROUGH:2,GENTPEAK:2,GENTRANDOM:2,TOBRATROUGH:2,TOBRAPEAK:2,TOBRARND:2,AMIKACINPEAK:2,AMIKACINTROU:2,AMIKACIN:2, in the last 72 hours   Microbiology: Recent Results (from the past 720 hour(s))  CULTURE, BLOOD (ROUTINE X 2)     Status: Normal   Collection Time   11/20/11  5:34 PM      Component Value Range Status Comment   Specimen Description BLOOD LEFT ANTECUBITAL   Final    Special Requests BOTTLES DRAWN AEROBIC AND ANAEROBIC 4CC   Final    Culture NO GROWTH 5 DAYS   Final    Report Status 11/25/2011 FINAL   Final   CULTURE, BLOOD (ROUTINE X 2)     Status: Normal   Collection Time   11/20/11  5:35 PM      Component Value Range Status Comment   Specimen Description BLOOD RIGHT ARM   Final    Special Requests BOTTLES DRAWN AEROBIC ONLY 4CC   Final    Culture NO GROWTH 5 DAYS   Final    Report Status 11/25/2011 FINAL   Final   URINE CULTURE     Status: Normal   Collection Time   11/20/11  7:10 PM      Component Value Range Status Comment   Specimen Description  URINE, CATHETERIZED   Final    Special Requests NONE   Final    Culture  Setup Time 11/21/2011 21:23   Final    Colony Count NO GROWTH   Final    Culture NO GROWTH   Final    Report Status 11/22/2011 FINAL   Final   MRSA PCR SCREENING     Status: Normal   Collection Time   11/20/11 10:49 PM      Component Value Range Status Comment   MRSA by PCR NEGATIVE  NEGATIVE Final   URINE CULTURE     Status: Normal   Collection Time   11/23/11  1:40 PM      Component Value Range Status Comment   Specimen Description URINE, CLEAN CATCH   Final    Special Requests NONE   Final    Culture  Setup Time 11/23/2011 19:39   Final    Colony Count 15,000 COLONIES/ML   Final    Culture     Final    Value: Multiple bacterial morphotypes present, none predominant. Suggest appropriate recollection if clinically indicated.   Report Status 11/25/2011 FINAL   Final    Medical History: Past Medical History  Diagnosis Date  . COPD (chronic obstructive pulmonary disease)   . Shortness of breath   . Asthma   . DEMENTIA  short term  . Hypertension   . Arthritis   . Hypothyroidism   . CHF (congestive heart failure)   . BPH (benign prostatic hyperplasia)   . Hiatal hernia   . Neuropathy    Medications:  Scheduled:    . albuterol  2.5 mg Nebulization Q4H WA  . ipratropium  0.5 mg Nebulization Q4H WA  . levofloxacin (LEVAQUIN) IV  500 mg Intravenous Q24H  . vancomycin  1,250 mg Intravenous Q24H   Assessment: 89yoM with recurrent pna.  Last recorded weight = 82Kg.  SCr OK, renal fxn OK for age.  Levaquin added to regimen per MD.  Goal of Therapy:  Vancomycin trough level 15-20 mcg/ml  Plan: Vancomycin 1250mg  iv q24hrs Check trough at steady state Monitor labs, renal fxn, and cultures per protocol  Valrie Hart A 12/08/2011,10:19 AM

## 2011-12-08 NOTE — H&P (Signed)
[name], [name]                ACCOUNT NO.:  [id number]  MEDICAL RECORD NO.:  [id number]  LOCATION:  A213                          FACILITY:  [facility]  PHYSICIAN:  Nakeshia Waldeck D. [name], MD   DATE OF BIRTH:  1917-08-16  DATE OF ADMISSION:  12/08/2011 DATE OF DISCHARGE:  LH                             HISTORY & PHYSICAL   CHIEF COMPLAINT:  Fever, cough, and congestion.  HISTORY OF PRESENT ILLNESS:  This is a 78 year old male patient with history of multiple medical illnesses, who was in and out of hospital, came with history of fever, cough, and congestion.  The patient was recently discharged after he was treated for fever.  He was on oral antibiotics and assisted living.  However, continued to have a fever of 102 degrees Fahrenheit.  He was coughing and congested.  His chest x-ray in the emergency room showed new infiltrates.  The patient was then started on combination of IV antibiotics and admitted as hospital- acquired pneumonia.  REVIEW OF SYSTEMS:  The patient was somewhat confused and disoriented. He is unable to give a detailed history.  However, he has generalized weakness and loss of appetite.  No chest pain, nausea, vomiting, abdominal pain, dysuria, urgency, or frequency of urination.  PAST MEDICAL HISTORY: 1. History of chronic obstructive pulmonary disease. 2. History of pneumonia. 3. Dementia. 4. Hypertension. 5. Hypothyroidism. 6. Congestive heart failure. 7. Benign prostatic hypertrophy. 8. Degenerative joint disease. 9. Neuropathy.  CURRENT MEDICATIONS: 1. Xanax 0.375 at bedtime. 2. Aspirin 81 mg daily. 3. Calcium with vitamin D 1 tablet daily. 4. Digoxin 0.125 mg daily. 5. Fish oil with omega-3 1000 mg daily. 6. Lasix 40 mg daily. 7. Gabapentin 600 mg daily. 8. Lecithin 1200 mg daily. 9. Levothyroxine 500 mcg daily. 10.Loratadine 10 mg daily. 11.Paxil 40 mg daily. 12.MiraLax 17 g daily. 13.KCl 10 mEq daily. 14.Accupril 20 mg daily. 15.Senna 3  tablets at bedtime. 16.Flomax 0.4 mg daily. 17.Diltiazem 120 mg daily. 18.Ascorbic acid 1000 mg daily. 19.Vitamin E 400 mg daily. 20.Advil PM 2 tablets at bedtime.  SOCIAL HISTORY:  The patient is currently a resident of [facility].  No history of alcohol, tobacco, or substance abuse.  FAMILY HISTORY:  Not available at this time.  PHYSICAL EXAMINATION:  GENERAL:  The patient is alert, awake, and chronically sick looking. VITAL SIGNS:  Blood pressure 132/92, pulse 62, respiratory rate 18, temperature 99 degrees Fahrenheit. HEENT:  Pupils are equal and reactive. NECK:  Supple. CHEST:  Decreased air entry, few rhonchi. CARDIOVASCULAR SYSTEM:  First and second heart sounds heard.  No murmur. No gallop. ABDOMEN:  Soft and lax.  Bowel sound is positive.  No mass or organomegaly. EXTREMITIES:  No leg edema.  ASSESSMENT: 1. Healthcare-associated pneumonia. 2. Chronic obstructive pulmonary disease. 3. Advanced dementia. 4. History of congestive heart failure. 5. Hypothyroidism. 6. Hypertension.  PLAN:  We will continue the patient on combination of IV antibiotics. We will do Pulmonary consult.  We will continue his regular medications.     Tameka Hoiland D. [name], MD     TDF/MEDQ  D:  12/08/2011  T:  12/08/2011  Job:  [id number]

## 2011-12-09 ENCOUNTER — Inpatient Hospital Stay (HOSPITAL_COMMUNITY): Payer: Medicare Other

## 2011-12-09 MED ORDER — SODIUM CHLORIDE 0.9 % IJ SOLN
3.0000 mL | INTRAMUSCULAR | Status: DC | PRN
  Administered 2011-12-12 – 2011-12-13 (×3): 3 mL via INTRAVENOUS
  Filled 2011-12-09 (×6): qty 3

## 2011-12-09 MED ORDER — IPRATROPIUM BROMIDE 0.02 % IN SOLN
0.5000 mg | Freq: Four times a day (QID) | RESPIRATORY_TRACT | Status: DC
  Administered 2011-12-09 – 2011-12-16 (×28): 0.5 mg via RESPIRATORY_TRACT
  Filled 2011-12-09 (×29): qty 2.5

## 2011-12-09 MED ORDER — ALBUTEROL SULFATE (5 MG/ML) 0.5% IN NEBU
2.5000 mg | INHALATION_SOLUTION | Freq: Four times a day (QID) | RESPIRATORY_TRACT | Status: DC
  Administered 2011-12-09 – 2011-12-16 (×28): 2.5 mg via RESPIRATORY_TRACT
  Filled 2011-12-09 (×29): qty 0.5

## 2011-12-09 MED ORDER — FUROSEMIDE 10 MG/ML IJ SOLN
40.0000 mg | Freq: Once | INTRAMUSCULAR | Status: AC
  Administered 2011-12-09: 40 mg via INTRAVENOUS
  Filled 2011-12-09: qty 4

## 2011-12-09 MED ORDER — RESOURCE THICKENUP CLEAR PO POWD
ORAL | Status: DC | PRN
  Filled 2011-12-09: qty 125

## 2011-12-09 MED ORDER — SODIUM CHLORIDE 0.9 % IJ SOLN
3.0000 mL | Freq: Two times a day (BID) | INTRAMUSCULAR | Status: DC
  Administered 2011-12-09 – 2011-12-16 (×15): 3 mL via INTRAVENOUS
  Filled 2011-12-09 (×14): qty 3

## 2011-12-09 MED ORDER — SODIUM CHLORIDE 0.9 % IV SOLN: 250.0000 mL | INTRAVENOUS | Status: DC | PRN

## 2011-12-09 NOTE — Progress Notes (Signed)
Subjective: I think he is overall about the same. Speech evaluation is underway. He has no new complaints.  Objective: Vital signs in last 24 hours: Temp:  [98.3 F (36.8 C)-98.6 F (37 C)] 98.6 F (37 C) (07/23 0533) Pulse Rate:  [63-66] 63  (07/23 0533) Resp:  [20] 20  (07/23 0533) BP: (102-118)/(54-66) 102/54 mmHg (07/23 0533) SpO2:  [93 %-98 %] 94 % (07/23 0533) Weight change:  Last BM Date: 12/05/11  Intake/Output from previous day: 07/22 0701 - 07/23 0700 In: 1205 [P.O.:480; I.V.:675; IV Piggyback:50] Out: 1050 [Urine:1050]  PHYSICAL EXAM General appearance: alert, no distress and uncooperative Resp: rhonchi bilaterally Cardio: S1, S2 normal GI: soft, non-tender; bowel sounds normal; no masses,  no organomegaly Extremities: extremities normal, atraumatic, no cyanosis or edema  Lab Results:    Basic Metabolic Panel:  Basename 12/08/11 0839 12/07/11 2013  NA 134* 134*  K 4.3 4.7  CL 95* 96  CO2 30 32  GLUCOSE 181* 111*  BUN 37* 38*  CREATININE 1.34 1.48*  CALCIUM 10.1 10.6*  MG -- --  PHOS -- --   Liver Function Tests:  [facility] 12/07/11 2013  AST 28  ALT 16  ALKPHOS 98  BILITOT 1.1  PROT 6.6  ALBUMIN 3.3*   No results found for this basename: LIPASE:2,AMYLASE:2 in the last 72 hours No results found for this basename: AMMONIA:2 in the last 72 hours CBC:  Basename 12/07/11 2013  WBC 10.3  NEUTROABS 7.9*  HGB 11.7*  HCT 35.1*  MCV 87.3  PLT 191   Cardiac Enzymes: No results found for this basename: CKTOTAL:3,CKMB:3,CKMBINDEX:3,TROPONINI:3 in the last 72 hours BNP: No results found for this basename: PROBNP:3 in the last 72 hours D-Dimer: No results found for this basename: DDIMER:2 in the last 72 hours CBG: No results found for this basename: GLUCAP:6 in the last 72 hours Hemoglobin A1C: No results found for this basename: HGBA1C in the last 72 hours Fasting Lipid Panel: No results found for this basename:  CHOL,HDL,LDLCALC,TRIG,CHOLHDL,LDLDIRECT in the last 72 hours Thyroid Function Tests: No results found for this basename: TSH,T4TOTAL,FREET4,T3FREE,THYROIDAB in the last 72 hours Anemia Panel: No results found for this basename: VITAMINB12,FOLATE,FERRITIN,TIBC,IRON,RETICCTPCT in the last 72 hours Coagulation:  Basename 12/07/11 2013  LABPROT 15.6*  INR 1.21   Urine Drug Screen: Drugs of Abuse  No results found for this basename: labopia, cocainscrnur, labbenz, amphetmu, thcu, labbarb    Alcohol Level: No results found for this basename: ETH:2 in the last 72 hours Urinalysis:  Basename 12/07/11 2013  COLORURINE YELLOW  LABSPEC 1.010  PHURINE 6.0  GLUCOSEU NEGATIVE  HGBUR MODERATE*  BILIRUBINUR NEGATIVE  KETONESUR NEGATIVE  PROTEINUR NEGATIVE  UROBILINOGEN 0.2  NITRITE NEGATIVE  LEUKOCYTESUR MODERATE*   Misc. Labs:  ABGS No results found for this basename: PHART,PCO2,PO2ART,TCO2,HCO3 in the last 72 hours CULTURES Recent Results (from the past 240 hour(s))  MRSA PCR SCREENING     Status: Normal   Collection Time   12/08/11  2:26 AM      Component Value Range Status Comment   MRSA by PCR NEGATIVE  NEGATIVE Final    Studies/Results: Dg Chest Port 1 View  12/09/2011  *RADIOLOGY REPORT*  Clinical Data: Shortness of breath.  Fever.  PORTABLE CHEST - 1 VIEW  Comparison: CXR 11/23/11.  Findings: Lung volumes are normal.  The irregular interstitial and airspace opacity in the right lower lobe, concerning for developing pneumonia or sequelae of aspiration.  Retrocardiac left lower lobe is poorly visualized, which could suggest atelectasis  or an additional area of airspace consolidation.  No definite pleural effusions.  Pulmonary vasculature is normal.  Moderate enlargement of the cardiopericardial silhouette which has a "water bottle" appearance, which could suggests cardiomegaly and/or the presence of a pericardial effusion. The patient is rotated to the right on today's exam,  resulting in distortion of the mediastinal contours and reduced diagnostic sensitivity and specificity for mediastinal pathology.  Atherosclerosis in the thoracic aorta. The aortic arch appears dilated, potentially 5 cm in diameter.  Old post-traumatic deformity of the anterolateral aspect of the right third and fourth ribs is noted.  IMPRESSION: 1.  Interstitial air space disease in the right lower lobe, concerning for pneumonia or sequela of aspiration. 2.  Atelectasis and/or consolidation in the left lower lobe as well. 3.  Moderate enlargement of the cardiopericardial silhouette which could to be secondary to underlying cardiomegaly and/or the presence of a pericardial effusion. 4.  Atherosclerosis.  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  This could be better evaluated with a contrast enhanced CT scan if clinically indicated.  Original Report Authenticated By: Florencia Reasons, M.D.    Medications:  Scheduled:   . albuterol  2.5 mg Nebulization QID  . ALPRAZolam  0.375 mg Oral QHS  . aspirin EC  81 mg Oral Daily  . calcium-vitamin D  1 tablet Oral Daily  . cefTRIAXone (ROCEPHIN)  IV  1 g Intravenous Q24H  . digoxin  125 mcg Oral Daily  . diltiazem  120 mg Oral Daily  . enoxaparin (LOVENOX) injection  40 mg Subcutaneous Q24H  . furosemide  40 mg Intravenous Once  . furosemide  40 mg Oral Daily  . gabapentin  600 mg Oral QID  . ipratropium  0.5 mg Nebulization QID  . levofloxacin (LEVAQUIN) IV  500 mg Intravenous Q24H  . levothyroxine  50 mcg Oral QAC breakfast  . lisinopril  20 mg Oral Daily  . omega-3 acid ethyl esters  1 g Oral Daily  . PARoxetine  40 mg Oral Daily  . polyethylene glycol  17 g Oral Daily  . potassium chloride  10 mEq Oral Daily  . senna  3 tablet Oral QHS  . Tamsulosin HCl  0.4 mg Oral QHS  . vancomycin  1,250 mg Intravenous Q24H  . vitamin C  500 mg Oral Daily  . vitamin E  400 Units Oral Daily   . DISCONTD: albuterol  2.5 mg Nebulization Q4H WA  . DISCONTD: fish oil-omega-3 fatty acids  1 g Oral Daily  . DISCONTD: ipratropium  0.5 mg Nebulization Q4H WA   Continuous:   . DISCONTD: sodium chloride 50 mL/hr at 12/09/11 0600   PRN:  Assesment: He has healthcare associated pneumonia. I think he may be aspirating. He has pretty severe COPD. He is improving slowly Active Problems:  * No active hospital problems. *     Plan: He is to finish his evaluation for speech therapy today. Overall I think he has improved. I don't think we need to change any of his treatments at this point.    LOS: 1 day   Rolla Kedzierski L 12/09/2011, 8:49 AM

## 2011-12-09 NOTE — Progress Notes (Signed)
Subjective:  Patient claims he had a rough night. He is coughing congested and wheezing. No fever or chills.   Physical Exam: Blood pressure 102/54, pulse 63, temperature 98.6 F (37 C), temperature source Axillary, resp. rate 20, SpO2 94.00%. Chest-- poor air entry,  Bilateral expiratory wheezes CVS-- s1 & S2 heard, no murmur  Investigations:  Recent Results (from the past 240 hour(s))  MRSA PCR SCREENING     Status: Normal   Collection Time   12/08/11  2:26 AM      Component Value Range Status Comment   MRSA by PCR NEGATIVE  NEGATIVE Final      Basic Metabolic Panel:  Basename 12/08/11 0839 12/07/11 2013  NA 134* 134*  K 4.3 4.7  CL 95* 96  CO2 30 32  GLUCOSE 181* 111*  BUN 37*  38*  CREATININE 1.34 1.48*  CALCIUM 10.1 10.6*  MG -- --  PHOS -- --   Liver Function Tests:  [facility] 12/07/11 2013  AST 28  ALT 16  ALKPHOS 98  BILITOT 1.1  PROT 6.6  ALBUMIN 3.3*     CBC:  Basename 12/07/11 2013  WBC 10.3  NEUTROABS 7.9*  HGB 11.7*  HCT 35.1*  MCV 87.3  PLT 191     No results found.    Medications: I have reviewed the patient's current medications.  Impression: 1. Health care associated Pneumonia 2. COPD 3. R/o fluid overload 4. H/O CHF 5. Dementia Active Problems:  * No active hospital problems. *      Plan: We will continue IV antibiotics and neb treatment Pulmonary consult appreciated We will D/C IV fluid We will give a dose IV lasix     LOS: 1 day   [facility] Pager [phone]  12/09/2011, 7:46 AM

## 2011-12-09 NOTE — Progress Notes (Signed)
UR Chart Review Completed  

## 2011-12-09 NOTE — Procedures (Signed)
Objective Swallowing Evaluation: Modified Barium Swallowing Study  Patient Details  Name: [name] MRN: [id number] Date of Birth: 1917-11-30  Today's Date: 12/09/2011 Time: 1191-4782 SLP Time Calculation (min): 39 min  Past Medical History:  Past Medical History  Diagnosis Date  . COPD (chronic obstructive pulmonary disease)   . Shortness of breath   . Asthma   . DEMENTIA     short term  . Hypertension   . Arthritis   . Hypothyroidism   . CHF (congestive heart failure)   . BPH (benign prostatic hyperplasia)   . Hiatal hernia   . Neuropathy    Past Surgical History:  Past Surgical History  Procedure Date  . Repair knee ligament    HPI:  78 yo resident at ALF admitted with COPD exacerbation and fever (102). Chest x-ray shows infiltrates. This is the pt's fourth hospital admission in 6 months.    Assessment / Plan / Recommendation Clinical Impression  Dysphagia Diagnosis: Mild oral phase dysphagia;Moderate pharyngeal phase dysphagia Clinical impression: Overall mild/moderate oropharyngeal phase dysphagia characterized by prolonged and inefficient oral prep with solids, delay in A-P transit, premature spillage with delay in swallow initiation (several seconds at times in pyriforms), decreased tongue base retraction, decreased laryngeal vestibule closure and overall decreased pharyngeal pressure resulting in trace silent aspiration of thin liquids and significant vallecular residue with solids necessitating repeat/dry swallows to clear. Recommend: Dysphagia 3 (mech soft) and nectar thick liquids by cup sip. Pt will need cues to clear throat and repeat swallow periodically. Crush larger pills as able in puree. Encourage pt to do several dry swallows at the conclusion of meals. Pt may benefit from dysphagia therapy and diet tolerance management with home health SLP.    Treatment Recommendation   Pharyngeal strengthening, compensatory strategies, diet tolerance, pt/family education      Diet Recommendation Dysphagia 3 (Mechanical Soft);Nectar-thick liquid   Liquid Administration via: Cup Medication Administration: Crushed with puree Supervision: Full supervision/cueing for compensatory strategies Compensations: Slow rate;Small sips/bites;Check for pocketing;Multiple dry swallows after each bite/sip;Follow solids with liquid;Clear throat intermittently Postural Changes and/or Swallow Maneuvers: Out of bed for meals;Seated upright 90 degrees;Upright 30-60 min after meal    Other  Recommendations Oral Care Recommendations: Oral care BID;Staff/trained caregiver to provide oral care Other Recommendations: Order thickener from pharmacy;Clarify dietary restrictions   Follow Up Recommendations  Home health SLP ([facility] prefers [facility])    Frequency and Duration min 2x/week  1 week   Pertinent Vitals/Pain     SLP Swallow Goals Patient will consume recommended diet without observed clinical signs of aspiration with: Moderate assistance Patient will utilize recommended strategies during swallow to increase swallowing safety with: Moderate assistance   General Date of Onset: 12/07/11 HPI: 78 yo resident at ALF admitted with COPD exacerbation and fever (102). Chest x-ray shows infiltrates. This is the pt's fourth hospital admission in 6 months. Type of Study: Modified Barium Swallowing Study Reason for Referral: Objectively evaluate swallowing function Previous Swallow Assessment: None on record Diet Prior to this Study: Dysphagia 3 (soft);Thin liquids Temperature Spikes Noted: Yes Respiratory Status: Supplemental O2 delivered via (comment) (nasal canula) History of Recent Intubation: No Behavior/Cognition: Alert;Cooperative;Hard of hearing Oral Cavity - Dentition: Adequate natural dentition (missing molars) Oral Motor / Sensory Function: Within functional limits Self-Feeding Abilities: Needs assist (has tremor) Patient Positioning: Upright in chair Baseline Vocal  Quality: Clear Volitional Cough: Strong Volitional Swallow: Able to elicit Anatomy: Within functional limits Pharyngeal Secretions: Not observed secondary MBS    Reason  for Referral Objectively evaluate swallowing function   Oral Phase  Impaired: solids   Pharyngeal Phase Pharyngeal Phase: Impaired   Cervical Esophageal Phase Cervical Esophageal Phase: [facility]   Thank you,  [name], CCC-SLP [phone]  [name] 12/09/2011, 1:50 PM

## 2011-12-10 LAB — BASIC METABOLIC PANEL
BUN: 41 mg/dL — ABNORMAL HIGH (ref 6–23)
GFR calc Af Amer: 45 mL/min — ABNORMAL LOW (ref >90)
GFR calc non Af Amer: 39 mL/min — ABNORMAL LOW (ref >90)
Potassium: 4.5 mEq/L (ref 3.5–5.1)

## 2011-12-10 NOTE — Consult Note (Signed)
ANTIBIOTIC CONSULT NOTE   Pharmacy Consult for Vancomycin Indication: rule out pneumonia  Allergies  Allergen Reactions  . Codeine     Reaction:unknown  . Penicillins Other (See Comments)    Unknown reactions   . Sulfa Antibiotics Other (See Comments)    Unknown reaction   Patient Measurements: Height: 5\' 9"  (175.3 cm) Weight: 184 lb 4.9 oz (83.6 kg) IBW/kg (Calculated) : 70.7  Adjusted Body Weight: 82Kg (last recorded weight)  Vital Signs: Temp: 98.4 F (36.9 C) (07/24 0601) Temp src: Oral (07/24 0601) BP: 118/70 mmHg (07/24 0601) Pulse Rate: 56  (07/24 0601) Intake/Output from previous day: 07/23 0701 - 07/24 0700 In: 1204 [P.O.:1200; IV Piggyback:4] Out: 200 [Urine:200] Intake/Output from this shift: Total I/O In: 240 [P.O.:240] Out: -   Labs:  Basename 12/10/11 0539 12/08/11 0839 12/07/11 2013  WBC -- -- 10.3  HGB -- -- 11.7*  PLT -- -- 191  LABCREA -- -- --  CREATININE 1.49* 1.34 1.48*   Estimated Creatinine Clearance: 32.3 ml/min (by C-G formula based on Cr of 1.49). No results found for this basename: VANCOTROUGH:2,VANCOPEAK:2,VANCORANDOM:2,GENTTROUGH:2,GENTPEAK:2,GENTRANDOM:2,TOBRATROUGH:2,TOBRAPEAK:2,TOBRARND:2,AMIKACINPEAK:2,AMIKACINTROU:2,AMIKACIN:2, in the last 72 hours   Microbiology: Recent Results (from the past 720 hour(s))  CULTURE, BLOOD (ROUTINE X 2)     Status: Normal   Collection Time   11/20/11  5:34 PM      Component Value Range Status Comment   Specimen Description BLOOD LEFT ANTECUBITAL   Final    Special Requests BOTTLES DRAWN AEROBIC AND ANAEROBIC 4CC   Final    Culture NO GROWTH 5 DAYS   Final    Report Status 11/25/2011 FINAL   Final   CULTURE, BLOOD (ROUTINE X 2)     Status: Normal   Collection Time   11/20/11  5:35 PM      Component Value Range Status Comment   Specimen Description BLOOD RIGHT ARM   Final    Special Requests BOTTLES DRAWN AEROBIC ONLY 4CC   Final    Culture NO GROWTH 5 DAYS   Final    Report Status 11/25/2011  FINAL   Final   URINE CULTURE     Status: Normal   Collection Time   11/20/11  7:10 PM      Component Value Range Status Comment   Specimen Description URINE, CATHETERIZED   Final    Special Requests NONE   Final    Culture  Setup Time 11/21/2011 21:23   Final    Colony Count NO GROWTH   Final    Culture NO GROWTH   Final    Report Status 11/22/2011 FINAL   Final   MRSA PCR SCREENING     Status: Normal   Collection Time   11/20/11 10:49 PM      Component Value Range Status Comment   MRSA by PCR NEGATIVE  NEGATIVE Final   URINE CULTURE     Status: Normal   Collection Time   11/23/11  1:40 PM      Component Value Range Status Comment   Specimen Description URINE, CLEAN CATCH   Final    Special Requests NONE   Final    Culture  Setup Time 11/23/2011 19:39   Final    Colony Count 15,000 COLONIES/ML   Final    Culture     Final    Value: Multiple bacterial morphotypes present, none predominant. Suggest appropriate recollection if clinically indicated.   Report Status 11/25/2011 FINAL   Final   URINE CULTURE  Status: Normal (Preliminary result)   Collection Time   12/07/11  7:18 PM      Component Value Range Status Comment   Specimen Description URINE, CLEAN CATCH   Final    Special Requests NONE   Final    Culture  Setup Time 12/08/2011 11:15   Final    Colony Count >=100,000 COLONIES/ML   Final    Culture PROTEUS MIRABILIS   Final    Report Status PENDING   Incomplete   CULTURE, BLOOD (ROUTINE X 2)     Status: Normal (Preliminary result)   Collection Time   12/07/11  8:27 PM      Component Value Range Status Comment   Specimen Description BLOOD RIGHT ANTECUBITAL   Final    Special Requests BOTTLES DRAWN AEROBIC AND ANAEROBIC 8CC   Final    Culture NO GROWTH 2 DAYS   Final    Report Status PENDING   Incomplete   CULTURE, BLOOD (ROUTINE X 2)     Status: Normal (Preliminary result)   Collection Time   12/07/11  8:45 PM      Component Value Range Status Comment   Specimen  Description BLOOD RIGHT HAND   Final    Special Requests BOTTLES DRAWN AEROBIC ONLY 7CC   Final    Culture NO GROWTH 2 DAYS   Final    Report Status PENDING   Incomplete   MRSA PCR SCREENING     Status: Normal   Collection Time   12/08/11  2:26 AM      Component Value Range Status Comment   MRSA by PCR NEGATIVE  NEGATIVE Final    Medical History: Past Medical History  Diagnosis Date  . COPD (chronic obstructive pulmonary disease)   . Shortness of breath   . Asthma   . DEMENTIA     short term  . Hypertension   . Arthritis   . Hypothyroidism   . CHF (congestive heart failure)   . BPH (benign prostatic hyperplasia)   . Hiatal hernia   . Neuropathy    Medications:  Scheduled:     . albuterol  2.5 mg Nebulization QID  . ALPRAZolam  0.375 mg Oral QHS  . aspirin EC  81 mg Oral Daily  . calcium-vitamin D  1 tablet Oral Daily  . cefTRIAXone (ROCEPHIN)  IV  1 g Intravenous Q24H  . digoxin  125 mcg Oral Daily  . diltiazem  120 mg Oral Daily  . enoxaparin (LOVENOX) injection  40 mg Subcutaneous Q24H  . furosemide  40 mg Oral Daily  . gabapentin  600 mg Oral QID  . ipratropium  0.5 mg Nebulization QID  . levofloxacin (LEVAQUIN) IV  500 mg Intravenous Q24H  . levothyroxine  50 mcg Oral QAC breakfast  . lisinopril  20 mg Oral Daily  . omega-3 acid ethyl esters  1 g Oral Daily  . PARoxetine  40 mg Oral Daily  . polyethylene glycol  17 g Oral Daily  . potassium chloride  10 mEq Oral Daily  . senna  3 tablet Oral QHS  . sodium chloride  3 mL Intravenous Q12H  . Tamsulosin HCl  0.4 mg Oral QHS  . vancomycin  1,250 mg Intravenous Q24H  . vitamin C  500 mg Oral Daily  . vitamin E  400 Units Oral Daily   Assessment: 89yoM with recurrent pna.  Last recorded weight = 82Kg.  SCr OK, renal fxn OK for age.  Levaquin added to regimen per  MD.  Goal of Therapy:  Vancomycin trough level 15-20 mcg/ml  Plan: Vancomycin 1250mg  iv q24hrs Check trough tomorrow Monitor labs, renal fxn, and  cultures per protocol  Margo Aye, Geraldine Tesar A 12/10/2011,10:28 AM

## 2011-12-10 NOTE — Clinical Documentation Improvement (Signed)
PNEUMONIA DOCUMENTATION CLARIFICATION QUERY  THIS DOCUMENT IS NOT A PERMANENT PART OF THE MEDICAL RECORD  TO RESPOND TO THE THIS QUERY, FOLLOW THE INSTRUCTIONS BELOW:  1. If needed, update documentation for the patient's encounter via the notes activity.  2. Access this query again and click edit on the [employer].  3. After updating, or not, click F2 to complete all highlighted (required) fields concerning your review. Select "additional documentation in the medical record" OR "no additional documentation provided".  4. Click Sign note button.  5. The deficiency will fall out of your InBasket *Please let us know if you are not able to complete this workflow by phone or e-mail (listed below).  Please update your documentation within the medical record to reflect your response to this query.                                                                                    12/10/11  Dear Dr. Juanetta Gosling / Associates  In a better effort to capture your patient's severity of illness, reflect appropriate length of stay and utilization of resources, a review of the patient medical record has revealed the following indicators.  Based on your clinical judgment, please clarify and document in a progress note and/or discharge summary the clinical condition associated with the following supporting information. In responding to this query please exercise your independent judgment.  The fact that a query is asked, does not imply that any particular answer is desired or expected.  Possible Clinical Conditions?   Aspiration Pneumonia (POA?) HCAP  Other Condition____________________ Cannot Clinically Determine   Clinical Information:   Risk Factors:  HCAP Dysphagia "may be aspirating" Hx COPD & Advanced dementia  Signs & Symptoms coughing fever  congestion  Diagnostics: -Lab: WBC 7/5=7.9 7/21=10.3  -Radiology CXR IMPRESSION: Interstitial air space disease in the right lower  lobe, concerning for pneumonia or sequela of aspiration  Modified Barium Swallow: Clinical impression: Overall mild/moderate oropharyngeal phase dysphagia characterized by prolonged and inefficient oral prep with solids, delay in A-P transit, premature spillage with delay in swallow initiation (several seconds at times in pyriforms), decreased tongue base retraction, decreased laryngeal vestibule closure and overall decreased pharyngeal pressure resulting in trace silent aspiration of thin liquids and significant vallecular residue with solids necessitating repeat/dry swallows to clear  -Swallow eval:Pt only had one episode of coughing which was delayed after sips water. His family reports that he frequently coughs and chokes during meals and worry that he may be aspirating.  Microbiology: -Blood C&S: pending  Treatments: -Current ABX: IV Rocephin 1g q24h; IV Levaquin 500mg  q24h; IV Vancomycin 1250mg  q24h -O2 Mode Oxygen therapy to keep Sats >90% -Respiratory Treatments: Atrovent 0.5mg  nebs qid; Proventil 2.5mg  nebs qid  Diet Recommendation Dysphagia 3 (Mechanical Soft);Nectar-thick liquid; Crush meds   You may use possible, probable, or suspect with inpatient documentation. possible, probable, suspected diagnoses MUST be documented at the time of discharge  Reviewed: additional documentation in the medical record  Thank You,  Debora T Williams RN, MSN Clinical Documentation Specialist: Office# [phone] [facility] Health Information Management [place]

## 2011-12-10 NOTE — Evaluation (Signed)
Physical Therapy Evaluation Patient Details Name: [name] MRN: [id number] DOB: 13-Feb-1918 Today's Date: 12/10/2011 Time: 4098-1191 PT Time Calculation (min): 39 min  PT Assessment / Plan / Recommendation Clinical Impression  Pt's functional status is not significantly changed from his eval on 11-24-11.  He reports being "afraid of falling", which is decreasing his ambulatory distance to some degree.  His primary mobility is via a w/c at [facility].  We need to have nursing service be more proactive in getting him OOB daily.  We will keep him on our service on a very limited basis and can add HHPT if he would like.    PT Assessment  Patient needs continued PT services    Follow Up Recommendations  Home health PT (if desired by pt/family)    Barriers to Discharge None      Equipment Recommendations  None recommended by PT    Recommendations for Other Services     Frequency Min 2X/week    Precautions / Restrictions Precautions Precautions: Fall Restrictions Weight Bearing Restrictions: No   Pertinent Vitals/Pain       Mobility  Bed Mobility Supine to Sit: HOB flat;4: Min assist Sit to Supine: Not Tested (comment) Transfers Sit to Stand: 3: Mod assist;With upper extremity assist;From bed;From chair/3-in-1 Stand to Sit: With upper extremity assist;4: Min assist;To chair/3-in-1 Ambulation/Gait Ambulation/Gait Assistance: 5: Supervision Ambulation Distance (Feet): 8 Feet Assistive device: Rolling walker Gait Pattern: Step-through pattern;Trunk flexed;Left flexed knee in stance;Right flexed knee in stance General Gait Details: Pt is able to stand more erect when verbally cued Stairs: No Wheelchair Mobility Wheelchair Mobility: No    Exercises General Exercises - Lower Extremity Ankle Circles/Pumps: AROM;Both;5 reps;Supine Heel Slides: AROM;Both;Supine;10 reps Hip ABduction/ADduction: AROM;Both;10 reps;Supine Straight Leg Raises: AROM;Both;10 reps;Supine   PT Diagnosis:  Difficulty walking;Generalized weakness  PT Problem List: Decreased strength;Decreased activity tolerance;Decreased mobility PT Treatment Interventions: Gait training;Therapeutic exercise;Patient/family education   PT Goals Acute Rehab PT Goals PT Goal Formulation: With patient Potential to Achieve Goals: Good Pt will go Supine/Side to Sit: with supervision;with HOB 0 degrees PT Goal: Supine/Side to Sit - Progress: Goal set today Pt will go Sit to Supine/Side: with supervision;with HOB 0 degrees PT Goal: Sit to Supine/Side - Progress: Goal set today Pt will go Sit to Stand: with min assist;with upper extremity assist PT Goal: Sit to Stand - Progress: Goal set today Pt will go Stand to Sit: with supervision;with upper extremity assist PT Goal: Stand to Sit - Progress: Goal set today Pt will Ambulate: 16 - 50 feet;with supervision;with rolling walker PT Goal: Ambulate - Progress: Goal set today  Visit Information  Last PT Received On: 12/10/11    Subjective Data  Subjective: I haven't been out of bed since Sunday Patient Stated Goal: none stated   Prior Functioning  Home Living Available Help at Discharge: Personal care attendant Type of Home: Assisted living Home Access: Level entry Prior Function Driving: No    Cognition  Overall Cognitive Status: History of cognitive impairments - at baseline Orientation Level: Appears intact for tasks assessed Behavior During Session: [facility] for tasks performed    Extremity/Trunk Assessment Right Upper Extremity Assessment RUE ROM/Strength/Tone: [facility] for tasks assessed Left Upper Extremity Assessment LUE ROM/Strength/Tone: [facility] for tasks assessed Right Lower Extremity Assessment RLE ROM/Strength/Tone: Deficits RLE ROM/Strength/Tone Deficits: generally 3/5 RLE Sensation: WFL - Light Touch RLE Coordination: WFL - gross motor Left Lower Extremity Assessment LLE ROM/Strength/Tone Deficits: generally 3/5 LLE Sensation: WFL - Light Touch;WFL  - Proprioception LLE Coordination:  WFL - gross motor Trunk Assessment Trunk Assessment: Kyphotic   Balance Balance Balance Assessed: No  End of Session PT - End of Session Equipment Utilized During Treatment: Gait belt Activity Tolerance: Patient tolerated treatment well Patient left: in chair;with call bell/phone within reach  GP     Myrlene Broker L 12/10/2011, 9:24 AM

## 2011-12-10 NOTE — Progress Notes (Signed)
I think he is overall improving. He has pneumonia which I think is likely secondary to aspiration. He has COPD as well. He has pretty severe dementia and a history of CHF.he says he feels better and his chest is clearing  His temperature is 98.1, pulse 61 respirations 20 and blood pressure 108/56. His oxygen saturations 100%. His laboratory work shows BUN 41 creatinine 1.49 his chest is clearer than before. He still has some rhonchi  Assess as he has healthcare associated pneumonia probably related to aspiration. He is improving. He has dementia as well. He has CHF which I think is about the same  Continue his IV antibiotics for now.

## 2011-12-10 NOTE — Progress Notes (Signed)
[name], [name]                ACCOUNT NO.:  [id number]  MEDICAL RECORD NO.:  [id number]  LOCATION:                                 FACILITY:  PHYSICIAN:  Malikai Gut D. [name], MD   DATE OF BIRTH:  June 13, 1917  DATE OF PROCEDURE:  12/10/2011 DATE OF DISCHARGE:                                PROGRESS NOTE   SUBJECTIVE:  The patient feels better today.  His cough and congestion is improving.  No fever or chills.  OBJECTIVE:  VITALS:  Blood pressure 132/84, pulse 64, respiratory rate 20, temperature 98.6 degrees Fahrenheit. CHEST:  Decreased air entry, bilateral rhonchi. CARDIOVASCULAR SYSTEM:  First and second heart sounds heard.  No murmur. No gallop. ABDOMEN:  Soft and lax.  Bowel sound is positive.  No mass or organomegaly. EXTREMITIES:  No leg edema.  LABORATORY DATA:  Sodium 138, potassium 4.5, chloride 99, carbon dioxide 31, glucose 101, BUN 41, creatinine 1.19, calcium 9.9.  ASSESSMENT: 1. Healthcare-associated pneumonia. 2. Chronic obstructive pulmonary disease. 3. History of congestive heart failure. 4. Dementia. 5. Hypothyroidism.  PLAN:  We will continue the patient on IV antibiotics.  We will continue nebulizer treatment.  Continue oral diuretics.     Evy Lutterman D. [name], MD     TDF/MEDQ  D:  12/10/2011  T:  12/10/2011  Job:  [id number]

## 2011-12-11 ENCOUNTER — Encounter (HOSPITAL_COMMUNITY): Payer: Self-pay | Admitting: General Practice

## 2011-12-11 LAB — URINE CULTURE: Colony Count: >=100000

## 2011-12-11 LAB — VANCOMYCIN, TROUGH: Vancomycin Tr: 21.4 ug/mL — ABNORMAL HIGH (ref 10.0–20.0)

## 2011-12-11 MED ORDER — ACETAMINOPHEN 325 MG PO TABS
650.0000 mg | ORAL_TABLET | ORAL | Status: DC | PRN
  Administered 2011-12-11 – 2011-12-14 (×4): 650 mg via ORAL
  Filled 2011-12-11 (×4): qty 2

## 2011-12-11 MED ORDER — VANCOMYCIN HCL IN DEXTROSE 1-5 GM/200ML-% IV SOLN
1000.0000 mg | INTRAVENOUS | Status: DC
  Administered 2011-12-11 – 2011-12-14 (×4): 1000 mg via INTRAVENOUS
  Filled 2011-12-11 (×4): qty 200

## 2011-12-11 MED ORDER — LEVOFLOXACIN 500 MG PO TABS
500.0000 mg | ORAL_TABLET | Freq: Every day | ORAL | Status: DC
  Administered 2011-12-12 – 2011-12-16 (×5): 500 mg via ORAL
  Filled 2011-12-11 (×5): qty 1

## 2011-12-11 MED ORDER — VANCOMYCIN HCL IN DEXTROSE 1-5 GM/200ML-% IV SOLN
INTRAVENOUS | Status: AC
  Filled 2011-12-11: qty 200

## 2011-12-11 MED ORDER — SODIUM CHLORIDE 0.9 % IJ SOLN
INTRAMUSCULAR | Status: AC
  Administered 2011-12-11: 10 mL
  Filled 2011-12-11: qty 3

## 2011-12-11 NOTE — Progress Notes (Signed)
PHARMACIST - PHYSICIAN COMMUNICATION DR:   Hawkins CONCERNING: Antibiotic IV to Oral Route Change Policy  RECOMMENDATION: This patient is receiving Levaquin by the intravenous route.  Based on criteria approved by the Pharmacy and Therapeutics Committee, the antibiotic(s) is/are being converted to the equivalent oral dose form(s).  DESCRIPTION: These criteria include:  Patient being treated for a respiratory tract infection, urinary tract infection, or cellulitis  The patient is not neutropenic and does not exhibit a GI malabsorption state  The patient is eating (either orally or via tube) and/or has been taking other orally administered medications for a least 24 hours  The patient is improving clinically and has a Tmax < 100.5  If you have questions about this conversion, please contact the Pharmacy Department  [x]  ( [phone] )  [facility] []  ( [phone]  [facility]  []  ( [phone] )  [facility] []  ( [phone] )  [facility]   S. Afton Mikelson, PharmD  

## 2011-12-11 NOTE — Clinical Social Work Note (Signed)
CSW met w patient at bedside.  Per patient, Lupita Leash from [facility] assessed patient yesterday evening and informed patient and son that facility could not accept patient back at discharge. Called and spoke w Lupita Leash at facility who stated their policy is not accept to patients on thickened liquids.  Facility states they have informed son of this and son wants [facility] or [facility].  Per PT, patient does not have a skilled need.  Spoke w [facility], who has a single room in [place] facility and a companion room at [facility].  Attempted to reach son by phone to convey information, but did not reach him.  Clovis Cao Clinical Social Worker [phone])

## 2011-12-11 NOTE — Consult Note (Signed)
ANTIBIOTIC CONSULT NOTE   Pharmacy Consult for Vancomycin Indication: rule out pneumonia  Allergies  Allergen Reactions  . Codeine     Reaction:unknown  . Penicillins Other (See Comments)    Unknown reactions   . Sulfa Antibiotics Other (See Comments)    Unknown reaction   Patient Measurements: Height: 5\' 9"  (175.3 cm) Weight: 184 lb 4.9 oz (83.6 kg) IBW/kg (Calculated) : 70.7  Adjusted Body Weight: 82Kg (last recorded weight)  Vital Signs: Temp: 98.7 F (37.1 C) (07/25 1348) Temp src: Oral (07/25 0549) BP: 105/63 mmHg (07/25 1348) Pulse Rate: 61  (07/25 1348) Intake/Output from previous day: 07/24 0701 - 07/25 0700 In: 480 [P.O.:480] Out: -  Intake/Output from this shift: Total I/O In: 730 [P.O.:330; IV Piggyback:400] Out: -   Labs:  Basename 12/10/11 0539  WBC --  HGB --  PLT --  LABCREA --  CREATININE 1.49*   Estimated Creatinine Clearance: 32.3 ml/min (by C-G formula based on Cr of 1.49).  Basename 12/11/11 1054  VANCOTROUGH 21.4*  VANCOPEAK --  Drue Dun --  GENTTROUGH --  GENTPEAK --  GENTRANDOM --  TOBRATROUGH --  TOBRAPEAK --  TOBRARND --  AMIKACINPEAK --  AMIKACINTROU --  AMIKACIN --     Microbiology: Recent Results (from the past 720 hour(s))  CULTURE, BLOOD (ROUTINE X 2)     Status: Normal   Collection Time   11/20/11  5:34 PM      Component Value Range Status Comment   Specimen Description BLOOD LEFT ANTECUBITAL   Final    Special Requests BOTTLES DRAWN AEROBIC AND ANAEROBIC 4CC   Final    Culture NO GROWTH 5 DAYS   Final    Report Status 11/25/2011 FINAL   Final   CULTURE, BLOOD (ROUTINE X 2)     Status: Normal   Collection Time   11/20/11  5:35 PM      Component Value Range Status Comment   Specimen Description BLOOD RIGHT ARM   Final    Special Requests BOTTLES DRAWN AEROBIC ONLY 4CC   Final    Culture NO GROWTH 5 DAYS   Final    Report Status 11/25/2011 FINAL   Final   URINE CULTURE     Status: Normal   Collection Time   11/20/11  7:10 PM      Component Value Range Status Comment   Specimen Description URINE, CATHETERIZED   Final    Special Requests NONE   Final    Culture  Setup Time 11/21/2011 21:23   Final    Colony Count NO GROWTH   Final    Culture NO GROWTH   Final    Report Status 11/22/2011 FINAL   Final   MRSA PCR SCREENING     Status: Normal   Collection Time   11/20/11 10:49 PM      Component Value Range Status Comment   MRSA by PCR NEGATIVE  NEGATIVE Final   URINE CULTURE     Status: Normal   Collection Time   11/23/11  1:40 PM      Component Value Range Status Comment   Specimen Description URINE, CLEAN CATCH   Final    Special Requests NONE   Final    Culture  Setup Time 11/23/2011 19:39   Final    Colony Count 15,000 COLONIES/ML   Final    Culture     Final    Value: Multiple bacterial morphotypes present, none predominant. Suggest appropriate recollection if clinically indicated.  Report Status 11/25/2011 FINAL   Final   URINE CULTURE     Status: Normal   Collection Time   12/07/11  7:18 PM      Component Value Range Status Comment   Specimen Description URINE, CLEAN CATCH   Final    Special Requests NONE   Final    Culture  Setup Time 12/08/2011 11:15   Final    Colony Count >=100,000 COLONIES/ML   Final    Culture PROTEUS MIRABILIS   Final    Report Status 12/11/2011 FINAL   Final    Organism ID, Bacteria PROTEUS MIRABILIS   Final   CULTURE, BLOOD (ROUTINE X 2)     Status: Normal (Preliminary result)   Collection Time   12/07/11  8:27 PM      Component Value Range Status Comment   Specimen Description BLOOD RIGHT ANTECUBITAL   Final    Special Requests BOTTLES DRAWN AEROBIC AND ANAEROBIC 8CC   Final    Culture NO GROWTH 3 DAYS   Final    Report Status PENDING   Incomplete   CULTURE, BLOOD (ROUTINE X 2)     Status: Normal (Preliminary result)   Collection Time   12/07/11  8:45 PM      Component Value Range Status Comment   Specimen Description BLOOD RIGHT HAND   Final     Special Requests BOTTLES DRAWN AEROBIC ONLY 7CC   Final    Culture NO GROWTH 3 DAYS   Final    Report Status PENDING   Incomplete   MRSA PCR SCREENING     Status: Normal   Collection Time   12/08/11  2:26 AM      Component Value Range Status Comment   MRSA by PCR NEGATIVE  NEGATIVE Final    Medical History: Past Medical History  Diagnosis Date  . COPD (chronic obstructive pulmonary disease)   . Shortness of breath   . Asthma   . DEMENTIA     short term  . Hypertension   . Arthritis   . Hypothyroidism   . CHF (congestive heart failure)   . BPH (benign prostatic hyperplasia)   . Hiatal hernia   . Neuropathy    Medications:  Scheduled:     . albuterol  2.5 mg Nebulization QID  . ALPRAZolam  0.375 mg Oral QHS  . aspirin EC  81 mg Oral Daily  . calcium-vitamin D  1 tablet Oral Daily  . cefTRIAXone (ROCEPHIN)  IV  1 g Intravenous Q24H  . digoxin  125 mcg Oral Daily  . diltiazem  120 mg Oral Daily  . enoxaparin (LOVENOX) injection  40 mg Subcutaneous Q24H  . furosemide  40 mg Oral Daily  . gabapentin  600 mg Oral QID  . ipratropium  0.5 mg Nebulization QID  . levofloxacin (LEVAQUIN) IV  500 mg Intravenous Q24H  . levofloxacin  500 mg Oral Daily  . levothyroxine  50 mcg Oral QAC breakfast  . lisinopril  20 mg Oral Daily  . omega-3 acid ethyl esters  1 g Oral Daily  . PARoxetine  40 mg Oral Daily  . polyethylene glycol  17 g Oral Daily  . potassium chloride  10 mEq Oral Daily  . senna  3 tablet Oral QHS  . sodium chloride  3 mL Intravenous Q12H  . Tamsulosin HCl  0.4 mg Oral QHS  . vancomycin  1,250 mg Intravenous Q24H  . vitamin C  500 mg Oral Daily  . vitamin  E  400 Units Oral Daily   Assessment: 89yoM with recurrent pna.  Estimated Creatinine Clearance: 32.3 ml/min (by C-G formula based on Cr of 1.49). Elevated trough level.  Goal of Therapy:  Vancomycin trough level 15-20 mcg/ml  Plan: Reduce Vancomycin 1000mg  iv q24hrs Repeat level in 3-4 days if  continues. Monitor labs, renal fxn, and cultures per protocol  Mady Gemma 12/11/2011,2:25 PM

## 2011-12-11 NOTE — Progress Notes (Signed)
Subjective: He continues to improve.  Objective: Vital signs in last 24 hours: Temp:  [98.1 F (36.7 C)-98.3 F (36.8 C)] 98.3 F (36.8 C) (07/25 0549) Pulse Rate:  [60-75] 75  (07/25 0549) Resp:  [18-20] 18  (07/25 0549) BP: (108-118)/(56-71) 118/71 mmHg (07/25 0549) SpO2:  [93 %-100 %] 93 % (07/25 0734) Weight change:  Last BM Date: 12/10/11  Intake/Output from previous day: 07/24 0701 - 07/25 0700 In: 480 [P.O.:480] Out: -   PHYSICAL EXAM General appearance: alert, cooperative, no distress and confused Resp: rhonchi bilaterally Cardio: S1, S2 normal GI: soft, non-tender; bowel sounds normal; no masses,  no organomegaly Extremities: extremities normal, atraumatic, no cyanosis or edema  Lab Results:    Basic Metabolic Panel:  Basename 12/10/11 0539 12/08/11 0839  NA 138 134*  K 4.5 4.3  CL 99 95*  CO2 31 30  GLUCOSE 101* 181*  BUN 41* 37*  CREATININE 1.49* 1.34  CALCIUM 9.9 10.1  MG -- --  PHOS -- --   Liver Function Tests: No results found for this basename: AST:2,ALT:2,ALKPHOS:2,BILITOT:2,PROT:2,ALBUMIN:2 in the last 72 hours No results found for this basename: LIPASE:2,AMYLASE:2 in the last 72 hours No results found for this basename: AMMONIA:2 in the last 72 hours CBC: No results found for this basename: WBC:2,NEUTROABS:2,HGB:2,HCT:2,MCV:2,PLT:2 in the last 72 hours Cardiac Enzymes: No results found for this basename: CKTOTAL:3,CKMB:3,CKMBINDEX:3,TROPONINI:3 in the last 72 hours BNP: No results found for this basename: PROBNP:3 in the last 72 hours D-Dimer: No results found for this basename: DDIMER:2 in the last 72 hours CBG: No results found for this basename: GLUCAP:6 in the last 72 hours Hemoglobin A1C: No results found for this basename: HGBA1C in the last 72 hours Fasting Lipid Panel: No results found for this basename: CHOL,HDL,LDLCALC,TRIG,CHOLHDL,LDLDIRECT in the last 72 hours Thyroid Function Tests: No results found for this basename:  TSH,T4TOTAL,FREET4,T3FREE,THYROIDAB in the last 72 hours Anemia Panel: No results found for this basename: VITAMINB12,FOLATE,FERRITIN,TIBC,IRON,RETICCTPCT in the last 72 hours Coagulation: No results found for this basename: LABPROT:2,INR:2 in the last 72 hours Urine Drug Screen: Drugs of Abuse  No results found for this basename: labopia, cocainscrnur, labbenz, amphetmu, thcu, labbarb    Alcohol Level: No results found for this basename: ETH:2 in the last 72 hours Urinalysis: No results found for this basename: COLORURINE:2,APPERANCEUR:2,LABSPEC:2,PHURINE:2,GLUCOSEU:2,HGBUR:2,BILIRUBINUR:2,KETONESUR:2,PROTEINUR:2,UROBILINOGEN:2,NITRITE:2,LEUKOCYTESUR:2 in the last 72 hours Misc. Labs:  ABGS No results found for this basename: PHART,PCO2,PO2ART,TCO2,HCO3 in the last 72 hours CULTURES Recent Results (from the past 240 hour(s))  URINE CULTURE     Status: Normal (Preliminary result)   Collection Time   12/07/11  7:18 PM      Component Value Range Status Comment   Specimen Description URINE, CLEAN CATCH   Final    Special Requests NONE   Final    Culture  Setup Time 12/08/2011 11:15   Final    Colony Count >=100,000 COLONIES/ML   Final    Culture PROTEUS MIRABILIS   Final    Report Status PENDING   Incomplete   CULTURE, BLOOD (ROUTINE X 2)     Status: Normal (Preliminary result)   Collection Time   12/07/11  8:27 PM      Component Value Range Status Comment   Specimen Description BLOOD RIGHT ANTECUBITAL   Final    Special Requests BOTTLES DRAWN AEROBIC AND ANAEROBIC 8CC   Final    Culture NO GROWTH 2 DAYS   Final    Report Status PENDING   Incomplete   CULTURE, BLOOD (ROUTINE X  2)     Status: Normal (Preliminary result)   Collection Time   12/07/11  8:45 PM      Component Value Range Status Comment   Specimen Description BLOOD RIGHT HAND   Final    Special Requests BOTTLES DRAWN AEROBIC ONLY 7CC   Final    Culture NO GROWTH 2 DAYS   Final    Report Status PENDING   Incomplete     MRSA PCR SCREENING     Status: Normal   Collection Time   12/08/11  2:26 AM      Component Value Range Status Comment   MRSA by PCR NEGATIVE  NEGATIVE Final    Studies/Results: Dg Swallowing Function  12/09/2011  *RADIOLOGY REPORT*  Clinical Data: Dysphagia, multiple recent hospital admissions, COPD, asthma, dementia, hypertension, CHF  SWALLOWING FUNCTION STUDY:  Technique:  Patient swallowed varying consistencies of barium with the assistance of a speech pathologist.  I performed real-time fluoroscopic assessment in the lateral projection.  Fluoroscopy time:  3.5 minutes  Comparison:  None  Findings: With applesauce consistency, normal motion is seen without laryngeal penetration or aspiration.  Vallecular residual noted.  With thin barium by teaspoon, spillover of contrast to the piriform sinus occurred.  Laryngeal penetration identified without aspiration or significant residuals.  Thin barium by cup demonstrated delayed initiation, spillover of contrast to the piriform sinuses with laryngeal penetration and aspiration to just below the vocal cords.  Minimal vallecular residual.  Repeat swallows of thin barium by straw demonstrated laryngeal penetration and aspiration.  No spontaneous cough reflex identified.  With nectar consistency by cup, delayed initiation of swallow was seen with spillover of contrast to the piriform sinuses.  Minimal laryngeal penetration on 1/3 swallows without aspiration or significant residuals.  With nectar consistency, the patient swallowed a 12.5 mm diameter barium tablet.  The pill transiently lodged in the vallecula but cleared with a repeat swallow.  Laryngeal penetration and aspiration of the liquid phase contrast were identified. Vallecular and piriform sinus residuals were noted which cleared spontaneously with a second swallow.  IMPRESSION: Swallowing dysfunction as above.  Original Report Authenticated By: Lollie Marrow, M.D.    Medications:  Scheduled:   .  albuterol  2.5 mg Nebulization QID  . ALPRAZolam  0.375 mg Oral QHS  . aspirin EC  81 mg Oral Daily  . calcium-vitamin D  1 tablet Oral Daily  . cefTRIAXone (ROCEPHIN)  IV  1 g Intravenous Q24H  . digoxin  125 mcg Oral Daily  . diltiazem  120 mg Oral Daily  . enoxaparin (LOVENOX) injection  40 mg Subcutaneous Q24H  . furosemide  40 mg Oral Daily  . gabapentin  600 mg Oral QID  . ipratropium  0.5 mg Nebulization QID  . levofloxacin (LEVAQUIN) IV  500 mg Intravenous Q24H  . levothyroxine  50 mcg Oral QAC breakfast  . lisinopril  20 mg Oral Daily  . omega-3 acid ethyl esters  1 g Oral Daily  . PARoxetine  40 mg Oral Daily  . polyethylene glycol  17 g Oral Daily  . potassium chloride  10 mEq Oral Daily  . senna  3 tablet Oral QHS  . sodium chloride  3 mL Intravenous Q12H  . Tamsulosin HCl  0.4 mg Oral QHS  . vancomycin  1,250 mg Intravenous Q24H  . vitamin C  500 mg Oral Daily  . vitamin E  400 Units Oral Daily   Continuous:  OZH:YQMVHQ chloride, RESOURCE THICKENUP CLEAR, sodium chloride  Assesment: He was admitted with recurrent pneumonia. This was healthcare associated. He seems to be having high risk of aspiration. He is improving. Active Problems:  * No active hospital problems. *     Plan: Continue current treatments    LOS: 3 days   Xoey Warmoth L 12/11/2011, 8:12 AM

## 2011-12-11 NOTE — Progress Notes (Signed)
Physical Therapy Treatment Patient Details Name: [name] MRN: [id number] DOB: [birth year] Today's Date: 12/11/2011 Time: 0272-5366 PT Time Calculation (min): 42 min  PT Assessment / Plan / Recommendation Comments on Treatment Session  Pt continues to have limited mobility, but this is probably his maximal potential.  We need to be sure to at least keep what he has    Follow Up Recommendations  Home health PT    Barriers to Discharge        Equipment Recommendations       Recommendations for Other Services    Frequency Min 3X/week   Plan Discharge plan remains appropriate;Frequency remains appropriate;Frequency needs to be updated    Precautions / Restrictions     Pertinent Vitals/Pain     Mobility  Bed Mobility Bed Mobility: Supine to Sit Supine to Sit: 4: Min guard;HOB flat Sit to Supine: Not Tested (comment) Transfers Sit to Stand: 4: Min assist;From bed;With upper extremity assist Stand to Sit: 5: Supervision;With upper extremity assist;To chair/3-in-1 Ambulation/Gait Ambulation/Gait Assistance: 3: Mod assist Ambulation Distance (Feet): 15 Feet Assistive device: Rolling walker Ambulation/Gait Assistance Details: Pt's trunk is very flexed over the walker today, manual assist needed to keep pt upright. Gait Pattern: Trunk flexed;Decreased step length - left;Decreased step length - right Gait velocity: very slow Stairs: No Wheelchair Mobility Wheelchair Mobility: No    Exercises General Exercises - Lower Extremity Ankle Circles/Pumps: AROM;Both;10 reps;Supine Short Arc Quad: AROM;Both;10 reps;Supine Heel Slides: AROM;Both;10 reps;Supine Hip ABduction/ADduction: AROM;Both;10 reps;Supine   PT Diagnosis:    PT Problem List:   PT Treatment Interventions:     PT Goals Acute Rehab PT Goals PT Goal: Supine/Side to Sit - Progress: Not progressing Pt will go Sit to Stand: with supervision PT Goal: Sit to Stand - Progress: Updated due to goal met Pt will go Stand  to Sit: with modified independence PT Goal: Stand to Sit - Progress: Updated due to goals met PT Goal: Ambulate - Progress: Progressing toward goal  Visit Information  Last PT Received On: 12/11/11    Subjective Data  Subjective: I'm very weak   Cognition       Balance     End of Session PT - End of Session Equipment Utilized During Treatment: Gait belt Activity Tolerance: Patient tolerated treatment well;Patient limited by fatigue Patient left: in chair;with call bell/phone within reach;with chair alarm set Nurse Communication: Mobility status   GP     Konrad Penta 12/11/2011, 10:28 AM

## 2011-12-11 NOTE — Clinical Social Work Note (Signed)
Spoke w patient at bedside.  Patient expressed concern about not being able to return to [facility].  Explained to patient that facility does not accept patients w his need for thick liquids but assured patient that we would seek alternative placement to meet his need.  [facility] assessed patient.  [facility] can take patient requiring thick liquids, however facility does not have a companion room at [place] facility.  [facility] has room for patient and can meet his needs.  Patient's daughter in law was in room w patient during assessment, and states that family is agreeable to patient transferring to [facility].  Provided daughter in law w list of ALFs in [facility] as well as SNF Placement Handbook as family had questions about SNF placement.  Explained that PT had not recommended SNF level of care, and family seemed to understand.  Patient will require PASARR as he is changing facility.  PASARR requested today and FL2 prepared.  Facility faxed H and P, FL2,  and list of medications as requested.  Clovis Cao Clinical Social Worker [phone])

## 2011-12-11 NOTE — Progress Notes (Signed)
Speech Language Pathology Dysphagia Treatment Patient Details Name: [name] MRN: [id number] DOB: 10/25/1917 Today's Date: 12/11/2011 Time: 1630-1700 SLP Time Calculation (min): 30 min  Assessment / Plan / Recommendation Clinical Impression  Pt somewhat distressed that he is unable to return to [facility] due to being on thickened liquids at this time. He reports tolerating the thickened liquids (nurse confirms), but declines trials at this time. Strategies were reviewed and reinforced verbally and written handouts provided for family.    Diet Recommendation  Continue with Current Diet: Dysphagia 3 (mechanical soft);Nectar-thick liquid    SLP Plan Continue with current plan of care      Swallowing Goals  SLP Swallowing Goals Patient will consume recommended diet without observed clinical signs of aspiration with: Moderate assistance Swallow Study Goal #1 - Progress: Progressing toward goal Patient will utilize recommended strategies during swallow to increase swallowing safety with: Moderate assistance Swallow Study Goal #2 - Progress: Progressing toward goal  General Temperature Spikes Noted: No Respiratory Status: Supplemental O2 delivered via (comment) Behavior/Cognition: Alert;Cooperative;Hard of hearing Oral Cavity - Dentition: Adequate natural dentition (missing molars) Patient Positioning: Upright in bed  Oral Cavity - Oral Hygiene     Dysphagia Treatment Treatment focused on: Patient/family/caregiver education;Utilization of compensatory strategies Family/Caregiver Educated: Patient, left papers for family and spoke with son, [name] Hua on phone today Patient observed directly with PO's: No Reason PO's not observed: Refused Feeding: Total assist      [name] 12/11/2011, 5:40 PM

## 2011-12-11 NOTE — Clinical Social Work Placement (Signed)
    Clinical Social Work Department CLINICAL SOCIAL WORK PLACEMENT NOTE 12/15/2011  Patient:  DEMONT, LINFORD  Account Number:  [id number] Admit date:  12/08/2011  Clinical Social Worker:  Santa Genera, CLINICAL SOCIAL WORKER  Date/time:  12/08/2011 10:30 AM  Clinical Social Work is seeking post-discharge placement for this patient at the following level of care:   SKILLED NURSING   (*CSW will update this form in Epic as items are completed)   12/15/2011  Patient/family provided with [facility] Department of Clinical Social Work's list of facilities offering this level of care within the geographic area requested by the patient (or if unable, by the patient's family).  12/15/2011  Patient/family informed of their freedom to choose among providers that offer the needed level of care, that participate in Medicare, Medicaid or managed care program needed by the patient, have an available bed and are willing to accept the patient.  12/15/2011  Patient/family informed of [facility]' ownership interest in [facility], as well as of the fact that they are under no obligation to receive care at this facility.  PASARR submitted to EDS on 12/15/2011 PASARR number received from EDS on 12/15/2011  FL2 transmitted to all facilities in geographic area requested by pt/family on  12/15/2011 FL2 transmitted to all facilities within larger geographic area on   Patient informed that his/her managed care company has contracts with or will negotiate with  certain facilities, including the following:     Patient/family informed of bed offers received:  12/12/2011 Patient chooses bed at  Physician recommends and patient chooses bed at    Patient to be transferred to  on   Patient to be transferred to facility by   The following physician request were entered in Epic:   Additional Comments: [facility] will not accept patient at discharge w thick liquid diet.  Family requests  transfer to [facility] and to be placed on waiting list for [facility].  Family encouraged to call business office at ALF to verify that Medicaid will cover patient placement at facility. Pt and family accept bed offer at [facility] on 12/12/11.  PT reevaluated patient today and recommended rehab at SNF level.  Recommendation relayed to POA Andy Gauss.  Mr Espinoza requested that we seek bed offers from [facility], [facility] [place] and [facility].  Have faxed out his information and requested that afternoon CSW follow up w facility and family.  CSW called Toniann Fail at [facility] and informed her of PT recommendation change.  Clovis Cao Clinical Social Worker [phone])

## 2011-12-11 NOTE — Progress Notes (Signed)
Subjective: Patient feels better. No fever chills. His breathing is improving. No new complaint.  Objective: Vital signs in last 24 hours: Temp:  [98.1 F (36.7 C)-98.3 F (36.8 C)] 98.3 F (36.8 C) (07/25 0549) Pulse Rate:  [60-75] 75  (07/25 0549) Resp:  [18-20] 18  (07/25 0549) BP: (108-118)/(56-71) 118/71 mmHg (07/25 0549) SpO2:  [93 %-100 %] 99 % (07/25 0549) Weight change:  Last BM Date: 12/10/11  Intake/Output from previous day: 07/24 0701 - 07/25 0700 In: 480 [P.O.:480] Out: -   PHYSICAL EXAM General appearance: alert and no distress Resp: diminished breath sounds bilaterally and rhonchi bibasilar Cardio: S1, S2 normal GI: soft, non-tender; bowel sounds normal; no masses,  no organomegaly Extremities: extremities normal, atraumatic, no cyanosis or edema  Lab Results:    @labtest @ AB[facility] No results found for this basename: PHART,PCO2,PO2ART,TCO2,HCO3 in the last 72 hours CULTURES Recent Results (from the past 240 hour(s))  URINE CULTURE     Status: Normal (Preliminary result)   Collection Time   12/07/11  7:18 PM      Component Value Range Status Comment   Specimen Description URINE, CLEAN CATCH   Final    Special Requests NONE   Final    Culture  Setup Time 12/08/2011 11:15   Final    Colony Count >=100,000 COLONIES/ML   Final    Culture PROTEUS MIRABILIS   Final    Report Status PENDING   Incomplete   CULTURE, BLOOD (ROUTINE X 2)     Status: Normal (Preliminary result)   Collection Time   12/07/11  8:27 PM      Component Value Range Status Comment   Specimen Description BLOOD RIGHT ANTECUBITAL   Final    Special Requests BOTTLES DRAWN AEROBIC AND ANAEROBIC 8CC   Final    Culture NO GROWTH 2 DAYS   Final    Report Status PENDING   Incomplete   CULTURE, BLOOD (ROUTINE X 2)     Status: Normal (Preliminary result)   Collection Time   12/07/11  8:45 PM      Component Value Range Status Comment   Specimen Description BLOOD RIGHT HAND   Final    Special  Requests BOTTLES DRAWN AEROBIC ONLY 7CC   Final    Culture NO GROWTH 2 DAYS   Final    Report Status PENDING   Incomplete   MRSA PCR SCREENING     Status: Normal   Collection Time   12/08/11  2:26 AM      Component Value Range Status Comment   MRSA by PCR NEGATIVE  NEGATIVE Final    Studies/Results: Dg Swallowing Function  12/09/2011  *RADIOLOGY REPORT*  Clinical Data: Dysphagia, multiple recent hospital admissions, COPD, asthma, dementia, hypertension, CHF  SWALLOWING FUNCTION STUDY:  Technique:  Patient swallowed varying consistencies of barium with the assistance of a speech pathologist.  I performed real-time fluoroscopic assessment in the lateral projection.  Fluoroscopy time:  3.5 minutes  Comparison:  None  Findings: With applesauce consistency, normal motion is seen without laryngeal penetration or aspiration.  Vallecular residual noted.  With thin barium by teaspoon, spillover of contrast to the piriform sinus occurred.  Laryngeal penetration identified without aspiration or significant residuals.  Thin barium by cup demonstrated delayed initiation, spillover of contrast to the piriform sinuses with laryngeal penetration and aspiration to just below the vocal cords.  Minimal vallecular residual.  Repeat swallows of thin barium by straw demonstrated laryngeal penetration and aspiration.  No spontaneous cough reflex  identified.  With nectar consistency by cup, delayed initiation of swallow was seen with spillover of contrast to the piriform sinuses.  Minimal laryngeal penetration on 1/3 swallows without aspiration or significant residuals.  With nectar consistency, the patient swallowed a 12.5 mm diameter barium tablet.  The pill transiently lodged in the vallecula but cleared with a repeat swallow.  Laryngeal penetration and aspiration of the liquid phase contrast were identified. Vallecular and piriform sinus residuals were noted which cleared spontaneously with a second swallow.  IMPRESSION:  Swallowing dysfunction as above.  Original Report Authenticated By: Lollie Marrow, M.D.    Medications: I have reviewed the patient's current medications.  Assesment:1. Health care associated pneumonia                      2. COPD                      3. CHF                       4. Dementia Active Problems:  * No active hospital problems. *     Plan: Continue iv antibiotics, nebulizer treatment, regular treatment and physical therapy    LOS: 3 days   Laramie Gelles 12/11/2011, 7:31 AM

## 2011-12-12 ENCOUNTER — Inpatient Hospital Stay (HOSPITAL_COMMUNITY): Payer: Medicare Other

## 2011-12-12 DIAGNOSIS — F0390 Unspecified dementia without behavioral disturbance: Secondary | ICD-10-CM | POA: Diagnosis present

## 2011-12-12 DIAGNOSIS — J690 Pneumonitis due to inhalation of food and vomit: Secondary | ICD-10-CM | POA: Diagnosis present

## 2011-12-12 DIAGNOSIS — J189 Pneumonia, unspecified organism: Secondary | ICD-10-CM | POA: Diagnosis present

## 2011-12-12 LAB — BASIC METABOLIC PANEL
BUN: 46 mg/dL — ABNORMAL HIGH (ref 6–23)
Creatinine, Ser: 1.57 mg/dL — ABNORMAL HIGH (ref 0.50–1.35)
GFR calc Af Amer: 43 mL/min — ABNORMAL LOW (ref >90)
GFR calc non Af Amer: 37 mL/min — ABNORMAL LOW (ref >90)
Potassium: 4.7 mEq/L (ref 3.5–5.1)

## 2011-12-12 MED ORDER — BIOTENE DRY MOUTH MT LIQD
15.0000 mL | Freq: Two times a day (BID) | OROMUCOSAL | Status: DC
  Administered 2011-12-12 – 2011-12-16 (×9): 15 mL via OROMUCOSAL

## 2011-12-12 NOTE — Progress Notes (Signed)
Subjective: Patient is resting. He coughs intermittently. He is also wheezing. No fever or chills.  Objective: Vital signs in last 24 hours: Temp:  [98.2 F (36.8 C)-98.7 F (37.1 C)] 98.2 F (36.8 C) (07/26 0651) Pulse Rate:  [61] 61  (07/25 1348) Resp:  [18] 18  (07/26 0651) BP: (105-118)/(62-66) 118/66 mmHg (07/26 0651) SpO2:  [89 %-98 %] 95 % (07/26 0713) Weight change:  Last BM Date: 12/10/11  Intake/Output from previous day: 07/25 0701 - 07/26 0700 In: 1665 [P.O.:750; I.V.:265; IV Piggyback:650] Out: -   PHYSICAL EXAM General appearance: alert and no distress Resp: diminished breath sounds bilaterally, rhonchi bilaterally and wheezes bilaterally Cardio: S1, S2 normal GI: soft, non-tender; bowel sounds normal; no masses,  no organomegaly Extremities: extremities normal, atraumatic, no cyanosis or edema  Lab Results:    @labtest @ [facility] No results found for this basename: PHART,PCO2,PO2ART,TCO2,HCO3 in the last 72 hours CULTURES Recent Results (from the past 240 hour(s))  URINE CULTURE     Status: Normal   Collection Time   12/07/11  7:18 PM      Component Value Range Status Comment   Specimen Description URINE, CLEAN CATCH   Final    Special Requests NONE   Final    Culture  Setup Time 12/08/2011 11:15   Final    Colony Count >=100,000 COLONIES/ML   Final    Culture PROTEUS MIRABILIS   Final    Report Status 12/11/2011 FINAL   Final    Organism ID, Bacteria PROTEUS MIRABILIS   Final   CULTURE, BLOOD (ROUTINE X 2)     Status: Normal (Preliminary result)   Collection Time   12/07/11  8:27 PM      Component Value Range Status Comment   Specimen Description BLOOD RIGHT ANTECUBITAL   Final    Special Requests BOTTLES DRAWN AEROBIC AND ANAEROBIC 8CC   Final    Culture NO GROWTH 3 DAYS   Final    Report Status PENDING   Incomplete   CULTURE, BLOOD (ROUTINE X 2)     Status: Normal (Preliminary result)   Collection Time   12/07/11  8:45 PM      Component Value Range  Status Comment   Specimen Description BLOOD RIGHT HAND   Final    Special Requests BOTTLES DRAWN AEROBIC ONLY 7CC   Final    Culture NO GROWTH 3 DAYS   Final    Report Status PENDING   Incomplete   MRSA PCR SCREENING     Status: Normal   Collection Time   12/08/11  2:26 AM      Component Value Range Status Comment   MRSA by PCR NEGATIVE  NEGATIVE Final    Studies/Results: No results found.  Medications: I have reviewed the patient's current medications.  Assesment:1. Health care associated pneumonia                     2. COPD                      3. CHF                       4. Dementia                         Active Problems:  * No active hospital problems. *     Plan:1. Continue IV antibiotics  2. Continue nebulizer treatment           3. Repeat chest x-ray    LOS: 4 days   Jozalyn Baglio 12/12/2011, 7:51 AM

## 2011-12-12 NOTE — Progress Notes (Signed)
Physical Therapy Treatment Patient Details Name: [name] MRN: [id number] DOB: 08/15/1917 Today's Date: 12/12/2011 Time: 4098-1191 PT Time Calculation (min): 33 min  PT Assessment / Plan / Recommendation Comments on Treatment Session  No significant change.    Follow Up Recommendations       Barriers to Discharge        Equipment Recommendations       Recommendations for Other Services    Frequency     Plan Discharge plan remains appropriate;Frequency remains appropriate    Precautions / Restrictions     Pertinent Vitals/Pain     Mobility  Bed Mobility Supine to Sit: 4: Min guard;HOB elevated Transfers Sit to Stand: 4: Min guard;With upper extremity assist;From bed Stand to Sit: 4: Min guard;With upper extremity assist;To chair/3-in-1 Ambulation/Gait Ambulation/Gait Assistance: 4: Min assist Ambulation Distance (Feet): 3 Feet Assistive device: Rolling walker Ambulation/Gait Assistance Details: Pt very drowsy but much stronger than yesterday.  However, because of drowsiness, min gait was attempted Gait Pattern: Trunk flexed;Shuffle Stairs: No Wheelchair Mobility Wheelchair Mobility: No    Exercises General Exercises - Lower Extremity Ankle Circles/Pumps: AROM;Both;10 reps;Supine Quad Sets: AROM;Both;10 reps;Supine Short Arc Quad: AROM;Both;10 reps;Supine Heel Slides: AROM;Both;10 reps;Supine Hip ABduction/ADduction: AROM;Both;10 reps;Supine   PT Diagnosis:    PT Problem List:   PT Treatment Interventions:     PT Goals Acute Rehab PT Goals PT Goal: Supine/Side to Sit - Progress: Progressing toward goal PT Goal: Sit to Stand - Progress: Progressing toward goal PT Goal: Stand to Sit - Progress: Progressing toward goal PT Goal: Ambulate - Progress: Progressing toward goal  Visit Information  Last PT Received On: 12/12/11    Subjective Data  Subjective: I feel very sleepy   Cognition       Balance     End of Session PT - End of Session Equipment  Utilized During Treatment: Gait belt Activity Tolerance: Patient tolerated treatment well Patient left: in chair Nurse Communication: Mobility status   GP     Konrad Penta 12/12/2011, 10:13 AM

## 2011-12-12 NOTE — Progress Notes (Signed)
Subjective: He is overall about the same. He is still coughing.  Objective: Vital signs in last 24 hours: Temp:  [98.2 F (36.8 C)-98.7 F (37.1 C)] 98.2 F (36.8 C) (07/26 0651) Pulse Rate:  [61] 61  (07/25 1348) Resp:  [18] 18  (07/26 0651) BP: (105-118)/(62-66) 118/66 mmHg (07/26 0651) SpO2:  [89 %-98 %] 95 % (07/26 0713) Weight change:  Last BM Date: 12/10/11  Intake/Output from previous day: 07/25 0701 - 07/26 0700 In: 1665 [P.O.:750; I.V.:265; IV Piggyback:650] Out: -   PHYSICAL EXAM General appearance: alert, no distress and Mildly confused Resp: rhonchi bilaterally Cardio: regular rate and rhythm, S1, S2 normal, no murmur, click, rub or gallop GI: soft, non-tender; bowel sounds normal; no masses,  no organomegaly Extremities: extremities normal, atraumatic, no cyanosis or edema  Lab Results:    Basic Metabolic Panel:  Basename 12/12/11 0455 12/10/11 0539  NA 138 138  K 4.7 4.5  CL 101 99  CO2 31 31  GLUCOSE 117* 101*  BUN 46* 41*  CREATININE 1.57* 1.49*  CALCIUM 10.0 9.9  MG -- --  PHOS -- --   Liver Function Tests: No results found for this basename: AST:2,ALT:2,ALKPHOS:2,BILITOT:2,PROT:2,ALBUMIN:2 in the last 72 hours No results found for this basename: LIPASE:2,AMYLASE:2 in the last 72 hours No results found for this basename: AMMONIA:2 in the last 72 hours CBC: No results found for this basename: WBC:2,NEUTROABS:2,HGB:2,HCT:2,MCV:2,PLT:2 in the last 72 hours Cardiac Enzymes: No results found for this basename: CKTOTAL:3,CKMB:3,CKMBINDEX:3,TROPONINI:3 in the last 72 hours BNP: No results found for this basename: PROBNP:3 in the last 72 hours D-Dimer: No results found for this basename: DDIMER:2 in the last 72 hours CBG: No results found for this basename: GLUCAP:6 in the last 72 hours Hemoglobin A1C: No results found for this basename: HGBA1C in the last 72 hours Fasting Lipid Panel: No results found for this basename:  CHOL,HDL,LDLCALC,TRIG,CHOLHDL,LDLDIRECT in the last 72 hours Thyroid Function Tests: No results found for this basename: TSH,T4TOTAL,FREET4,T3FREE,THYROIDAB in the last 72 hours Anemia Panel: No results found for this basename: VITAMINB12,FOLATE,FERRITIN,TIBC,IRON,RETICCTPCT in the last 72 hours Coagulation: No results found for this basename: LABPROT:2,INR:2 in the last 72 hours Urine Drug Screen: Drugs of Abuse  No results found for this basename: labopia, cocainscrnur, labbenz, amphetmu, thcu, labbarb    Alcohol Level: No results found for this basename: ETH:2 in the last 72 hours Urinalysis: No results found for this basename: COLORURINE:2,APPERANCEUR:2,LABSPEC:2,PHURINE:2,GLUCOSEU:2,HGBUR:2,BILIRUBINUR:2,KETONESUR:2,PROTEINUR:2,UROBILINOGEN:2,NITRITE:2,LEUKOCYTESUR:2 in the last 72 hours Misc. Labs:  ABGS No results found for this basename: PHART,PCO2,PO2ART,TCO2,HCO3 in the last 72 hours CULTURES Recent Results (from the past 240 hour(s))  URINE CULTURE     Status: Normal   Collection Time   12/07/11  7:18 PM      Component Value Range Status Comment   Specimen Description URINE, CLEAN CATCH   Final    Special Requests NONE   Final    Culture  Setup Time 12/08/2011 11:15   Final    Colony Count >=100,000 COLONIES/ML   Final    Culture PROTEUS MIRABILIS   Final    Report Status 12/11/2011 FINAL   Final    Organism ID, Bacteria PROTEUS MIRABILIS   Final   CULTURE, BLOOD (ROUTINE X 2)     Status: Normal (Preliminary result)   Collection Time   12/07/11  8:27 PM      Component Value Range Status Comment   Specimen Description BLOOD RIGHT ANTECUBITAL   Final    Special Requests BOTTLES DRAWN AEROBIC AND ANAEROBIC 8CC  Final    Culture NO GROWTH 3 DAYS   Final    Report Status PENDING   Incomplete   CULTURE, BLOOD (ROUTINE X 2)     Status: Normal (Preliminary result)   Collection Time   12/07/11  8:45 PM      Component Value Range Status Comment   Specimen Description BLOOD  RIGHT HAND   Final    Special Requests BOTTLES DRAWN AEROBIC ONLY 7CC   Final    Culture NO GROWTH 3 DAYS   Final    Report Status PENDING   Incomplete   MRSA PCR SCREENING     Status: Normal   Collection Time   12/08/11  2:26 AM      Component Value Range Status Comment   MRSA by PCR NEGATIVE  NEGATIVE Final    Studies/Results: No results found.  Medications:  Scheduled:   . albuterol  2.5 mg Nebulization QID  . ALPRAZolam  0.375 mg Oral QHS  . antiseptic oral rinse  15 mL Mouth Rinse BID  . aspirin EC  81 mg Oral Daily  . calcium-vitamin D  1 tablet Oral Daily  . cefTRIAXone (ROCEPHIN)  IV  1 g Intravenous Q24H  . digoxin  125 mcg Oral Daily  . diltiazem  120 mg Oral Daily  . enoxaparin (LOVENOX) injection  40 mg Subcutaneous Q24H  . furosemide  40 mg Oral Daily  . gabapentin  600 mg Oral QID  . ipratropium  0.5 mg Nebulization QID  . levofloxacin (LEVAQUIN) IV  500 mg Intravenous Q24H  . levofloxacin  500 mg Oral Daily  . levothyroxine  50 mcg Oral QAC breakfast  . lisinopril  20 mg Oral Daily  . omega-3 acid ethyl esters  1 g Oral Daily  . PARoxetine  40 mg Oral Daily  . polyethylene glycol  17 g Oral Daily  . potassium chloride  10 mEq Oral Daily  . senna  3 tablet Oral QHS  . sodium chloride  3 mL Intravenous Q12H  . sodium chloride      . Tamsulosin HCl  0.4 mg Oral QHS  . vancomycin  1,000 mg Intravenous Q24H  . vitamin C  500 mg Oral Daily  . vitamin E  400 Units Oral Daily  . DISCONTD: vancomycin  1,250 mg Intravenous Q24H   Continuous:  ZOX:WRUEAV chloride, acetaminophen, RESOURCE THICKENUP CLEAR, sodium chloride  Assesment: He is overall better but I agree with Dr. [name] that he should receive prolonged treatment because of his multiple admissions for the same problem. I do believe he is aspirating Active Problems:  * No active hospital problems. *     Plan: Continue his antibiotics and other supportive    LOS: 4 days   Lena Gores  L 12/12/2011, 8:28 AM

## 2011-12-12 NOTE — Clinical Social Work Note (Addendum)
CSW spoke with pt's daughter-in-law who reports they were very pleased with [facility] and would like to accept bed offer. Robin at facility notified and requests further progress notes which CSW provided. Awaiting stability for d/c. CSW to continue to follow. Booklet on Dementia left in pt's room for family at their request.   Derenda Fennel, LCSW [phone]

## 2011-12-12 NOTE — Progress Notes (Signed)
Physical Therapy Treatment Patient Details Name: [name] MRN: [id number] DOB: June 02, 1917 Today's Date: 12/12/2011 Time: 4098-1191 PT Time Calculation (min): 25 min  PT Assessment / Plan / Recommendation Comments on Treatment Session  Pt expressed the desire to walk when I returned to his room to fetch my gait belt.  He was able to ambulate about 68' with assist to maintain walker close to him and to stand more erectly.  He needs very close guarding.    Follow Up Recommendations       Barriers to Discharge        Equipment Recommendations       Recommendations for Other Services    Frequency     Plan Discharge plan remains appropriate;Frequency remains appropriate    Precautions / Restrictions Restrictions Weight Bearing Restrictions: No   Pertinent Vitals/Pain     Mobility  Bed Mobility Supine to Sit: 4: Min guard;HOB elevated Transfers Sit to Stand: 3: Mod assist;With upper extremity assist;From chair/3-in-1 (chair sits very low to the ground) Stand to Sit: 2: Max assist Details for Transfer Assistance: Pt was extremely tired at end of gait and tried to sit prematurely.  He needed max assist to get into the chair Ambulation/Gait Ambulation/Gait Assistance: 4: Min assist Ambulation Distance (Feet): 25 Feet Assistive device: Rolling walker Ambulation/Gait Assistance Details: Pt very drowsy but much stronger than yesterday.  However, because of drowsiness, min gait was attempted Gait Pattern: Trunk flexed;Shuffle Stairs: No Wheelchair Mobility Wheelchair Mobility: No    Exercises General Exercises - Lower Extremity Ankle Circles/Pumps: AROM;Both;10 reps;Supine Quad Sets: AROM;Both;10 reps;Supine Short Arc Quad: AROM;Both;10 reps;Supine Heel Slides: AROM;Both;10 reps;Supine Hip ABduction/ADduction: AROM;Both;10 reps;Supine   PT Diagnosis:    PT Problem List:   PT Treatment Interventions:     PT Goals Acute Rehab PT Goals PT Goal: Supine/Side to Sit -  Progress: Progressing toward goal PT Goal: Sit to Stand - Progress: Progressing toward goal PT Goal: Stand to Sit - Progress: Progressing toward goal PT Goal: Ambulate - Progress: Progressing toward goal  Visit Information  Last PT Received On: 12/12/11    Subjective Data  Subjective: I would like to walk   Cognition       Balance     End of Session PT - End of Session Equipment Utilized During Treatment: Gait belt Activity Tolerance: Patient tolerated treatment well Patient left: in chair;with call bell/phone within reach;with chair alarm set;with family/visitor present Nurse Communication: Mobility status   GP     Myrlene Broker L 12/12/2011, 11:25 AM

## 2011-12-12 NOTE — Clinical Social Work Placement (Signed)
Clinical Social Work Department CLINICAL SOCIAL WORK PLACEMENT NOTE 12/12/2011  Patient:  JUERGEN, HARDENBROOK  Account Number:  [id number] Admit date:  12/08/2011  Clinical Social Worker:  Santa Genera, CLINICAL SOCIAL WORKER  Date/time:  12/08/2011 10:30 AM  Clinical Social Work is seeking post-discharge placement for this patient at the following level of care:   ASSISTED LIVING/REST HOME   (*CSW will update this form in Epic as items are completed)   12/11/2011  Patient/family provided with [facility] Department of Clinical Social Work's list of facilities offering this level of care within the geographic area requested by the patient (or if unable, by the patient's family).  12/11/2011  Patient/family informed of their freedom to choose among providers that offer the needed level of care, that participate in Medicare, Medicaid or managed care program needed by the patient, have an available bed and are willing to accept the patient.    Patient/family informed of [facility]' ownership interest in [facility], as well as of the fact that they are under no obligation to receive care at this facility.  PASARR submitted to [employer] on 12/11/2011 PASARR number received from [employer] on 12/11/2011  FL2 transmitted to all facilities in geographic area requested by pt/family on  12/11/2011 FL2 transmitted to all facilities within larger geographic area on   Patient informed that his/her managed care company has contracts with or will negotiate with  certain facilities, including the following:     Patient/family informed of bed offers received:  12/12/2011 Patient chooses bed at  Physician recommends and patient chooses bed at    Patient to be transferred to  on   Patient to be transferred to facility by   The following physician request were entered in Epic:   Additional Comments: [facility] will not accept patient at discharge w thick liquid diet.  Family requests transfer to  [facility] and to be placed on waiting list for [facility].  Family encouraged to call business office at ALF to verify that Medicaid will cover patient placement at facility. Pt and family accept bed offer at [facility] on 12/12/11.  Derenda Fennel, [place] [phone]

## 2011-12-13 LAB — CULTURE, BLOOD (ROUTINE X 2)
Culture: NO GROWTH
Culture: NO GROWTH

## 2011-12-13 LAB — CBC
Hemoglobin: 10.4 g/dL — ABNORMAL LOW (ref 13.0–17.0)
MCV: 87 fL (ref 78.0–100.0)
Platelets: 189 10*3/uL (ref 150–400)
RBC: 3.69 MIL/uL — ABNORMAL LOW (ref 4.22–5.81)
WBC: 8.6 10*3/uL (ref 4.0–10.5)

## 2011-12-13 MED ORDER — SODIUM CHLORIDE 0.9 % IJ SOLN
INTRAMUSCULAR | Status: AC
  Administered 2011-12-13: 10 mL
  Filled 2011-12-13: qty 3

## 2011-12-13 NOTE — Progress Notes (Signed)
Subjective:  Patient feels better. His breathing is improving. Less cough and congestion. No fever or chills.  Objective: Vital signs in last 24 hours: Temp:  [98.1 F (36.7 C)-98.7 F (37.1 C)] 98.3 F (36.8 C) (07/27 0504) Pulse Rate:  [58-69] 64  (07/27 0504) Resp:  [18] 18  (07/27 0504) BP: (107-120)/(55-69) 120/60 mmHg (07/27 0504) SpO2:  [92 %-98 %] 97 % (07/27 0708) Weight:  [86.047 kg (189 lb 11.2 oz)] 86.047 kg (189 lb 11.2 oz) (07/27 0504) Weight change:  Last BM Date: 12/10/11  Intake/Output from previous day: 07/26 0701 - 07/27 0700 In: 1093 [P.O.:840; I.V.:3; IV Piggyback:250] Out: 225 [Urine:225]  PHYSICAL EXAM General appearance: alert, cooperative and no distress Resp: diminished breath sounds bilaterally Cardio: S1, S2 normal GI: soft, non-tender; bowel sounds normal; no masses,  no organomegaly Extremities: edema   Lab Results:    @labtest @ [facility] No results found for this basename: PHART,PCO2,PO2ART,TCO2,HCO3 in the last 72 hours CULTURES Recent Results (from the past 240 hour(s))  URINE CULTURE     Status: Normal   Collection Time   12/07/11  7:18 PM      Component Value Range Status Comment   Specimen Description URINE, CLEAN CATCH   Final    Special Requests NONE   Final    Culture  Setup Time 12/08/2011 11:15   Final    Colony Count >=100,000 COLONIES/ML   Final    Culture PROTEUS MIRABILIS   Final    Report Status 12/11/2011 FINAL   Final    Organism ID, Bacteria PROTEUS MIRABILIS   Final   CULTURE, BLOOD (ROUTINE X 2)     Status: Normal   Collection Time   12/07/11  8:27 PM      Component Value Range Status Comment   Specimen Description BLOOD RIGHT ANTECUBITAL   Final    Special Requests BOTTLES DRAWN AEROBIC AND ANAEROBIC 8CC   Final    Culture NO GROWTH 5 DAYS   Final    Report Status 12/13/2011 FINAL   Final   CULTURE, BLOOD (ROUTINE X 2)     Status: Normal   Collection Time   12/07/11  8:45 PM      Component Value Range Status  Comment   Specimen Description BLOOD RIGHT HAND   Final    Special Requests BOTTLES DRAWN AEROBIC ONLY 7CC   Final    Culture NO GROWTH 5 DAYS   Final    Report Status 12/13/2011 FINAL   Final   MRSA PCR SCREENING     Status: Normal   Collection Time   12/08/11  2:26 AM      Component Value Range Status Comment   MRSA by PCR NEGATIVE  NEGATIVE Final    Studies/Results: Dg Chest Port 1 View  12/12/2011  *RADIOLOGY REPORT*  Clinical Data: Follow-up pneumonia.  COPD with asthma, hypertension and congestive heart failure  PORTABLE CHEST - 1 VIEW  Comparison: 12/07/2011  Findings: Cardiomegaly is again identified and is unchanged in appearance. The shape of the heart raises the possibility of pericardial effusion although global cardiomegaly can have a similar appearance.  The mediastinal contour demonstrates calcification in the aortic arch and some prominence of the aortic contour.  This is unchanged in comparison with prior exam.  Some improvement in aeration is noted at the right base compatible with improving right lower lobe pneumonia.  Persistent density at the left lung base may reflect atelectasis with the possibility of left lower lobe infiltrate not  excluded.  No definite pleural fluid is seen.  No new infiltrates are identified and no signs of congestive failure are suggested  IMPRESSION: Persistent unchanged cardiomegaly with cardiac configuration possibly due to global cardiac enlargement or pericardial effusion.  Improving right lower lobe infiltrate with persistent left lower lobe infiltrate/atelectasis.  Original Report Authenticated By: Bertha Stakes, M.D.    Medications: I have reviewed the patient's current medications.  Assesment: Principal Problem:  *Healthcare-associated pneumonia Active Problems:  Aspiration pneumonia  Dementia    Plan:Continue iv antibiotics. Continue current treatment. Continue physical therapy.    LOS: 5 days   Deah Ottaway 12/13/2011, 12:37  PM

## 2011-12-13 NOTE — Progress Notes (Signed)
Subjective: He is awake and confused. He has no new complaints  Objective: Vital signs in last 24 hours: Temp:  [98.1 F (36.7 C)-98.7 F (37.1 C)] 98.3 F (36.8 C) (07/27 0504) Pulse Rate:  [58-69] 64  (07/27 0504) Resp:  [18] 18  (07/27 0504) BP: (107-120)/(55-69) 120/60 mmHg (07/27 0504) SpO2:  [92 %-98 %] 97 % (07/27 0708) Weight:  [86.047 kg (189 lb 11.2 oz)] 86.047 kg (189 lb 11.2 oz) (07/27 0504) Weight change:  Last BM Date: 12/10/11  Intake/Output from previous day: 07/26 0701 - 07/27 0700 In: 1093 [P.O.:840; I.V.:3; IV Piggyback:250] Out: 225 [Urine:225]  PHYSICAL EXAM General appearance: alert, cooperative and Confused Resp: rhonchi bilaterally Cardio: regular rate and rhythm, S1, S2 normal, no murmur, click, rub or gallop GI: soft, non-tender; bowel sounds normal; no masses,  no organomegaly Extremities: extremities normal, atraumatic, no cyanosis or edema  Lab Results:    Basic Metabolic Panel:  Basename 12/12/11 0455  NA 138  K 4.7  CL 101  CO2 31  GLUCOSE 117*  BUN 46*  CREATININE 1.57*  CALCIUM 10.0  MG --  PHOS --   Liver Function Tests: No results found for this basename: AST:2,ALT:2,ALKPHOS:2,BILITOT:2,PROT:2,ALBUMIN:2 in the last 72 hours No results found for this basename: LIPASE:2,AMYLASE:2 in the last 72 hours No results found for this basename: AMMONIA:2 in the last 72 hours CBC:  Basename 12/13/11 0542  WBC 8.6  NEUTROABS --  HGB 10.4*  HCT 32.1*  MCV 87.0  PLT 189   Cardiac Enzymes: No results found for this basename: CKTOTAL:3,CKMB:3,CKMBINDEX:3,TROPONINI:3 in the last 72 hours BNP: No results found for this basename: PROBNP:3 in the last 72 hours D-Dimer: No results found for this basename: DDIMER:2 in the last 72 hours CBG: No results found for this basename: GLUCAP:6 in the last 72 hours Hemoglobin A1C: No results found for this basename: HGBA1C in the last 72 hours Fasting Lipid Panel: No results found for this  basename: CHOL,HDL,LDLCALC,TRIG,CHOLHDL,LDLDIRECT in the last 72 hours Thyroid Function Tests: No results found for this basename: TSH,T4TOTAL,FREET4,T3FREE,THYROIDAB in the last 72 hours Anemia Panel: No results found for this basename: VITAMINB12,FOLATE,FERRITIN,TIBC,IRON,RETICCTPCT in the last 72 hours Coagulation: No results found for this basename: LABPROT:2,INR:2 in the last 72 hours Urine Drug Screen: Drugs of Abuse  No results found for this basename: labopia, cocainscrnur, labbenz, amphetmu, thcu, labbarb    Alcohol Level: No results found for this basename: ETH:2 in the last 72 hours Urinalysis: No results found for this basename: COLORURINE:2,APPERANCEUR:2,LABSPEC:2,PHURINE:2,GLUCOSEU:2,HGBUR:2,BILIRUBINUR:2,KETONESUR:2,PROTEINUR:2,UROBILINOGEN:2,NITRITE:2,LEUKOCYTESUR:2 in the last 72 hours Misc. Labs:  ABGS No results found for this basename: PHART,PCO2,PO2ART,TCO2,HCO3 in the last 72 hours CULTURES Recent Results (from the past 240 hour(s))  URINE CULTURE     Status: Normal   Collection Time   12/07/11  7:18 PM      Component Value Range Status Comment   Specimen Description URINE, CLEAN CATCH   Final    Special Requests NONE   Final    Culture  Setup Time 12/08/2011 11:15   Final    Colony Count >=100,000 COLONIES/ML   Final    Culture PROTEUS MIRABILIS   Final    Report Status 12/11/2011 FINAL   Final    Organism ID, Bacteria PROTEUS MIRABILIS   Final   CULTURE, BLOOD (ROUTINE X 2)     Status: Normal   Collection Time   12/07/11  8:27 PM      Component Value Range Status Comment   Specimen Description BLOOD RIGHT ANTECUBITAL  Final    Special Requests BOTTLES DRAWN AEROBIC AND ANAEROBIC 8CC   Final    Culture NO GROWTH 5 DAYS   Final    Report Status 12/13/2011 FINAL   Final   CULTURE, BLOOD (ROUTINE X 2)     Status: Normal   Collection Time   12/07/11  8:45 PM      Component Value Range Status Comment   Specimen Description BLOOD RIGHT HAND   Final     Special Requests BOTTLES DRAWN AEROBIC ONLY 7CC   Final    Culture NO GROWTH 5 DAYS   Final    Report Status 12/13/2011 FINAL   Final   MRSA PCR SCREENING     Status: Normal   Collection Time   12/08/11  2:26 AM      Component Value Range Status Comment   MRSA by PCR NEGATIVE  NEGATIVE Final    Studies/Results: Dg Chest Port 1 View  12/12/2011  *RADIOLOGY REPORT*  Clinical Data: Follow-up pneumonia.  COPD with asthma, hypertension and congestive heart failure  PORTABLE CHEST - 1 VIEW  Comparison: 12/07/2011  Findings: Cardiomegaly is again identified and is unchanged in appearance. The shape of the heart raises the possibility of pericardial effusion although global cardiomegaly can have a similar appearance.  The mediastinal contour demonstrates calcification in the aortic arch and some prominence of the aortic contour.  This is unchanged in comparison with prior exam.  Some improvement in aeration is noted at the right base compatible with improving right lower lobe pneumonia.  Persistent density at the left lung base may reflect atelectasis with the possibility of left lower lobe infiltrate not excluded.  No definite pleural fluid is seen.  No new infiltrates are identified and no signs of congestive failure are suggested  IMPRESSION: Persistent unchanged cardiomegaly with cardiac configuration possibly due to global cardiac enlargement or pericardial effusion.  Improving right lower lobe infiltrate with persistent left lower lobe infiltrate/atelectasis.  Original Report Authenticated By: Bertha Stakes, M.D.    Medications:  Scheduled:   . albuterol  2.5 mg Nebulization QID  . ALPRAZolam  0.375 mg Oral QHS  . antiseptic oral rinse  15 mL Mouth Rinse BID  . aspirin EC  81 mg Oral Daily  . calcium-vitamin D  1 tablet Oral Daily  . cefTRIAXone (ROCEPHIN)  IV  1 g Intravenous Q24H  . digoxin  125 mcg Oral Daily  . diltiazem  120 mg Oral Daily  . enoxaparin (LOVENOX) injection  40 mg  Subcutaneous Q24H  . furosemide  40 mg Oral Daily  . gabapentin  600 mg Oral QID  . ipratropium  0.5 mg Nebulization QID  . levofloxacin  500 mg Oral Daily  . levothyroxine  50 mcg Oral QAC breakfast  . lisinopril  20 mg Oral Daily  . omega-3 acid ethyl esters  1 g Oral Daily  . PARoxetine  40 mg Oral Daily  . polyethylene glycol  17 g Oral Daily  . potassium chloride  10 mEq Oral Daily  . senna  3 tablet Oral QHS  . sodium chloride  3 mL Intravenous Q12H  . sodium chloride      . Tamsulosin HCl  0.4 mg Oral QHS  . vancomycin  1,000 mg Intravenous Q24H  . vitamin C  500 mg Oral Daily  . vitamin E  400 Units Oral Daily   Continuous:  NWG:NFAOZH chloride, acetaminophen, RESOURCE THICKENUP CLEAR, sodium chloride  Assesment: He has healthcare associated pneumonia.  This is probably from aspiration. This is related to his chronic dementia. Principal Problem:  *Healthcare-associated pneumonia Active Problems:  Aspiration pneumonia  Dementia    Plan: He is being treated with antibiotics which I think is appropriate. He will continue with all his other treatments and medications. He will be discharged to an assisted living facility when he is ready to go. I will plan to sign off at this point. Thank you for allowing me to see him with you    LOS: 5 days   [name] 12/13/2011, 7:41 AM

## 2011-12-14 MED ORDER — VANCOMYCIN HCL 1000 MG IV SOLR
750.0000 mg | INTRAVENOUS | Status: DC
  Administered 2011-12-15: 750 mg via INTRAVENOUS
  Filled 2011-12-14 (×2): qty 750

## 2011-12-14 NOTE — Progress Notes (Signed)
Was called into room by nursing staff.  Pt oxygen was 86% room air. Telemetry was reading ventricular bigeminy with short runs of V-tach.  Pt resting in bed. No oxygen was on. Placed O2 on 3L/[place] sats ranging 90-94%  Pt has no complaints. Will continue to monitor.  Paging MD to make aware.

## 2011-12-14 NOTE — Consult Note (Addendum)
ANTIBIOTIC CONSULT NOTE   Pharmacy Consult for Vancomycin Indication: pneumonia  Allergies  Allergen Reactions  . Codeine     Reaction:unknown  . Penicillins Other (See Comments)    Unknown reactions   . Sulfa Antibiotics Other (See Comments)    Unknown reaction   Patient Measurements: Height: 5\' 9"  (175.3 cm) Weight: 189 lb 13.1 oz (86.1 kg) IBW/kg (Calculated) : 70.7  Adjusted Body Weight: 82Kg (last recorded weight)  Vital Signs: Temp: 98.3 F (36.8 C) (07/28 0526) Temp src: Oral (07/28 0526) BP: 118/63 mmHg (07/28 0526) Pulse Rate: 67  (07/28 0526) Intake/Output from previous day: 07/27 0701 - 07/28 0700 In: 673 [P.O.:420; I.V.:3; IV Piggyback:250] Out: 476 [Urine:475; Stool:1] Intake/Output from this shift:    Labs:  Basename 12/13/11 0542 12/12/11 0455  WBC 8.6 --  HGB 10.4* --  PLT 189 --  LABCREA -- --  CREATININE -- 1.57*   Estimated Creatinine Clearance: 33.3 ml/min (by C-G formula based on Cr of 1.57).  Basename 12/11/11 1054  VANCOTROUGH 21.4*  VANCOPEAK --  Drue Dun --  GENTTROUGH --  GENTPEAK --  GENTRANDOM --  TOBRATROUGH --  TOBRAPEAK --  TOBRARND --  AMIKACINPEAK --  AMIKACINTROU --  AMIKACIN --     Microbiology: Recent Results (from the past 720 hour(s))  CULTURE, BLOOD (ROUTINE X 2)     Status: Normal   Collection Time   11/20/11  5:34 PM      Component Value Range Status Comment   Specimen Description BLOOD LEFT ANTECUBITAL   Final    Special Requests BOTTLES DRAWN AEROBIC AND ANAEROBIC 4CC   Final    Culture NO GROWTH 5 DAYS   Final    Report Status 11/25/2011 FINAL   Final   CULTURE, BLOOD (ROUTINE X 2)     Status: Normal   Collection Time   11/20/11  5:35 PM      Component Value Range Status Comment   Specimen Description BLOOD RIGHT ARM   Final    Special Requests BOTTLES DRAWN AEROBIC ONLY 4CC   Final    Culture NO GROWTH 5 DAYS   Final    Report Status 11/25/2011 FINAL   Final   URINE CULTURE     Status: Normal   Collection Time   11/20/11  7:10 PM      Component Value Range Status Comment   Specimen Description URINE, CATHETERIZED   Final    Special Requests NONE   Final    Culture  Setup Time 11/21/2011 21:23   Final    Colony Count NO GROWTH   Final    Culture NO GROWTH   Final    Report Status 11/22/2011 FINAL   Final   MRSA PCR SCREENING     Status: Normal   Collection Time   11/20/11 10:49 PM      Component Value Range Status Comment   MRSA by PCR NEGATIVE  NEGATIVE Final   URINE CULTURE     Status: Normal   Collection Time   11/23/11  1:40 PM      Component Value Range Status Comment   Specimen Description URINE, CLEAN CATCH   Final    Special Requests NONE   Final    Culture  Setup Time 11/23/2011 19:39   Final    Colony Count 15,000 COLONIES/ML   Final    Culture     Final    Value: Multiple bacterial morphotypes present, none predominant. Suggest appropriate recollection if clinically indicated.  Report Status 11/25/2011 FINAL   Final   URINE CULTURE     Status: Normal   Collection Time   12/07/11  7:18 PM      Component Value Range Status Comment   Specimen Description URINE, CLEAN CATCH   Final    Special Requests NONE   Final    Culture  Setup Time 12/08/2011 11:15   Final    Colony Count >=100,000 COLONIES/ML   Final    Culture PROTEUS MIRABILIS   Final    Report Status 12/11/2011 FINAL   Final    Organism ID, Bacteria PROTEUS MIRABILIS   Final   CULTURE, BLOOD (ROUTINE X 2)     Status: Normal   Collection Time   12/07/11  8:27 PM      Component Value Range Status Comment   Specimen Description BLOOD RIGHT ANTECUBITAL   Final    Special Requests BOTTLES DRAWN AEROBIC AND ANAEROBIC 8CC   Final    Culture NO GROWTH 5 DAYS   Final    Report Status 12/13/2011 FINAL   Final   CULTURE, BLOOD (ROUTINE X 2)     Status: Normal   Collection Time   12/07/11  8:45 PM      Component Value Range Status Comment   Specimen Description BLOOD RIGHT HAND   Final    Special Requests BOTTLES  DRAWN AEROBIC ONLY 7CC   Final    Culture NO GROWTH 5 DAYS   Final    Report Status 12/13/2011 FINAL   Final   MRSA PCR SCREENING     Status: Normal   Collection Time   12/08/11  2:26 AM      Component Value Range Status Comment   MRSA by PCR NEGATIVE  NEGATIVE Final    Medical History: Past Medical History  Diagnosis Date  . COPD (chronic obstructive pulmonary disease)   . Shortness of breath   . Asthma   . DEMENTIA     short term  . Hypertension   . Arthritis   . Hypothyroidism   . CHF (congestive heart failure)   . BPH (benign prostatic hyperplasia)   . Hiatal hernia   . Neuropathy    Medications:  Scheduled:     . albuterol  2.5 mg Nebulization QID  . ALPRAZolam  0.375 mg Oral QHS  . antiseptic oral rinse  15 mL Mouth Rinse BID  . aspirin EC  81 mg Oral Daily  . calcium-vitamin D  1 tablet Oral Daily  . cefTRIAXone (ROCEPHIN)  IV  1 g Intravenous Q24H  . digoxin  125 mcg Oral Daily  . diltiazem  120 mg Oral Daily  . enoxaparin (LOVENOX) injection  40 mg Subcutaneous Q24H  . furosemide  40 mg Oral Daily  . gabapentin  600 mg Oral QID  . ipratropium  0.5 mg Nebulization QID  . levofloxacin  500 mg Oral Daily  . levothyroxine  50 mcg Oral QAC breakfast  . lisinopril  20 mg Oral Daily  . omega-3 acid ethyl esters  1 g Oral Daily  . PARoxetine  40 mg Oral Daily  . polyethylene glycol  17 g Oral Daily  . potassium chloride  10 mEq Oral Daily  . senna  3 tablet Oral QHS  . sodium chloride  3 mL Intravenous Q12H  . sodium chloride      . Tamsulosin HCl  0.4 mg Oral QHS  . vancomycin  1,000 mg Intravenous Q24H  . vitamin C  500 mg Oral Daily  . vitamin E  400 Units Oral Daily   Assessment: 78 yo male with recurrent PNA on day #7 Vancomycin and Levaquin for HCAP vs aspiration PNA.  He is also on day #3 Rocephin for proteus UTI.   Renal function has declined since admission- vancomycin dose adjusted on 7/25.  Goal of Therapy:  Vancomycin trough level 15-20  mcg/ml  Plan: 1) Continue Vancomycin 1000mg  iv q24hrs 2) Repeat Vancomycin trough level with next dose.  3) Monitor renal fxn 4) Length of therapy per MD. Pt has completed 7 days of Levaquin- consider d/c since patient now also on Rocephin.   [name] 12/14/2011,9:14 AM  Vancomycin trough level still slightly >goal range with declining renal function. Decrease subsequent doses of vancomycin to 750mg  IV Q24h.  Continue to monitor renal function and recheck trough per protocol.  Junita Push, PharmD, BCPS 12/14/2011@4 :36 PM

## 2011-12-14 NOTE — Progress Notes (Signed)
Dr. Megan Mans made aware of telemetry readings from earlier. No new orders.

## 2011-12-14 NOTE — Progress Notes (Signed)
Paged MD twice this evening. No response. Pt resting in bed. Family visiting at bedside. No complaints. Telemetry reads Afib rate 52. Will continue to monitor.

## 2011-12-14 NOTE — Progress Notes (Signed)
Subjective: Patient feels better. His breathing is improving. No cough or shortness of breath.  However, he remained weak and disconditioned.  Objective: Vital signs in last 24 hours: Temp:  [98.3 F (36.8 C)-98.5 F (36.9 C)] 98.3 F (36.8 C) (07/28 0526) Pulse Rate:  [61-73] 67  (07/28 0526) Resp:  [18-19] 18  (07/28 0526) BP: (116-119)/(62-65) 118/63 mmHg (07/28 0526) SpO2:  [90 %-99 %] 99 % (07/28 1145) Weight:  [86.1 kg (189 lb 13.1 oz)] 86.1 kg (189 lb 13.1 oz) (07/28 0420) Weight change: 0.053 kg (1.9 oz) Last BM Date: 12/13/11  Intake/Output from previous day: 07/27 0701 - 07/28 0700 In: 673 [P.O.:420; I.V.:3; IV Piggyback:250] Out: 476 [Urine:475; Stool:1]  PHYSICAL EXAM General appearance: alert and no distress Resp: diminished breath sounds bilaterally and rhonchi bilaterally Cardio: S1, S2 normal GI: soft, non-tender; bowel sounds normal; no masses,  no organomegaly Extremities: extremities normal, atraumatic, no cyanosis or edema  Lab Results:    @labtest @ ABGS No results found for this basename: PHART,PCO2,PO2ART,TCO2,HCO3 in the last 72 hours CULTURES Recent Results (from the past 240 hour(s))  URINE CULTURE     Status: Normal   Collection Time   12/07/11  7:18 PM      Component Value Range Status Comment   Specimen Description URINE, CLEAN CATCH   Final    Special Requests NONE   Final    Culture  Setup Time 12/08/2011 11:15   Final    Colony Count >=100,000 COLONIES/ML   Final    Culture PROTEUS MIRABILIS   Final    Report Status 12/11/2011 FINAL   Final    Organism ID, Bacteria PROTEUS MIRABILIS   Final   CULTURE, BLOOD (ROUTINE X 2)     Status: Normal   Collection Time   12/07/11  8:27 PM      Component Value Range Status Comment   Specimen Description BLOOD RIGHT ANTECUBITAL   Final    Special Requests BOTTLES DRAWN AEROBIC AND ANAEROBIC 8CC   Final    Culture NO GROWTH 5 DAYS   Final    Report Status 12/13/2011 FINAL   Final   CULTURE, BLOOD  (ROUTINE X 2)     Status: Normal   Collection Time   12/07/11  8:45 PM      Component Value Range Status Comment   Specimen Description BLOOD RIGHT HAND   Final    Special Requests BOTTLES DRAWN AEROBIC ONLY 7CC   Final    Culture NO GROWTH 5 DAYS   Final    Report Status 12/13/2011 FINAL   Final   MRSA PCR SCREENING     Status: Normal   Collection Time   12/08/11  2:26 AM      Component Value Range Status Comment   MRSA by PCR NEGATIVE  NEGATIVE Final    Studies/Results: No results found.  Medications: I have reviewed the patient's current medications.  Assesment: Principal Problem:  *Healthcare-associated pneumonia Active Problems:  Aspiration pneumonia  Dementia    Plan: Continue Iv antibiotics. Continue to ambulate. Continue regular medications.    LOS: 6 days   [name] 12/14/2011, 12:39 PM

## 2011-12-15 LAB — BASIC METABOLIC PANEL
BUN: 49 mg/dL — ABNORMAL HIGH (ref 6–23)
Chloride: 100 mEq/L (ref 96–112)
GFR calc Af Amer: 39 mL/min — ABNORMAL LOW (ref >90)
Potassium: 5.1 mEq/L (ref 3.5–5.1)

## 2011-12-15 NOTE — Clinical Social Work Note (Signed)
Call from Toniann Fail, Charity fundraiser at [facility] [phone]).  Wants to come and do evaluation on patient before accepting into their facility to be sure they can meet his needs.    Clovis Cao Clinical Social Worker [phone])

## 2011-12-15 NOTE — Evaluation (Signed)
Occupational Therapy Evaluation Patient Details Name: [name] MRN: [id number] DOB: [birth year] Today's Date: 12/15/2011 Time: 4098-1191 OT Time Calculation (min): 33 min  OT Assessment / Plan / Recommendation Clinical Impression  Patient is a 78 y/o s/p Pneumonia presenting to acute OT with the deficits below. Patient will benefit from acute OT services to increase functional performance during ADL and functional transfers. Patient will benefit from SNF placement to increase independence and strength.     OT Assessment  Patient needs continued OT Services    Follow Up Recommendations  Skilled nursing facility    Barriers to Discharge Decreased caregiver support    Equipment Recommendations  Defer to next venue       Frequency  Min 2X/week    Precautions / Restrictions Precautions Precautions: Fall   Pertinent Vitals/Pain No reports of pain.    ADL  Grooming: Simulated;Maximal assistance (due to arthritis in shoulders and tremors.) Where Assessed - Grooming: Supported sitting Upper Body Bathing: Simulated;Maximal assistance Where Assessed - Upper Body Bathing: Supported sitting Lower Body Bathing: Simulated;+1 Total assistance Where Assessed - Lower Body Bathing: Supported sit to stand Upper Body Dressing: Performed;Minimal assistance Where Assessed - Upper Body Dressing: Supported sitting Lower Body Dressing: Performed;+1 Total assistance Where Assessed - Lower Body Dressing: Supported sit to Pharmacist, hospital: Performed;Maximal Dentist Method: Stand pivot Toileting - Clothing Manipulation and Hygiene: Performed;+1 Total assistance Where Assessed - Engineer, mining and Hygiene: Standing Equipment Used: Gait belt Transfers/Ambulation Related to ADLs: Sit to stand was Max Assist with RW. Once standing patient was a Risk analyst for transfer. patient required max vc's for direction during transfer. Patient was confused and asked  several times where he was going. ADL Comments: Pt did not attempt to perform LB bathing or dressing. Pt stated that he couldn't bend over.    OT Diagnosis: Generalized weakness  OT Problem List: Decreased strength;Decreased range of motion;Decreased activity tolerance;Impaired balance (sitting and/or standing);Decreased knowledge of use of DME or AE;Decreased safety awareness OT Treatment Interventions: Self-care/ADL training;Therapeutic activities;Therapeutic exercise;Energy conservation;DME and/or AE instruction;Balance training;Patient/family education   OT Goals Acute Rehab OT Goals OT Goal Formulation: With patient Time For Goal Achievement: 12/29/11 Potential to Achieve Goals: Good ADL Goals Pt Will Perform Upper Body Bathing: with min assist;Sitting, edge of bed;Supported ADL Goal: Upper Body Bathing - Progress: Goal set today Pt Will Perform Lower Body Bathing: with mod assist;Sit to stand from bed;Supported ADL Goal: Lower Body Bathing - Progress: Goal set today Pt Will Perform Upper Body Dressing: with supervision;Sit to stand from bed;Supported ADL Goal: Upper Body Dressing - Progress: Goal set today Pt Will Perform Lower Body Dressing: with mod assist;Sit to stand from bed;Standing ADL Goal: Lower Body Dressing - Progress: Goal set today Pt Will Transfer to Toilet: with mod assist;3-in-1;with cueing (comment type and amount);Ambulation (min cueing for technique) ADL Goal: Toilet Transfer - Progress: Goal set today Pt Will Perform Toileting - Clothing Manipulation: with mod assist;Standing ADL Goal: Toileting - Clothing Manipulation - Progress: Goal set today Pt Will Perform Toileting - Hygiene: with mod assist;Sit to stand from 3-in-1/toilet ADL Goal: Toileting - Hygiene - Progress: Goal set today Arm Goals Pt Will Complete Theraband Exer: with supervision, verbal cues required/provided;Bilateral upper extremities;1 set;5 reps;Level 1 Theraband Arm Goal: Theraband Exercises  - Progress: Goal set today  Visit Information  Last OT Received On: 12/15/11 Assistance Needed: +1    Subjective Data  Subjective: "I am sleepy." Patient Stated Goal: To go home.  Prior Functioning  Vision/Perception  Home Living Available Help at Discharge: Personal care attendant (available 12 hours a week) Type of Home: Assisted living Home Access: Level entry Home Layout: One level Home Adaptive Equipment: Walker - rolling;Wheelchair - manual Prior Function Level of Independence: Needs assistance Needs Assistance: Meal Prep;Light Housekeeping;Bathing Able to Take Stairs?: No Driving: No   Vision - Assessment Eye Alignment: Within Functional Limits  Cognition  Overall Cognitive Status: History of cognitive impairments - at baseline Arousal/Alertness: Other (comment) (sleeping at start of eval. Patient very drowsy.) Orientation Level: Person;Place Behavior During Session: [facility] for tasks performed Cognition - Other Comments: Patient required max cues for direction during transfer. patient asked several times where was going.    Extremity/Trunk Assessment Right Upper Extremity Assessment RUE ROM/Strength/Tone: Deficits RUE ROM/Strength/Tone Deficits: A/ROM: less than half range in shoulder and elbow ranges. MMT: 3-/5 Left Upper Extremity Assessment LUE ROM/Strength/Tone: Deficits LUE ROM/Strength/Tone Deficits: A/ROM: less than half range in shoulder and elbow ranges. MMT: 3-/5   Mobility Bed Mobility Bed Mobility: Sit to Supine  Sit to Supine: 2: Max assist;HOB flat;With rail Transfers Transfers: Sit to Stand;Stand to Sit Sit to Stand: 2: Max assist;With armrests;From chair/3-in-1;With upper extremity assist Stand to Sit: To bed;Without upper extremity assist;3: Mod assist Details for Transfer Assistance: Pt is so fatigued that his knees buckled and he had to be eased into chair by therapist      Balance Balance Balance Assessed: No  End of Session OT - End of  Session Equipment Utilized During Treatment: Gait belt Activity Tolerance: Patient limited by fatigue Patient left: in bed;with call bell/phone within reach    [facility], OTR/L  12/15/2011, 2:16 PM

## 2011-12-15 NOTE — Evaluation (Signed)
Physical Therapy Evaluation Patient Details Name: [name] MRN: [id number] DOB: 04/15/1918 Today's Date: 12/15/2011 Time: 4098-1191 PT Time Calculation (min): 44 min  PT Assessment / Plan / Recommendation Clinical Impression  Unforturnate;y, pt this re-assess has found pt to be in a decline with decreased strength, transfer ability and gait.  I had been misinformed as to his prior functional status and apparently, pt had been much more ambulatory than had been previously thought.  Because of this, I am now recommending SNF at d/c.  Additionally, pt is able to maintain 93% O2 sat on RA, but with exertion his O2 sat declines to 85%.  He may need O2 for exertional activities.         PT Assessment  Patient needs continued PT services    Follow Up Recommendations  Skilled nursing facility    Barriers to Discharge None      Equipment Recommendations  Defer to next venue    Recommendations for Other Services     Frequency Min 3X/week    Precautions / Restrictions     Pertinent Vitals/Pain       Mobility  Bed Mobility Bed Mobility: Supine to Sit Supine to Sit: HOB elevated;4: Min assist Sit to Supine: Not Tested (comment) Transfers Sit to Stand: 3: Mod assist;With upper extremity assist;From bed Stand to Sit: To chair/3-in-1;Without upper extremity assist;1: +1 Total assist Details for Transfer Assistance: Pt is so fatigued that his knees buckled and he had to be eased into chair by therapist Ambulation/Gait Ambulation/Gait Assistance: 2: Max assist Ambulation Distance (Feet): 2 Feet Assistive device: Rolling walker Gait Pattern: Trunk flexed;Shuffle General Gait Details: only able to transfer bed to chair with walker Stairs: No Wheelchair Mobility Wheelchair Mobility: No    Exercises General Exercises - Upper Extremity Shoulder Flexion: AAROM;Both;5 reps;Supine Shoulder ABduction: AAROM;Both;5 reps;Supine Elbow Flexion: Strengthening;Both;5 reps;Supine Elbow  Extension: Strengthening;Both;5 reps;Supine General Exercises - Lower Extremity Ankle Circles/Pumps: Strengthening;AROM;Both;10 reps;Supine Quad Sets: AROM;Both;10 reps;Supine Long Arc Quad: Both;10 reps;Supine;AAROM Heel Slides: AAROM;Both;10 reps;Supine Hip ABduction/ADduction: AAROM;Both;10 reps;Supine Hip Flexion/Marching: AROM;Both;10 reps;Seated   PT Diagnosis: Difficulty walking;Generalized weakness  PT Problem List: Decreased strength;Decreased mobility;Decreased activity tolerance PT Treatment Interventions: Gait training;Functional mobility training;Therapeutic activities;Therapeutic exercise;Patient/family education   PT Goals Acute Rehab PT Goals PT Goal Formulation: With patient/family Time For Goal Achievement: 12/22/11 Potential to Achieve Goals: Good Pt will go Supine/Side to Sit: with supervision PT Goal: Supine/Side to Sit - Progress: Goal set today Pt will go Sit to Supine/Side: with min assist;with HOB not 0 degrees (comment degree) PT Goal: Sit to Supine/Side - Progress: Goal set today Pt will go Sit to Stand: with supervision;with upper extremity assist PT Goal: Sit to Stand - Progress: Goal set today Pt will go Stand to Sit: with mod assist;with upper extremity assist PT Goal: Stand to Sit - Progress: Goal set today Pt will Ambulate: 1 - 15 feet;with mod assist;with rolling walker PT Goal: Ambulate - Progress: Goal set today  Visit Information  Last PT Received On: 12/15/11    Subjective Data  Subjective: Pt's son concerned that pt is too weak to return to [facility]...states that pt was able to ambulate 1600'  3-4 weeks ago Patient Stated Goal: wants to get stronger   Prior Functioning       Cognition  Overall Cognitive Status: History of cognitive impairments - at baseline Arousal/Alertness: Awake/alert Orientation Level: Appears intact for tasks assessed Behavior During Session: [facility] for tasks performed    Extremity/Trunk Assessment Right Lower  Extremity  Assessment RLE ROM/Strength/Tone: Deficits RLE ROM/Strength/Tone Deficits: generally 3-/5 RLE Sensation: WFL - Light Touch RLE Coordination: WFL - gross motor Left Lower Extremity Assessment LLE ROM/Strength/Tone: Deficits LLE ROM/Strength/Tone Deficits: generally 3-/5 LLE Sensation: WFL - Light Touch LLE Coordination: WFL - gross motor   Balance Balance Balance Assessed: No  End of Session PT - End of Session Equipment Utilized During Treatment: Gait belt Activity Tolerance: Patient limited by fatigue Patient left: in chair;with call bell/phone within reach;with chair alarm set;with family/visitor present Nurse Communication: Mobility status  GP     Konrad Penta 12/15/2011, 11:58 AM

## 2011-12-15 NOTE — Progress Notes (Signed)
INITIAL ADULT NUTRITION ASSESSMENT Date: 12/15/2011   Time: 5:16 PM Reason for Assessment: Nutrition Risk  ASSESSMENT: Male 78 y.o.  Dx: Healthcare-associated pneumonia   Past Medical History  Diagnosis Date  . COPD (chronic obstructive pulmonary disease)   . Shortness of breath   . Asthma   . DEMENTIA     short term  . Hypertension   . Arthritis   . Hypothyroidism   . CHF (congestive heart failure)   . BPH (benign prostatic hyperplasia)   . Hiatal hernia   . Neuropathy     Scheduled Meds:   . albuterol  2.5 mg Nebulization QID  . ALPRAZolam  0.375 mg Oral QHS  . antiseptic oral rinse  15 mL Mouth Rinse BID  . aspirin EC  81 mg Oral Daily  . calcium-vitamin D  1 tablet Oral Daily  . cefTRIAXone (ROCEPHIN)  IV  1 g Intravenous Q24H  . digoxin  125 mcg Oral Daily  . diltiazem  120 mg Oral Daily  . enoxaparin (LOVENOX) injection  40 mg Subcutaneous Q24H  . furosemide  40 mg Oral Daily  . gabapentin  600 mg Oral QID  . ipratropium  0.5 mg Nebulization QID  . levofloxacin  500 mg Oral Daily  . levothyroxine  50 mcg Oral QAC breakfast  . lisinopril  20 mg Oral Daily  . omega-3 acid ethyl esters  1 g Oral Daily  . PARoxetine  40 mg Oral Daily  . polyethylene glycol  17 g Oral Daily  . potassium chloride  10 mEq Oral Daily  . senna  3 tablet Oral QHS  . sodium chloride  3 mL Intravenous Q12H  . Tamsulosin HCl  0.4 mg Oral QHS  . vancomycin  750 mg Intravenous Q24H  . vitamin C  500 mg Oral Daily  . vitamin E  400 Units Oral Daily   Continuous Infusions:  PRN Meds:.sodium chloride, acetaminophen, RESOURCE THICKENUP CLEAR, sodium chloride  Ht: 5\' 9"  (175.3 cm)  Wt: 190 lb 7.6 oz (86.4 kg)  Ideal Wt:  70.7 kg % Ideal Wt: 122%  Usual Wt: 184# on admission 12/09/11 % Usual Wt: 103%  Body mass index is 28.13 kg/(m^2).Overweight  Food/Nutrition Related Hx: PT reports fair appetite. Swallow difficulty noted MBSS 12/09/11. Dys 3 (Mech  Soft) Nectar thick-liquids.  Variable po intake 0-70% meals. Feeds self. At risk for malnutrition given advanced age, chronic dz and inconsistent meal intake.   CMP     Component Value Date/Time   NA 137 12/15/2011 0449   K 5.1 12/15/2011 0449   CL 100 12/15/2011 0449   CO2 31 12/15/2011 0449   GLUCOSE 101* 12/15/2011 0449   BUN 49* 12/15/2011 0449   CREATININE 1.68* 12/15/2011 0449   CALCIUM 10.0 12/15/2011 0449   PROT 6.6 12/07/2011 2013   ALBUMIN 3.3* 12/07/2011 2013   AST 28 12/07/2011 2013   ALT 16 12/07/2011 2013   ALKPHOS 98 12/07/2011 2013   BILITOT 1.1 12/07/2011 2013   GFRNONAA 34* 12/15/2011 0449   GFRAA 39* 12/15/2011 0449    Intake/Output Summary (Last 24 hours) at 12/15/11 1722 Last data filed at 12/15/11 1312  Gross per 24 hour  Intake    713 ml  Output    252 ml  Net    461 ml     Diet Order: Dysphagia  Supplements/Tube Feeding:none at this time  IVF:    Estimated Nutritional Needs:   Kcal:2150-2480 kcal/day Protein:95-105 gr/day Fluid:1 ml/kcal/day  NUTRITION DIAGNOSIS: -Predicted  suboptimal energy intake (NI-1.6).  Status: Ongoing  RELATED TO: swallow difficulty  AS EVIDENCE BY: inconsistent meal intake and pt reports he doesn't like thickened liquids  MONITORING/EVALUATION(Goals): Monitor meal intake and supplements, labs and wt trends  EDUCATION NEEDS: -Education not appropriate at this time  INTERVENTION: Magic cup TID between meals, each supplement provides 290 kcal and 9 grams of protein.  Dietitian [phone]  DOCUMENTATION CODES Per approved criteria  -Not Applicable    Francene Boyers 12/15/2011, 5:16 PM

## 2011-12-15 NOTE — Progress Notes (Signed)
Subjective: Patient feels better. His cough and congestion is better. However, he remained weak and deconditioned. His son is requesting for short term nursing home placement.  Objective: Vital signs in last 24 hours: Temp:  [98.3 F (36.8 C)-98.9 F (37.2 C)] 98.3 F (36.8 C) (07/29 4098) Pulse Rate:  [59-75] 74  (07/29 0633) Resp:  [20] 20  (07/29 0633) BP: (113-119)/(50-68) 113/68 mmHg (07/29 0633) SpO2:  [86 %-100 %] 97 % (07/29 0710) Weight:  [86.4 kg (190 lb 7.6 oz)] 86.4 kg (190 lb 7.6 oz) (07/29 0442) Weight change: 0.3 kg (10.6 oz) Last BM Date: 12/15/11  Intake/Output from previous day: 07/28 0701 - 07/29 0700 In: 413 [P.O.:360; I.V.:3; IV Piggyback:50] Out: 252 [Urine:250; Stool:2]  PHYSICAL EXAM General appearance: alert Resp: diminished breath sounds bilaterally Cardio: S1, S2 normal GI: soft, non-tender; bowel sounds normal; no masses,  no organomegaly Extremities: extremities normal, atraumatic, no cyanosis or edema  Lab Results:    @labtest @ [facility] No results found for this basename: PHART,PCO2,PO2ART,TCO2,HCO3 in the last 72 hours CULTURES Recent Results (from the past 240 hour(s))  URINE CULTURE     Status: Normal   Collection Time   12/07/11  7:18 PM      Component Value Range Status Comment   Specimen Description URINE, CLEAN CATCH   Final    Special Requests NONE   Final    Culture  Setup Time 12/08/2011 11:15   Final    Colony Count >=100,000 COLONIES/ML   Final    Culture PROTEUS MIRABILIS   Final    Report Status 12/11/2011 FINAL   Final    Organism ID, Bacteria PROTEUS MIRABILIS   Final   CULTURE, BLOOD (ROUTINE X 2)     Status: Normal   Collection Time   12/07/11  8:27 PM      Component Value Range Status Comment   Specimen Description BLOOD RIGHT ANTECUBITAL   Final    Special Requests BOTTLES DRAWN AEROBIC AND ANAEROBIC 8CC   Final    Culture NO GROWTH 5 DAYS   Final    Report Status 12/13/2011 FINAL   Final   CULTURE, BLOOD (ROUTINE X  2)     Status: Normal   Collection Time   12/07/11  8:45 PM      Component Value Range Status Comment   Specimen Description BLOOD RIGHT HAND   Final    Special Requests BOTTLES DRAWN AEROBIC ONLY 7CC   Final    Culture NO GROWTH 5 DAYS   Final    Report Status 12/13/2011 FINAL   Final   MRSA PCR SCREENING     Status: Normal   Collection Time   12/08/11  2:26 AM      Component Value Range Status Comment   MRSA by PCR NEGATIVE  NEGATIVE Final    Studies/Results: No results found.  Medications: I have reviewed the patient's current medications.  Assesment: Principal Problem:  *Healthcare-associated pneumonia Active Problems:  Aspiration pneumonia  Dementia    Plan: We will continue Iv antbiiotics. We will do social worker consult for placement to nursing home.    LOS: 7 days   Abdo Denault 12/15/2011, 7:41 AM

## 2011-12-16 ENCOUNTER — Inpatient Hospital Stay
Admission: RE | Admit: 2011-12-16 | Discharge: 2012-01-17 | Payer: Medicaid Other | Source: Ambulatory Visit | Attending: Internal Medicine | Admitting: Internal Medicine

## 2011-12-16 DIAGNOSIS — R05 Cough: Principal | ICD-10-CM

## 2011-12-16 DIAGNOSIS — R0989 Other specified symptoms and signs involving the circulatory and respiratory systems: Secondary | ICD-10-CM

## 2011-12-16 MED ORDER — LEVOFLOXACIN 500 MG PO TABS: 500.0000 mg | ORAL_TABLET | Freq: Every day | ORAL | Status: AC

## 2011-12-16 NOTE — Progress Notes (Signed)
Patient is eligible for [facility] community care management services.  Referral made to Larey Brick RN [phone].  They will follow up with the patient and family at [facility]. For any additional questions or new referrals please contact Anibal Henderson BSN RN [facility] Liaison at [phone].

## 2011-12-16 NOTE — Clinical Social Work Placement (Signed)
Clinical Social Work Department CLINICAL SOCIAL WORK PLACEMENT NOTE 12/16/2011  Patient:  [name], [name]  Account Number:  [id number] Admit date:  12/08/2011  Clinical Social Worker:  Santa Genera, CLINICAL SOCIAL WORKER  Date/time:  12/08/2011 10:30 AM  Clinical Social Work is seeking post-discharge placement for this patient at the following level of care:   SKILLED NURSING   (*CSW will update this form in Epic as items are completed)   12/15/2011  Patient/family provided with [facility] Department of Clinical Social Work's list of facilities offering this level of care within the geographic area requested by the patient (or if unable, by the patient's family).  12/15/2011  Patient/family informed of their freedom to choose among providers that offer the needed level of care, that participate in Medicare, Medicaid or managed care program needed by the patient, have an available bed and are willing to accept the patient.  12/15/2011  Patient/family informed of [facility]' ownership interest in [facility], as well as of the fact that they are under no obligation to receive care at this facility.  PASARR submitted to EDS on 12/15/2011 PASARR number received from EDS on 12/15/2011  FL2 transmitted to all facilities in geographic area requested by pt/family on  12/15/2011 FL2 transmitted to all facilities within larger geographic area on   Patient informed that his/her managed care company has contracts with or will negotiate with  certain facilities, including the following:     Patient/family informed of bed offers received:  12/12/2011 Patient chooses bed at [facility] Physician recommends and patient chooses bed at  [facility]  Patient to be transferred to [facility] on  12/16/2011 Patient to be transferred to facility by RN  The following physician request were entered in Epic:   Additional Comments: [facility] will not accept  patient at discharge w thick liquid diet.  Family requests transfer to [facility] and to be placed on waiting list for [facility].  Family encouraged to call business office at ALF to verify that Medicaid will cover patient placement at facility. Pt and family accept bed offer at [facility] on 12/12/11.  PT reevaluated patient today and recommended rehab at SNF level.  Recommendation relayed to POA Andy Gauss.  Mr Aker requested that we seek bed offers from [facility], [place] and [facility].  Have faxed out his information and requested that afternoon CSW follow up w facility and family.  Derenda Fennel, [place] [phone]

## 2011-12-16 NOTE — Clinical Social Work Note (Signed)
Bed offers received from [facility], [facility], [facility] and [facility].  [name] is considering.  Presented bed offers to son, who chose [facility].  Son asked to sign paperwork at facility and son understands patient to be transported via tunnel by [facility] staff.  FL2 updated and faxed to facility via [facility].  Discharge packet will be prepared and placed for transport.    Clovis Cao Clinical Social Worker [phone])

## 2011-12-16 NOTE — Discharge Planning (Signed)
Physician Discharge Summary  Patient ID: [name] MRN: [id number] DOB/AGE: 1917-07-24 78 y.o. Primary Care Physician:Eshaal Duby, MD Admit date: 12/08/2011 Discharge date: 12/16/2011    Discharge Diagnoses:  Principal Problem:  *Healthcare-associated pneumonia Active Problems:  Aspiration pneumonia  Dementia   Medication List  As of 12/16/2011 12:09 PM   TAKE these medications         ADVIL PM 200-25 MG Caps   Generic drug: Ibuprofen-Diphenhydramine HCl   Take 2 tablets by mouth at bedtime. For pain and sleep      albuterol (2.5 MG/3ML) 0.083% nebulizer solution   Commonly known as: PROVENTIL   Take 2.5 mg by nebulization 4 (four) times daily.      ALPRAZolam 0.25 MG tablet   Commonly known as: XANAX   Take 0.375 mg by mouth at bedtime. Take one and one-half of a tablet at bedtime      aspirin EC 81 MG tablet   Take 81 mg by mouth daily.      CALCIUM 600/VITAMIN D PO   Take 1 tablet by mouth daily.      digoxin 0.125 MG tablet   Commonly known as: LANOXIN   Take 125 mcg by mouth daily.      fish oil-omega-3 fatty acids 1000 MG capsule   Take 1 g by mouth daily.      furosemide 40 MG tablet   Commonly known as: LASIX   Take 40 mg by mouth daily.      gabapentin 600 MG tablet   Commonly known as: NEURONTIN   Take 600 mg by mouth 4 (four) times daily.      ipratropium 0.02 % nebulizer solution   Commonly known as: ATROVENT   Take 500 mcg by nebulization 4 (four) times daily.      Lecithin 1200 MG Caps   Take 1,200 mg by mouth daily.      levofloxacin 500 MG tablet   Commonly known as: LEVAQUIN   Take 1 tablet (500 mg total) by mouth daily.      levothyroxine 50 MCG tablet   Commonly known as: SYNTHROID, LEVOTHROID   Take 50 mcg by mouth daily before breakfast.      PARoxetine 40 MG tablet   Commonly known as: PAXIL   Take 40 mg by mouth at bedtime.      polyethylene glycol packet   Commonly known as: MIRALAX / GLYCOLAX   Take 1 g by mouth  daily. **MIX ONE CAPFUL IN 8 OUNCES OF WATER/JUICE THEN DRINK DAILY**      potassium chloride 10 MEQ tablet   Commonly known as: K-DUR   Take 10 mEq by mouth daily.      quinapril 20 MG tablet   Commonly known as: ACCUPRIL   Take 20 mg by mouth daily.      senna 8.6 MG Tabs   Commonly known as: SENOKOT   Take 3 tablets by mouth at bedtime.      Tamsulosin HCl 0.4 MG Caps   Commonly known as: FLOMAX   Take 0.4 mg by mouth at bedtime.      TAZTIA XT 120 MG 24 hr capsule   Generic drug: diltiazem   Take 120 mg by mouth daily.      vitamin C 500 MG tablet   Commonly known as: ASCORBIC ACID   Take 1,000 mg by mouth daily.      vitamin E 400 UNIT capsule   Generic drug: vitamin E   Take 400 Units  by mouth daily.            Discharged Condition: Stable   Consults Pulmonary  Significant Diagnostic Studies: Ct Head Wo Contrast  11/20/2011  *RADIOLOGY REPORT*  Clinical Data: Weakness and dementia.  Shortness of breath.  CT HEAD WITHOUT CONTRAST  Technique:  Contiguous axial images were obtained from the base of the skull through the vertex without contrast.  Comparison: CT head without contrast 08/07/2011.  Findings: A remote lacunar infarct of the right basal ganglia is stable.  Atrophy and moderate white matter disease is not significantly changed.  No acute cortical infarct, hemorrhage, mass lesion is present.  The ventricles are proportionate to the degree of atrophy.  No significant extra-axial fluid collection is present  The paranasal sinuses and mastoid air cells are clear.  The osseous skull is intact.  Atherosclerotic calcifications are again seen within the cavernous carotid arteries.  IMPRESSION:  1.  Stable atrophy white matter disease. 2.  Stable remote lacunar infarct of the right basal ganglia. 3.  Atherosclerosis 4.  No acute intracranial abnormality or significant interval change.  Original Report Authenticated By: Jamesetta Orleans. MATTERN, M.D.   Dg Swallowing  Function  12/09/2011  *RADIOLOGY REPORT*  Clinical Data: Dysphagia, multiple recent hospital admissions, COPD, asthma, dementia, hypertension, CHF  SWALLOWING FUNCTION STUDY:  Technique:  Patient swallowed varying consistencies of barium with the assistance of a speech pathologist.  I performed real-time fluoroscopic assessment in the lateral projection.  Fluoroscopy time:  3.5 minutes  Comparison:  None  Findings: With applesauce consistency, normal motion is seen without laryngeal penetration or aspiration.  Vallecular residual noted.  With thin barium by teaspoon, spillover of contrast to the piriform sinus occurred.  Laryngeal penetration identified without aspiration or significant residuals.  Thin barium by cup demonstrated delayed initiation, spillover of contrast to the piriform sinuses with laryngeal penetration and aspiration to just below the vocal cords.  Minimal vallecular residual.  Repeat swallows of thin barium by straw demonstrated laryngeal penetration and aspiration.  No spontaneous cough reflex identified.  With nectar consistency by cup, delayed initiation of swallow was seen with spillover of contrast to the piriform sinuses.  Minimal laryngeal penetration on 1/3 swallows without aspiration or significant residuals.  With nectar consistency, the patient swallowed a 12.5 mm diameter barium tablet.  The pill transiently lodged in the vallecula but cleared with a repeat swallow.  Laryngeal penetration and aspiration of the liquid phase contrast were identified. Vallecular and piriform sinus residuals were noted which cleared spontaneously with a second swallow.  IMPRESSION: Swallowing dysfunction as above.  Original Report Authenticated By: Lollie Marrow, M.D.   Dg Chest Port 1 View  12/12/2011  *RADIOLOGY REPORT*  Clinical Data: Follow-up pneumonia.  COPD with asthma, hypertension and congestive heart failure  PORTABLE CHEST - 1 VIEW  Comparison: 12/07/2011  Findings: Cardiomegaly is again  identified and is unchanged in appearance. The shape of the heart raises the possibility of pericardial effusion although global cardiomegaly can have a similar appearance.  The mediastinal contour demonstrates calcification in the aortic arch and some prominence of the aortic contour.  This is unchanged in comparison with prior exam.  Some improvement in aeration is noted at the right base compatible with improving right lower lobe pneumonia.  Persistent density at the left lung base may reflect atelectasis with the possibility of left lower lobe infiltrate not excluded.  No definite pleural fluid is seen.  No new infiltrates are identified and no signs of congestive  failure are suggested  IMPRESSION: Persistent unchanged cardiomegaly with cardiac configuration possibly due to global cardiac enlargement or pericardial effusion.  Improving right lower lobe infiltrate with persistent left lower lobe infiltrate/atelectasis.  Original Report Authenticated By: Bertha Stakes, M.D.   Dg Chest Port 1 View  12/09/2011  *RADIOLOGY REPORT*  Clinical Data: Shortness of breath.  Fever.  PORTABLE CHEST - 1 VIEW  Comparison: CXR 11/23/11.  Findings: Lung volumes are normal.  The irregular interstitial and airspace opacity in the right lower lobe, concerning for developing pneumonia or sequelae of aspiration.  Retrocardiac left lower lobe is poorly visualized, which could suggest atelectasis or an additional area of airspace consolidation.  No definite pleural effusions.  Pulmonary vasculature is normal.  Moderate enlargement of the cardiopericardial silhouette which has a "water bottle" appearance, which could suggests cardiomegaly and/or the presence of a pericardial effusion. The patient is rotated to the right on today's exam, resulting in distortion of the mediastinal contours and reduced diagnostic sensitivity and specificity for mediastinal pathology.  Atherosclerosis in the thoracic aorta. The aortic arch appears  dilated, potentially 5 cm in diameter.  Old post-traumatic deformity of the anterolateral aspect of the right third and fourth ribs is noted.  IMPRESSION: 1.  Interstitial air space disease in the right lower lobe, concerning for pneumonia or sequela of aspiration. 2.  Atelectasis and/or consolidation in the left lower lobe as well. 3.  Moderate enlargement of the cardiopericardial silhouette which could to be secondary to underlying cardiomegaly and/or the presence of a pericardial effusion. 4.  Atherosclerosis.  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  This could be better evaluated with a contrast enhanced CT scan if clinically indicated.  Original Report Authenticated By: Florencia Reasons, M.D.   Dg Chest Port 1 View  11/23/2011  *RADIOLOGY REPORT*  Clinical Data: Productive cough.  Chest congestion.  PORTABLE CHEST - 1 VIEW  Comparison: 11/20/2011.  Findings: Stable enlarged cardiac silhouette.  Improved inspiration.  Left skin fold.  Minimal diffuse peribronchial thickening.  Bilateral shoulder degenerative changes.  IMPRESSION:  1.  Minimal bronchitic changes. 2.  Stable cardiomegaly.  Original Report Authenticated By: Darrol Angel, M.D.   Dg Chest Portable 1 View  11/20/2011  *RADIOLOGY REPORT*  Clinical Data: Shortness of breath and weakness.  PORTABLE CHEST - 1 VIEW  Comparison: Portable chest 09/19/2011.  Findings: Cardiac enlargement is stable.  The lung volumes are slightly decreased.  Mild pulmonary vascular congestion is now present.  Mild bibasilar atelectasis is also evident.  No other focal airspace consolidation is present.  IMPRESSION:  1.  Stable cardiomegaly with new onset of mild pulmonary vascular congestion, suggesting early congestive heart failure. 2.  Mild bibasilar atelectasis.  Original Report Authenticated By: Jamesetta Orleans. MATTERN, M.D.   Dg Shoulder Left  11/23/2011  *RADIOLOGY REPORT*  Clinical Data:  Shoulder pain.  Stiffness.  LEFT SHOULDER - 2+ VIEW  Comparison: 07/16/2011.  Findings: Severe glenohumeral and AC joint osteoarthritis is present.  The osteophytic spurring is present off the humeral head and glenoid.  Visualized left chest appears within normal limits. The shoulder is located.  IMPRESSION: Severe glenohumeral and AC joint osteoarthritis.  No acute abnormality.  Original Report Authenticated By: Andreas Newport, M.D.    Lab Results: Basic Metabolic Panel:  Basename 12/15/11 0449  NA 137  K 5.1  CL 100  CO2 31  GLUCOSE 101*  BUN 49*  CREATININE 1.68*  CALCIUM  10.0  MG --  PHOS --   Liver Function Tests: No results found for this basename: AST:2,ALT:2,ALKPHOS:2,BILITOT:2,PROT:2,ALBUMIN:2 in the last 72 hours   CBC: No results found for this basename: WBC:2,NEUTROABS:2,HGB:2,HCT:2,MCV:2,PLT:2 in the last 72 hours  Recent Results (from the past 240 hour(s))  URINE CULTURE     Status: Normal   Collection Time   12/07/11  7:18 PM      Component Value Range Status Comment   Specimen Description URINE, CLEAN CATCH   Final    Special Requests NONE   Final    Culture  Setup Time 12/08/2011 11:15   Final    Colony Count >=100,000 COLONIES/ML   Final    Culture PROTEUS MIRABILIS   Final    Report Status 12/11/2011 FINAL   Final    Organism ID, Bacteria PROTEUS MIRABILIS   Final   CULTURE, BLOOD (ROUTINE X 2)     Status: Normal   Collection Time   12/07/11  8:27 PM      Component Value Range Status Comment   Specimen Description BLOOD RIGHT ANTECUBITAL   Final    Special Requests BOTTLES DRAWN AEROBIC AND ANAEROBIC 8CC   Final    Culture NO GROWTH 5 DAYS   Final    Report Status 12/13/2011 FINAL   Final   CULTURE, BLOOD (ROUTINE X 2)     Status: Normal   Collection Time   12/07/11  8:45 PM      Component Value Range Status Comment   Specimen Description BLOOD RIGHT HAND   Final    Special Requests BOTTLES DRAWN AEROBIC ONLY 7CC   Final    Culture NO GROWTH 5 DAYS    Final    Report Status 12/13/2011 FINAL   Final   MRSA PCR SCREENING     Status: Normal   Collection Time   12/08/11  2:26 AM      Component Value Range Status Comment   MRSA by PCR NEGATIVE  NEGATIVE Final      Hospital Course: Patient was admitted due to healthcare associated pneumonia. He was treated with combination antibiotics. Patient improved.However, he remained weak and deconditioned. He will be placed to nursing home for physical therapy.  Discharge Exam: Blood pressure 110/65, pulse 77, temperature 98.7 F (37.1 C), temperature source Oral, resp. rate 20, height 5\' 9"  (1.753 m), weight 86.4 kg (190 lb 7.6 oz), SpO2 96.00%.  Disposition:[facility]      Signed: Kang Ishida   12/16/2011, 12:09 PM

## 2011-12-16 NOTE — Clinical Social Work Note (Signed)
Reviewed bed offers w son, Azai Gaffin.  [facility] has no male bed, [facility] not responded, [facility] has done initial review and will not make bed offer to patient at this time.  Admissions is reviewing decision w additional clinical staff to determine if they can serve patient and will let us know final answer this AM.  Discussed need to widen bed search w son.  Son agreed to [facility] in [place] and several facilities in [place] ([address], [facility], [place], [facility], [facility]).  Son states family is viewing SNF placement as a 30 day period of intense rehab, with plan to return to ALF level most likely at [facility] or [address] which can handle patient need for nectar thick diet.  Will continue to discuss w family and ascertain placement decision.  Clovis Cao Clinical Social Worker [phone])

## 2011-12-16 NOTE — Progress Notes (Signed)
Thank you to Dr Jacky Kindle for this referral via the long length of stay meeting.  Patient has been discharged. Chart review complete.  Patient SNF transition will be monitored by [facility] CM RN.  If patient returns to ALF level care we will engage to assist with the plan of care and disease process management as indicated with family/patient consent.  For any additional questions or new referrals please contact Anibal Henderson BSN RN [facility] Liaison at [phone].

## 2011-12-16 NOTE — Clinical Social Work Note (Signed)
Pt d/c today by MD to [facility]. Pt, pt's son, and facility aware and agreeable. Pt to transfer with RN. D/C summary faxed.   Derenda Fennel, [place] [phone]

## 2011-12-16 NOTE — Progress Notes (Signed)
Subjective: Patient feels better. His cough and congestion is better. However, he remained weak and deconditioned. Physical therapy is reevaluating.  Objective: Vital signs in last 24 hours: Temp:  [95.4 F (35.2 C)-98.7 F (37.1 C)] 98.7 F (37.1 C) (07/30 0542) Pulse Rate:  [53-77] 77  (07/30 0542) Resp:  [20] 20  (07/30 0542) BP: (110)/(65) 110/65 mmHg (07/30 0542) SpO2:  [93 %-100 %] 95 % (07/30 0718) Weight change:  Last BM Date: 12/15/11  Intake/Output from previous day: 07/29 0701 - 07/30 0700 In: 300 [P.O.:300] Out: -   PHYSICAL EXAM General appearance: alert Resp: diminished breath sounds bilaterally Cardio: S1, S2 normal GI: soft, non-tender; bowel sounds normal; no masses,  no organomegaly Extremities: extremities normal, atraumatic, no cyanosis or edema  Lab Results:    @labtest @ [facility] No results found for this basename: PHART,PCO2,PO2ART,TCO2,HCO3 in the last 72 hours CULTURES Recent Results (from the past 240 hour(s))  URINE CULTURE     Status: Normal   Collection Time   12/07/11  7:18 PM      Component Value Range Status Comment   Specimen Description URINE, CLEAN CATCH   Final    Special Requests NONE   Final    Culture  Setup Time 12/08/2011 11:15   Final    Colony Count >=100,000 COLONIES/ML   Final    Culture PROTEUS MIRABILIS   Final    Report Status 12/11/2011 FINAL   Final    Organism ID, Bacteria PROTEUS MIRABILIS   Final   CULTURE, BLOOD (ROUTINE X 2)     Status: Normal   Collection Time   12/07/11  8:27 PM      Component Value Range Status Comment   Specimen Description BLOOD RIGHT ANTECUBITAL   Final    Special Requests BOTTLES DRAWN AEROBIC AND ANAEROBIC 8CC   Final    Culture NO GROWTH 5 DAYS   Final    Report Status 12/13/2011 FINAL   Final   CULTURE, BLOOD (ROUTINE X 2)     Status: Normal   Collection Time   12/07/11  8:45 PM      Component Value Range Status Comment   Specimen Description BLOOD RIGHT HAND   Final    Special  Requests BOTTLES DRAWN AEROBIC ONLY 7CC   Final    Culture NO GROWTH 5 DAYS   Final    Report Status 12/13/2011 FINAL   Final   MRSA PCR SCREENING     Status: Normal   Collection Time   12/08/11  2:26 AM      Component Value Range Status Comment   MRSA by PCR NEGATIVE  NEGATIVE Final    Studies/Results: No results found.  Medications: I have reviewed the patient's current medications.  Assesment: Principal Problem:  *Healthcare-associated pneumonia Active Problems:  Aspiration pneumonia  Dementia    Plan: We will continue Iv antbiiotics. Continue physical therapy. Waiting  fot nursing home placement.    LOS: 8 days   Azjah Pardo 12/16/2011, 7:35 AM

## 2011-12-16 NOTE — Clinical Social Work Note (Signed)
Medicaid prior approval submitted via [place] [employer].  Confirmation number [id number] W.  Copy placed in discharge packet for [facility] to follow up if needed.  CSW Reubin Milan to prepare discharge packet when discharge summary and instructions are ready.  Family asked to complete necessary paperwork at [facility].  Clovis Cao Clinical Social Worker [phone])

## 2011-12-16 NOTE — Progress Notes (Signed)
UR Chart Review Completed  

## 2011-12-24 NOTE — Progress Notes (Signed)
UR chart review completed.  

## 2012-01-12 LAB — GLUCOSE, CAPILLARY: Glucose-Capillary: 121 mg/dL — ABNORMAL HIGH (ref 70–99)

## 2012-01-16 ENCOUNTER — Ambulatory Visit (HOSPITAL_COMMUNITY): Payer: Medicare Other | Attending: Internal Medicine

## 2012-01-16 DIAGNOSIS — R918 Other nonspecific abnormal finding of lung field: Secondary | ICD-10-CM | POA: Insufficient documentation

## 2012-01-16 DIAGNOSIS — R05 Cough: Secondary | ICD-10-CM | POA: Insufficient documentation

## 2012-01-16 DIAGNOSIS — J449 Chronic obstructive pulmonary disease, unspecified: Secondary | ICD-10-CM | POA: Insufficient documentation

## 2012-01-16 DIAGNOSIS — J4489 Other specified chronic obstructive pulmonary disease: Secondary | ICD-10-CM | POA: Insufficient documentation

## 2012-01-16 DIAGNOSIS — R059 Cough, unspecified: Secondary | ICD-10-CM | POA: Insufficient documentation

## 2012-01-16 DIAGNOSIS — I10 Essential (primary) hypertension: Secondary | ICD-10-CM | POA: Insufficient documentation

## 2012-01-17 ENCOUNTER — Emergency Department (HOSPITAL_COMMUNITY): Payer: Medicare Other

## 2012-01-17 ENCOUNTER — Emergency Department (HOSPITAL_COMMUNITY)
Admission: EM | Admit: 2012-01-17 | Discharge: 2012-01-17 | Disposition: A | Discharge status: Skilled Nursing Facility | Payer: Medicare Other | Attending: Emergency Medicine | Admitting: Emergency Medicine

## 2012-01-17 ENCOUNTER — Inpatient Hospital Stay
Admission: RE | Admit: 2012-01-17 | Discharge: 2013-02-16 | Disposition: E | Payer: Medicaid Other | Source: Ambulatory Visit | Attending: Internal Medicine | Admitting: Internal Medicine

## 2012-01-17 ENCOUNTER — Encounter (HOSPITAL_COMMUNITY): Payer: Self-pay | Admitting: *Deleted

## 2012-01-17 DIAGNOSIS — M7989 Other specified soft tissue disorders: Secondary | ICD-10-CM

## 2012-01-17 DIAGNOSIS — J189 Pneumonia, unspecified organism: Principal | ICD-10-CM

## 2012-01-17 DIAGNOSIS — J449 Chronic obstructive pulmonary disease, unspecified: Secondary | ICD-10-CM | POA: Insufficient documentation

## 2012-01-17 DIAGNOSIS — E039 Hypothyroidism, unspecified: Secondary | ICD-10-CM | POA: Insufficient documentation

## 2012-01-17 DIAGNOSIS — R220 Localized swelling, mass and lump, head: Secondary | ICD-10-CM | POA: Insufficient documentation

## 2012-01-17 DIAGNOSIS — R1310 Dysphagia, unspecified: Secondary | ICD-10-CM

## 2012-01-17 DIAGNOSIS — M5030 Other cervical disc degeneration, unspecified cervical region: Secondary | ICD-10-CM | POA: Insufficient documentation

## 2012-01-17 DIAGNOSIS — Z79899 Other long term (current) drug therapy: Secondary | ICD-10-CM | POA: Insufficient documentation

## 2012-01-17 DIAGNOSIS — R071 Chest pain on breathing: Secondary | ICD-10-CM

## 2012-01-17 DIAGNOSIS — F0390 Unspecified dementia without behavioral disturbance: Secondary | ICD-10-CM | POA: Insufficient documentation

## 2012-01-17 DIAGNOSIS — J4489 Other specified chronic obstructive pulmonary disease: Secondary | ICD-10-CM | POA: Insufficient documentation

## 2012-01-17 DIAGNOSIS — R109 Unspecified abdominal pain: Secondary | ICD-10-CM

## 2012-01-17 DIAGNOSIS — W1809XA Striking against other object with subsequent fall, initial encounter: Secondary | ICD-10-CM | POA: Insufficient documentation

## 2012-01-17 DIAGNOSIS — M79602 Pain in left arm: Secondary | ICD-10-CM

## 2012-01-17 DIAGNOSIS — R0989 Other specified symptoms and signs involving the circulatory and respiratory systems: Secondary | ICD-10-CM

## 2012-01-17 DIAGNOSIS — I10 Essential (primary) hypertension: Secondary | ICD-10-CM | POA: Insufficient documentation

## 2012-01-17 DIAGNOSIS — R52 Pain, unspecified: Secondary | ICD-10-CM

## 2012-01-17 DIAGNOSIS — R609 Edema, unspecified: Secondary | ICD-10-CM

## 2012-01-17 DIAGNOSIS — S0990XA Unspecified injury of head, initial encounter: Secondary | ICD-10-CM | POA: Insufficient documentation

## 2012-01-17 DIAGNOSIS — R05 Cough: Secondary | ICD-10-CM

## 2012-01-17 DIAGNOSIS — E079 Disorder of thyroid, unspecified: Secondary | ICD-10-CM

## 2012-01-17 NOTE — ED Provider Notes (Signed)
History   This chart was scribed for [name] Lennert, MD scribed by Magnus Sinning. The patient was seen in room APA14/APA14 19:04.    CSN: [id number]  Arrival date & time 2012-01-20  1841      Chief Complaint  Patient presents with  . Fall    (Consider location/radiation/quality/duration/timing/severity/associated sxs/prior treatment) HPI Comments: Rea Reser is a 78 y.o. Male BIB ems who presents to the Emergency Department complaining of a fall. Patient was reportedly standing in a bathroom doorway when he fell backwards, hitting his head on the floor. Denies loc and or any current pain. Patient is currently on oxygen at home and has hx od mild dementia and several falls over the past year.   Patient is a 78 y.o. male presenting with fall. The history is provided by the patient. No language interpreter was used.  Fall The accident occurred 1 to 2 hours ago. The fall occurred while standing. He fell from a height of 1 to 2 ft. He landed on a hard floor. There was no blood loss. The point of impact was the head. The pain is present in the head. The pain is mild. There was no entrapment after the fall. There was no drug use involved in the accident. There was no alcohol use involved in the accident. Pertinent negatives include no abdominal pain, no hematuria and no headaches. Treatment on scene includes a c-collar. He has tried nothing for the symptoms. The treatment provided no relief.    Past Medical History  Diagnosis Date  . COPD (chronic obstructive pulmonary disease)   . Shortness of breath   . Asthma   . DEMENTIA     short term  . Hypertension   . Arthritis   . Hypothyroidism   . CHF (congestive heart failure)   . BPH (benign prostatic hyperplasia)   . Hiatal hernia   . Neuropathy     Past Surgical History  Procedure Date  . Repair knee ligament     No family history on file.  History  Substance Use Topics  . Smoking status: Never Smoker   . Smokeless tobacco:  Not on file  . Alcohol Use: No      Review of Systems  Constitutional: Negative for fatigue.  HENT: Negative for congestion, sinus pressure and ear discharge.   Eyes: Negative for discharge.  Respiratory: Negative for cough.   Cardiovascular: Negative for chest pain.  Gastrointestinal: Negative for abdominal pain and diarrhea.  Genitourinary: Negative for frequency and hematuria.  Musculoskeletal: Negative for back pain.  Skin: Negative for rash.  Neurological: Negative for seizures and headaches.  Hematological: Negative.   Psychiatric/Behavioral: Negative for hallucinations.  All other systems reviewed and are negative.    Allergies  Codeine; Penicillins; and Sulfa antibiotics  Home Medications   Current Outpatient Rx  Name Route Sig Dispense Refill  . ALBUTEROL SULFATE (2.5 MG/3ML) 0.083% IN NEBU Nebulization Take 2.5 mg by nebulization 4 (four) times daily.    Marland Kitchen ALPRAZOLAM 0.25 MG PO TABS Oral Take 0.375 mg by mouth at bedtime. Take one and one-half of a tablet at bedtime    . ASPIRIN EC 81 MG PO TBEC Oral Take 81 mg by mouth daily.     Marland Kitchen CALCIUM 600/VITAMIN D PO Oral Take 1 tablet by mouth daily.    Marland Kitchen DIGOXIN 0.125 MG PO TABS Oral Take 125 mcg by mouth daily.     Marland Kitchen DILTIAZEM HCL ER BEADS 120 MG PO CP24 Oral Take 120  mg by mouth daily.     . OMEGA-3 FATTY ACIDS 1000 MG PO CAPS Oral Take 1 g by mouth daily.     . FUROSEMIDE 40 MG PO TABS Oral Take 40 mg by mouth daily.     Marland Kitchen GABAPENTIN 600 MG PO TABS Oral Take 600 mg by mouth 4 (four) times daily.     Drema Balzarine HCL 200-25 MG PO CAPS Oral Take 2 tablets by mouth at bedtime. For pain and sleep    . IPRATROPIUM BROMIDE 0.02 % IN SOLN Nebulization Take 500 mcg by nebulization 4 (four) times daily.    Marland Kitchen LECITHIN 1200 MG PO CAPS Oral Take 1,200 mg by mouth daily.    Marland Kitchen LEVOTHYROXINE SODIUM 50 MCG PO TABS Oral Take 50 mcg by mouth daily before breakfast.     . PAROXETINE HCL 40 MG PO TABS Oral Take 40 mg by  mouth at bedtime.     Marland Kitchen POLYETHYLENE GLYCOL 3350 PO PACK Oral Take 1 g by mouth daily. **MIX ONE CAPFUL IN 8 OUNCES OF WATER/JUICE THEN DRINK DAILY**    . POTASSIUM CHLORIDE ER 10 MEQ PO TBCR Oral Take 10 mEq by mouth daily.     . QUINAPRIL HCL 20 MG PO TABS Oral Take 20 mg by mouth daily.     . SENNA 8.6 MG PO TABS Oral Take 3 tablets by mouth at bedtime.     . TAMSULOSIN HCL 0.4 MG PO CAPS Oral Take 0.4 mg by mouth at bedtime.     Marland Kitchen VITAMIN C 500 MG PO TABS Oral Take 1,000 mg by mouth daily.     Marland Kitchen VITAMIN E 400 UNITS PO CAPS Oral Take 400 Units by mouth daily.       BP 151/56  Pulse 94  Temp 98.4 F (36.9 C) (Oral)  Resp 20  SpO2 94%  Physical Exam  Nursing note and vitals reviewed. Constitutional: He is oriented to person, place, and time. He appears well-developed.       Mild dementia  HENT:  Head: Normocephalic and atraumatic.       Swelling to occipital part of head   Eyes: Conjunctivae and EOM are normal. No scleral icterus.  Neck: Neck supple. No thyromegaly present.  Cardiovascular: Normal rate and regular rhythm.  Exam reveals no gallop and no friction rub.   No murmur heard. Pulmonary/Chest: No stridor. He has no wheezes. He has no rales. He exhibits no tenderness.  Abdominal: He exhibits no distension. There is no tenderness. There is no rebound.  Musculoskeletal: Normal range of motion. He exhibits no edema.  Lymphadenopathy:    He has no cervical adenopathy.  Neurological: He is oriented to person, place, and time. Coordination normal.  Skin: No rash noted. No erythema.  Psychiatric: He has a normal mood and affect. His behavior is normal.    ED Course  Procedures (including critical care time) DIAGNOSTIC STUDIES: Oxygen Saturation is 94% on [place], normal by my interpretation.    COORDINATION OF CARE: 19:05: Physical exam started. 19:06:Physical exam completed. 20:10: Patient notified of his radiology results and provided intent to d/c home.   Labs Reviewed  - No data to display  Dg Chest 1 View  01/16/2012  *RADIOLOGY REPORT*  Clinical Data: Cough with chest congestion.  COPD and asthma. Hypertension and congestive heart failure  CHEST - 1 VIEW  Comparison: 07/26/2013and CT 08/02/2011  Findings: Cardiomegaly is again identified and appears slightly less prominent than on the prior exam.  The  morphology of the silhouette has an appearance more suggestive today of global cardiac enlargement rather than a pericardial effusion.  Aortic ectasia and calcification is unchanged.  There is persistent increased density identified at the left lung base and this appears somewhat decreased relative to the prior exam.  This may reflect compressive atelectasis due to the cardiomegaly but the possibility of a left lower lobe pneumonia is not excluded.  No definite pleural effusion is noted.  The remainder of the lung fields are clear with no evidence for congestive failure noted.  Linear calcific density overlying the lateral aspect of the right thorax is related to the right scapula and reflects a chronic finding.  No new bone lesions are seen.  IMPRESSION: Persistent left lower lobe infiltrate and cardiomegaly with left lower lobe pneumonia not excluded.   Original Report Authenticated By: Bertha Stakes, M.D.    Ct Head Wo Contrast  02/03/2012  *RADIOLOGY REPORT*  Clinical Data:  Fall, pain at back of head, history COPD, asthma, hypertension, CHF  CT HEAD WITHOUT CONTRAST CT CERVICAL SPINE WITHOUT CONTRAST  Technique:  Multidetector CT imaging of the head and cervical spine was performed following the standard protocol without intravenous contrast.  Multiplanar CT image reconstructions of the cervical spine were also generated.  Comparison:  CT head 11/20/2011, CT cervical spine 07/16/2011  CT HEAD  Findings: Generalized atrophy. Normal ventricular morphology. No midline shift or mass effect. Small vessel chronic ischemic changes of deep cerebral white matter. Old  anterior right basal ganglia lacunar infarct extending into periventricular white matter. No intracranial hemorrhage, mass lesion or evidence of acute infarction. No extra-axial fluid collections. Skull intact. Visualized paranasal sinuses and mastoid air cells clear.  IMPRESSION: Atrophy with small vessel chronic ischemic changes of deep cerebral white matter. No acute intracranial abnormalities.  CT CERVICAL SPINE  Findings: Disc space narrowing C6-C7 and C7-T1. Minimal anterolisthesis at C7-T1, unchanged. Vertebral body heights maintained without fracture or subluxation. Multilevel facet degenerative changes. Prevertebral soft tissues normal thickness. Bones appear diffusely demineralized. Visualized skull base intact. Atherosclerotic calcification of carotid and vertebral arteries. Lung apices clear. Bilateral thyroid calcifications with a dominant mass in left thyroid lobe 4.8 x 3.2 cm. Tortuous subclavian arteries.  IMPRESSION: Degenerative disc and facet disease changes of the cervical spine with minimal stable anterolisthesis at C7-T1. No acute cervical spine abnormalities identified.  Extensive atherosclerotic calcification. Left thyroid mass 4.8 cm in greatest size; dedicated thyroid ultrasound recommended for further assessment.   Original Report Authenticated By: Lollie Marrow, M.D.    Ct Cervical Spine Wo Contrast  2012-02-03  *RADIOLOGY REPORT*  Clinical Data:  Fall, pain at back of head, history COPD, asthma, hypertension, CHF  CT HEAD WITHOUT CONTRAST CT CERVICAL SPINE WITHOUT CONTRAST  Technique:  Multidetector CT imaging of the head and cervical spine was performed following the standard protocol without intravenous contrast.  Multiplanar CT image reconstructions of the cervical spine were also generated.  Comparison:  CT head 11/20/2011, CT cervical spine 07/16/2011  CT HEAD  Findings: Generalized atrophy. Normal ventricular morphology. No midline shift or mass effect. Small vessel chronic  ischemic changes of deep cerebral white matter. Old anterior right basal ganglia lacunar infarct extending into periventricular white matter. No intracranial hemorrhage, mass lesion or evidence of acute infarction. No extra-axial fluid collections. Skull intact. Visualized paranasal sinuses and mastoid air cells clear.  IMPRESSION: Atrophy with small vessel chronic ischemic changes of deep cerebral white matter. No acute intracranial abnormalities.  CT CERVICAL SPINE  Findings: Disc space narrowing C6-C7 and C7-T1. Minimal anterolisthesis at C7-T1, unchanged. Vertebral body heights maintained without fracture or subluxation. Multilevel facet degenerative changes. Prevertebral soft tissues normal thickness. Bones appear diffusely demineralized. Visualized skull base intact. Atherosclerotic calcification of carotid and vertebral arteries. Lung apices clear. Bilateral thyroid calcifications with a dominant mass in left thyroid lobe 4.8 x 3.2 cm. Tortuous subclavian arteries.  IMPRESSION: Degenerative disc and facet disease changes of the cervical spine with minimal stable anterolisthesis at C7-T1. No acute cervical spine abnormalities identified.  Extensive atherosclerotic calcification. Left thyroid mass 4.8 cm in greatest size; dedicated thyroid ultrasound recommended for further assessment.   Original Report Authenticated By: Lollie Marrow, M.D.      No diagnosis found.    MDM   The chart was scribed for me under my direct supervision.  I personally performed the history, physical, and medical decision making and all procedures in the evaluation of this patient.[name] Lennert, MD 12/21/2011 2015

## 2012-01-17 NOTE — ED Notes (Signed)
Pt waiting on techs from [facility] to pick him up.

## 2012-01-17 NOTE — ED Notes (Signed)
Pt denies back pain.  Removed pt from [facility]sb via log roll with RN and 2 NTs.  nad noted.

## 2012-01-17 NOTE — ED Notes (Signed)
Pt resident at [facility] - EMS reports pt was standing in bathroom doorway, lost balance, falling backward, hitting head on floor.  Denies LOC.  Pt alert and oriented x 4 at this time.  c-collar and lsb in place upon arrival.  Denies back pain, c/o pain to back of head.  No bleeding noted.  Pupils PERRLA.

## 2012-01-18 DEATH — deceased

## 2012-01-21 ENCOUNTER — Other Ambulatory Visit (HOSPITAL_BASED_OUTPATIENT_CLINIC_OR_DEPARTMENT_OTHER): Payer: Self-pay | Admitting: Internal Medicine

## 2012-01-21 DIAGNOSIS — G459 Transient cerebral ischemic attack, unspecified: Secondary | ICD-10-CM

## 2012-01-22 ENCOUNTER — Ambulatory Visit (HOSPITAL_COMMUNITY)
Admission: RE | Admit: 2012-01-22 | Discharge: 2012-01-22 | Disposition: A | Discharge status: Home / Self Care | Payer: Medicare Other | Source: Ambulatory Visit | Attending: Internal Medicine | Admitting: Internal Medicine

## 2012-01-22 ENCOUNTER — Other Ambulatory Visit (HOSPITAL_BASED_OUTPATIENT_CLINIC_OR_DEPARTMENT_OTHER): Payer: Self-pay | Admitting: Internal Medicine

## 2012-01-22 DIAGNOSIS — G459 Transient cerebral ischemic attack, unspecified: Secondary | ICD-10-CM

## 2012-02-09 ENCOUNTER — Other Ambulatory Visit (HOSPITAL_BASED_OUTPATIENT_CLINIC_OR_DEPARTMENT_OTHER): Payer: Self-pay | Admitting: Internal Medicine

## 2012-02-09 ENCOUNTER — Ambulatory Visit (HOSPITAL_COMMUNITY)
Admission: RE | Admit: 2012-02-09 | Discharge: 2012-02-09 | Disposition: A | Discharge status: Home / Self Care | Payer: Medicare Other | Source: Ambulatory Visit | Attending: Internal Medicine | Admitting: Internal Medicine

## 2012-02-09 DIAGNOSIS — J189 Pneumonia, unspecified organism: Secondary | ICD-10-CM

## 2012-02-09 DIAGNOSIS — R059 Cough, unspecified: Secondary | ICD-10-CM | POA: Insufficient documentation

## 2012-02-09 DIAGNOSIS — R05 Cough: Secondary | ICD-10-CM | POA: Insufficient documentation

## 2012-02-11 LAB — GLUCOSE, CAPILLARY

## 2012-02-12 LAB — GLUCOSE, CAPILLARY: Glucose-Capillary: 115 mg/dL — ABNORMAL HIGH (ref 70–99)

## 2012-02-13 LAB — GLUCOSE, CAPILLARY: Glucose-Capillary: 106 mg/dL — ABNORMAL HIGH (ref 70–99)

## 2012-02-14 LAB — GLUCOSE, CAPILLARY: Glucose-Capillary: 112 mg/dL — ABNORMAL HIGH (ref 70–99)

## 2012-02-15 LAB — GLUCOSE, CAPILLARY
Glucose-Capillary: 114 mg/dL — ABNORMAL HIGH (ref 70–99)
Glucose-Capillary: 114 mg/dL — ABNORMAL HIGH (ref 70–99)

## 2012-02-16 LAB — GLUCOSE, CAPILLARY: Glucose-Capillary: 119 mg/dL — ABNORMAL HIGH (ref 70–99)

## 2012-02-17 LAB — GLUCOSE, CAPILLARY
Glucose-Capillary: 112 mg/dL — ABNORMAL HIGH (ref 70–99)
Glucose-Capillary: 91 mg/dL (ref 70–99)

## 2012-02-18 LAB — GLUCOSE, CAPILLARY
Glucose-Capillary: 129 mg/dL — ABNORMAL HIGH (ref 70–99)
Glucose-Capillary: 107 mg/dL — ABNORMAL HIGH (ref 70–99)

## 2012-02-19 ENCOUNTER — Ambulatory Visit (HOSPITAL_COMMUNITY)
Admit: 2012-02-19 | Discharge: 2012-02-19 | Disposition: A | Discharge status: Home / Self Care | Payer: Medicare Other | Source: Skilled Nursing Facility | Attending: Internal Medicine | Admitting: Internal Medicine

## 2012-02-19 DIAGNOSIS — R059 Cough, unspecified: Secondary | ICD-10-CM | POA: Insufficient documentation

## 2012-02-19 DIAGNOSIS — R05 Cough: Secondary | ICD-10-CM | POA: Insufficient documentation

## 2012-02-19 DIAGNOSIS — J189 Pneumonia, unspecified organism: Secondary | ICD-10-CM | POA: Insufficient documentation

## 2012-02-20 ENCOUNTER — Ambulatory Visit (HOSPITAL_COMMUNITY): Payer: Medicare Other | Attending: Internal Medicine

## 2012-02-20 ENCOUNTER — Ambulatory Visit (HOSPITAL_COMMUNITY): Payer: Medicare Other

## 2012-02-20 DIAGNOSIS — R109 Unspecified abdominal pain: Secondary | ICD-10-CM | POA: Insufficient documentation

## 2012-02-20 DIAGNOSIS — R059 Cough, unspecified: Secondary | ICD-10-CM | POA: Insufficient documentation

## 2012-02-20 DIAGNOSIS — R05 Cough: Secondary | ICD-10-CM | POA: Insufficient documentation

## 2012-02-25 LAB — GLUCOSE, CAPILLARY: Glucose-Capillary: 94 mg/dL (ref 70–99)

## 2012-02-27 ENCOUNTER — Ambulatory Visit (HOSPITAL_COMMUNITY)
Admit: 2012-02-27 | Discharge: 2012-02-27 | Disposition: A | Discharge status: Home / Self Care | Payer: Medicare Other | Source: Skilled Nursing Facility | Attending: Internal Medicine | Admitting: Internal Medicine

## 2012-02-27 ENCOUNTER — Other Ambulatory Visit (HOSPITAL_COMMUNITY): Payer: Medicare Other

## 2012-02-27 DIAGNOSIS — R339 Retention of urine, unspecified: Secondary | ICD-10-CM | POA: Insufficient documentation

## 2012-02-27 LAB — GLUCOSE, CAPILLARY: Glucose-Capillary: 114 mg/dL — ABNORMAL HIGH (ref 70–99)

## 2012-03-01 ENCOUNTER — Ambulatory Visit (HOSPITAL_COMMUNITY)
Admit: 2012-03-01 | Discharge: 2012-03-01 | Disposition: A | Discharge status: Home / Self Care | Payer: Medicare Other | Source: Skilled Nursing Facility | Attending: Internal Medicine | Admitting: Internal Medicine

## 2012-03-01 DIAGNOSIS — I714 Abdominal aortic aneurysm, without rupture, unspecified: Secondary | ICD-10-CM | POA: Insufficient documentation

## 2012-03-01 DIAGNOSIS — R109 Unspecified abdominal pain: Secondary | ICD-10-CM | POA: Insufficient documentation

## 2012-03-01 LAB — GLUCOSE, CAPILLARY: Glucose-Capillary: 90 mg/dL (ref 70–99)

## 2012-03-03 LAB — GLUCOSE, CAPILLARY

## 2012-03-05 LAB — GLUCOSE, CAPILLARY: Glucose-Capillary: 108 mg/dL — ABNORMAL HIGH (ref 70–99)

## 2012-03-29 ENCOUNTER — Ambulatory Visit (HOSPITAL_COMMUNITY)
Admit: 2012-03-29 | Discharge: 2012-03-29 | Disposition: A | Discharge status: Skilled Nursing Facility | Payer: Medicare Other | Source: Skilled Nursing Facility | Attending: Internal Medicine | Admitting: Internal Medicine

## 2012-03-29 DIAGNOSIS — R05 Cough: Secondary | ICD-10-CM | POA: Insufficient documentation

## 2012-03-29 DIAGNOSIS — R059 Cough, unspecified: Secondary | ICD-10-CM | POA: Insufficient documentation

## 2012-03-29 DIAGNOSIS — I517 Cardiomegaly: Secondary | ICD-10-CM | POA: Insufficient documentation

## 2012-04-29 ENCOUNTER — Ambulatory Visit (HOSPITAL_COMMUNITY)
Admit: 2012-04-29 | Discharge: 2012-04-29 | Disposition: A | Discharge status: Home / Self Care | Payer: Medicare Other | Attending: Internal Medicine | Admitting: Internal Medicine

## 2012-04-29 DIAGNOSIS — F0390 Unspecified dementia without behavioral disturbance: Secondary | ICD-10-CM | POA: Insufficient documentation

## 2012-04-29 DIAGNOSIS — E041 Nontoxic single thyroid nodule: Secondary | ICD-10-CM | POA: Insufficient documentation

## 2012-05-24 ENCOUNTER — Ambulatory Visit (HOSPITAL_COMMUNITY)
Admit: 2012-05-24 | Discharge: 2012-05-24 | Disposition: A | Discharge status: Home / Self Care | Payer: Medicare Other | Attending: Internal Medicine | Admitting: Internal Medicine

## 2012-05-24 DIAGNOSIS — M25519 Pain in unspecified shoulder: Secondary | ICD-10-CM | POA: Insufficient documentation

## 2012-06-15 ENCOUNTER — Ambulatory Visit (HOSPITAL_COMMUNITY): Payer: Medicare Other | Attending: Internal Medicine

## 2012-06-15 DIAGNOSIS — M79609 Pain in unspecified limb: Secondary | ICD-10-CM | POA: Insufficient documentation

## 2012-06-15 DIAGNOSIS — M25519 Pain in unspecified shoulder: Secondary | ICD-10-CM | POA: Insufficient documentation

## 2012-06-22 ENCOUNTER — Ambulatory Visit (HOSPITAL_COMMUNITY)
Admit: 2012-06-22 | Discharge: 2012-06-22 | Disposition: A | Discharge status: Home / Self Care | Payer: Medicare Other | Source: Ambulatory Visit | Attending: Internal Medicine | Admitting: Internal Medicine

## 2012-06-22 ENCOUNTER — Ambulatory Visit (HOSPITAL_COMMUNITY): Admit: 2012-06-22 | Payer: Medicare Other

## 2012-06-22 ENCOUNTER — Ambulatory Visit (HOSPITAL_COMMUNITY): Payer: Medicare Other

## 2012-06-22 DIAGNOSIS — E049 Nontoxic goiter, unspecified: Secondary | ICD-10-CM | POA: Insufficient documentation

## 2012-06-22 MED ORDER — LIDOCAINE HCL (PF) 2 % IJ SOLN
INTRAMUSCULAR | Status: AC
  Filled 2012-06-22: qty 20

## 2012-06-22 NOTE — Progress Notes (Signed)
Pt here for Thyroid Biopsy..  Procedure started at 1045.  Lidocaine 2% 10 cc injected into neck area.  6 total passes on let side and 3 total passes on right side.  Pt tolerated procedure well.  VSS.   Band-aids applied to both sides of the neck with no bleeding noted after the procedure.  Procedure end time 1120.

## 2012-06-28 LAB — GLUCOSE, CAPILLARY: Glucose-Capillary: 86 mg/dL (ref 70–99)

## 2012-07-05 LAB — GLUCOSE, CAPILLARY: Glucose-Capillary: 90 mg/dL (ref 70–99)

## 2012-07-16 LAB — GLUCOSE, CAPILLARY: Glucose-Capillary: 147 mg/dL — ABNORMAL HIGH (ref 70–99)

## 2012-07-17 LAB — GLUCOSE, CAPILLARY
Glucose-Capillary: 79 mg/dL (ref 70–99)
Glucose-Capillary: 134 mg/dL — ABNORMAL HIGH (ref 70–99)

## 2012-07-18 LAB — GLUCOSE, CAPILLARY

## 2012-07-19 LAB — GLUCOSE, CAPILLARY: Glucose-Capillary: 84 mg/dL (ref 70–99)

## 2012-07-26 LAB — GLUCOSE, CAPILLARY: Glucose-Capillary: 88 mg/dL (ref 70–99)

## 2012-08-02 LAB — GLUCOSE, CAPILLARY: Glucose-Capillary: 95 mg/dL (ref 70–99)

## 2012-09-06 LAB — GLUCOSE, CAPILLARY

## 2012-09-13 LAB — GLUCOSE, CAPILLARY: Glucose-Capillary: 107 mg/dL — ABNORMAL HIGH (ref 70–99)

## 2012-09-14 ENCOUNTER — Non-Acute Institutional Stay (SKILLED_NURSING_FACILITY): Payer: Medicare Other | Admitting: Internal Medicine

## 2012-09-14 DIAGNOSIS — J441 Chronic obstructive pulmonary disease with (acute) exacerbation: Secondary | ICD-10-CM

## 2012-09-14 DIAGNOSIS — I509 Heart failure, unspecified: Secondary | ICD-10-CM

## 2012-09-14 DIAGNOSIS — F32A Depression, unspecified: Secondary | ICD-10-CM

## 2012-09-14 DIAGNOSIS — R609 Edema, unspecified: Secondary | ICD-10-CM

## 2012-09-14 DIAGNOSIS — F29 Unspecified psychosis not due to a substance or known physiological condition: Secondary | ICD-10-CM

## 2012-09-14 DIAGNOSIS — E039 Hypothyroidism, unspecified: Secondary | ICD-10-CM

## 2012-09-14 DIAGNOSIS — G609 Hereditary and idiopathic neuropathy, unspecified: Secondary | ICD-10-CM

## 2012-09-14 DIAGNOSIS — N400 Enlarged prostate without lower urinary tract symptoms: Secondary | ICD-10-CM

## 2012-09-14 DIAGNOSIS — F329 Major depressive disorder, single episode, unspecified: Secondary | ICD-10-CM

## 2012-09-14 NOTE — Progress Notes (Signed)
Patient ID: [name], male   DOB: January 28, 1918, 79 y.o.   MRN: [id number]  Chief complaint-medical management of COPD atrial fibrillation dementia with delusions psychosis-edema-hypothyroidism-urinary retention-constipation-.  History of present illness.  Patient is a pleasant elderly resident with the above diagnoses.  Most acute issue recently has been some delusions-complicated with his history of mild to moderate dementia   These are often of a sexual nature.  Whispered all was increased in it appears this has helped I do not see recent episodes noted.  He does have some intermittent edema of his arms and legs he has developed once again some edema of his left hand there is some discomfort here there's been no history of trauma to my knowledge.  Previous studies x-rays and Dopplers have not really shown anything of significance.  His other issues appear to be relatively stable.  -He does have a history of a thyroid mass and ultrasound showing multi-nodular goiter without malignancy.  This has been relatively stable.  Today other than left hand edema he has no complaints.  Family medical social history has been reviewed.  Medications have been reviewed per MAR.  Review of systems this is somewhat limited secondary to dementia.  In general no fever or chills noted initially has lost some weight but this has stabilized the past month.  Respiratory history COPD but did not complain of shortness of breath or cough today.  Cardiac-no complaints of chest pain.  Muscle skeletal-does complain at times of some left hand discomfort there has been some edema this comes and goes.  Otherwise denies joint pain.  Neurologic-history of neuropathy but does not complaining of pain today or headache or dizziness.  Psych-as stated above history of dementia complicated with psychosis.  Physical exam.  Temperature is 97.2 pulse 65 respirations 24 blood pressure 113/63-162/76 in this  range I do not see consistent elevations weight is 171.4 this is unstable the past month.  In general this is a frail elderly male in no distress lying comfortably in bed.  The skin is warm and dry.  Eyes pupils equal round react to light sclerae and conjunctivae are clear he does wear prescription lenses.  Oropharynx clear mucous membranes moist.  Chest-he does have reduced breath sounds but no rhonchi rales or wheezes no labored breathing.  Heart is irregular irregular rate and rhythm without murmur gallop or rub--he has some mild lower extremity edema this is not increased from baseline---no really significant sacral edema  Muscle skeletal-does have some edema of his left hand this is slightly tender to touch-there is no erythema-the edema is flesh-colored.  It is cool to touch.  Neurologic grossly intact his speech is clear.  Psyche is oriented to self he did know me by name follow simple verbal commands without difficulty.  Labs.  07/26/2012.  GUY-4.034.  WBC 6.5 hemoglobin 11.0 platelets 154. Her  Sodium 140 potassium 4.0 BUN 24 creatinine 1.16-liver function tests within normal limits except albumin of 3.2   07/15/2012.  Digoxin-1.09. Marland Kitchen  [id number].  Assessment and plan.  #1-left hand edema-Will do a short course of Naprosyn 250 mg twice a day for 5 days to see if this will help-question pseudogout-continue to monitor.  #2-CHF-this appears to be stable he is on Lasix with potassium supplementation recent metabolic panel was unremarkable.  #3-COPD-this appears stable on when necessary nebulizers.  #4-hypothyroidism-this appears to be stable on Synthroid recent TSH was within normal limits.  #5 as atrial fibrillation-at this point rate control he is on  aspirin for anticoagulation continues on digoxin as well for rate control.  #6-dementia with psychosis-this appears to have stabilized he is on Risperdal twice a day this is followed by psych services as well.--He  continues on Depakote as well  #7 neuropathy---this is stable on Neurontin.  #8-history urinary retention-this appears to be stable on Flomax.  #9-depression-continues on Paxil this again is followed by psych services.  #10 -- history constipation-at this point stable on Senokot.  [id number]    .

## 2012-09-20 LAB — GLUCOSE, CAPILLARY

## 2012-09-21 ENCOUNTER — Ambulatory Visit (HOSPITAL_COMMUNITY)
Admission: RE | Admit: 2012-09-21 | Discharge: 2012-09-21 | Disposition: A | Discharge status: Home / Self Care | Payer: Medicare Other | Attending: Internal Medicine | Admitting: Internal Medicine

## 2012-09-21 ENCOUNTER — Encounter: Payer: Self-pay | Admitting: Internal Medicine

## 2012-09-21 ENCOUNTER — Non-Acute Institutional Stay (SKILLED_NURSING_FACILITY): Payer: Medicare Other | Admitting: Internal Medicine

## 2012-09-21 ENCOUNTER — Ambulatory Visit (HOSPITAL_COMMUNITY): Payer: Medicare Other | Attending: Internal Medicine

## 2012-09-21 DIAGNOSIS — R059 Cough, unspecified: Secondary | ICD-10-CM | POA: Insufficient documentation

## 2012-09-21 DIAGNOSIS — R609 Edema, unspecified: Secondary | ICD-10-CM

## 2012-09-21 DIAGNOSIS — R05 Cough: Secondary | ICD-10-CM

## 2012-09-21 DIAGNOSIS — J441 Chronic obstructive pulmonary disease with (acute) exacerbation: Secondary | ICD-10-CM

## 2012-09-21 NOTE — Progress Notes (Signed)
Patient ID: [name], male   DOB: 07-25-1917, 79 y.o.   MRN: [id number]  Chief complaint-acute visit secondary to cough --chest congestion.  History of present illness.  Patient is a pleasant elderly resident with a history of COPD and has been quite stable.  However today apparently he developed a cough productive of some green phlegm-he also has some chest congestion-he does not complaining of shortness of breath his O2 saturation is 96% on room air.  He has received at times nebulizers in the past and has them available when necessary--he does have some previous history of pneumonia as well.  Family medical social history has been reviewed her history and physical on 12/17/2011.  Medications have been reviewed per MAR.Marland Kitchen  Review of systems.  Limited secondary to dementia but per patient does not complaining of shortness of breath or chest pain.  Nursing staff has not noted any fever chills-.  Physical exam.  He is afebrile O2 saturation 96% on room air.  In general this is a frail elderly male who appears to be at his baseline.  The skin is warm and dry.  Oropharynx is clear mucous membranes moist.  Chest-he has some diffuse coarse breath sounds-there is no labored breathing.  Heart is regular rate and rhythm with occasional irregular beats he has trace lower extremity edema I do note low left arm h as 2+ edema this is currently  nonerythematous nontender radial pulse is palpable  He does have it elevated somewhat on the wheelchair with the support her  Abdomen is soft nontender positive bowel sounds.  Extremities as noted above resolve at baseline ambulates in a wheelchair.  Labs.  07/26/2012.  WBC 6.5 hemoglobin 11.0 platelets 154.  Sodium 140 potassium 4 BUN 24 creatinine 1.16 liver function tests within normal limits except albumin of 3.2.  Assessment and plan.  #1-cough with green phlegm-with history of pneumonia and COPD exacerbation we'll treat  aggressively with Avelox 400 mg a day for 7 days-also obtain a chest x-ray-Will start Mucinex 600 mg twice a day for 7 days-and monitor with vital signs pulse ox every shift for 72 hours.  Also will make duo nebs routine every 6 hours for 48 hours and then when necessary.  #2-edema left arm-Will obtain a Doppler ultrasound to rule out clot since this appears to be persisting although I suspect this is largely dependent  Edema  [id number]

## 2012-09-21 NOTE — Progress Notes (Signed)
Patient ID: [name], male   DOB: 09/01/1917, 79 y.o.   MRN: [id number] This encounter was created in error - please disregard.

## 2012-09-22 ENCOUNTER — Ambulatory Visit (HOSPITAL_COMMUNITY)
Admit: 2012-09-22 | Discharge: 2012-09-22 | Disposition: A | Discharge status: Home / Self Care | Payer: Medicare Other | Attending: Internal Medicine | Admitting: Internal Medicine

## 2012-09-22 DIAGNOSIS — J3489 Other specified disorders of nose and nasal sinuses: Secondary | ICD-10-CM | POA: Insufficient documentation

## 2012-09-22 DIAGNOSIS — R059 Cough, unspecified: Secondary | ICD-10-CM | POA: Insufficient documentation

## 2012-09-22 DIAGNOSIS — R05 Cough: Secondary | ICD-10-CM | POA: Insufficient documentation

## 2012-09-23 ENCOUNTER — Non-Acute Institutional Stay (SKILLED_NURSING_FACILITY): Payer: Medicare Other | Admitting: Internal Medicine

## 2012-09-23 ENCOUNTER — Ambulatory Visit (HOSPITAL_COMMUNITY)
Admit: 2012-09-23 | Discharge: 2012-09-23 | Disposition: A | Discharge status: Home / Self Care | Payer: Medicare Other | Source: Ambulatory Visit | Attending: Internal Medicine | Admitting: Internal Medicine

## 2012-09-23 DIAGNOSIS — I82629 Acute embolism and thrombosis of deep veins of unspecified upper extremity: Secondary | ICD-10-CM | POA: Insufficient documentation

## 2012-09-23 DIAGNOSIS — M79609 Pain in unspecified limb: Secondary | ICD-10-CM | POA: Insufficient documentation

## 2012-09-23 DIAGNOSIS — M7989 Other specified soft tissue disorders: Secondary | ICD-10-CM | POA: Insufficient documentation

## 2012-09-23 DIAGNOSIS — I82402 Acute embolism and thrombosis of unspecified deep veins of left lower extremity: Secondary | ICD-10-CM

## 2012-09-23 DIAGNOSIS — R609 Edema, unspecified: Secondary | ICD-10-CM

## 2012-09-23 DIAGNOSIS — I82409 Acute embolism and thrombosis of unspecified deep veins of unspecified lower extremity: Secondary | ICD-10-CM

## 2012-09-23 DIAGNOSIS — J441 Chronic obstructive pulmonary disease with (acute) exacerbation: Secondary | ICD-10-CM

## 2012-09-23 NOTE — Progress Notes (Signed)
Patient ID: [name], male   DOB: 1918/04/13, 79 y.o.   MRN: [id number] Chief complaint-acute visit secondary to cough --chest congestion.   History of present illness.   Patient is a pleasant elderly resident with a history of COPD and has been quite stable.   Earlier this week he developed a cough productive of some green phlegm-we did order a x-ray that showed bibasilar atelectasis but no acute process.  He is on a course of Avelox as well as Mucinex and duo nebs this appears to be helping he appears to be somewhat improved today --he never did appear to be in any distress-his lungs sound a bit clearer today he is sitting comfortably in his wheelchair does not complain of shortness of breath.  O2 stats have been in the 90s on room air he did get into the 80s last night apparently his head of the bed was  elevated and O2 status quickly improved into the 90s  He also had some persistent lower left arm edema-we did order a Doppler ultrasound which is come back showing essentially subcutaneous edema lower arm however upper arm did show a short segment DVT within the left brachial vein at approximately the mid humerus level there was very minimal thrombus burden-he does not complain of any pain here physical exam is quite benign in regards to the upper arm   Family medical social history has been reviewed her history and physical on 12/17/2011.   Medications have been reviewed per MAR.Marland Kitchen   Review of systems.   Limited secondary to dementia but per patient does not complaining of shortness of breath or chest pain--is not complaining of any arm pain specifically.   Nursing staff has not noted any fever chills-.   Physical exam.   Temperature 98.4 pulse 90 respirations 18 blood pressure 140/72 O2 saturations have been in the 90s on room air-did get into the 80s yesterday but his head of bed was elevated and apparently this quickly improved into the 90s.   In general this is a  frail elderly male who appears to be at his baseline.--Sitting comfortably in his wheelchair   The skin is warm and dry.   Oropharynx is clear mucous membranes moist.   Chest-he has some diffuse coarse breath sounds--this is somewhat improved from previous exam--there is no labored breathing.   Heart is regular rate and rhythm with occasional irregular beats he has trace lower extremity edema I do note low left arm h as 2+ edema this is currently  nonerythematous nontender radial pulse is palpable--I do not really note any significant edema of the upper arm-it is nontender nonerythematous-basically unchanged exam from previous exam earlier this week   He does have it elevated somewhat on the wheelchair with the support r   .   Extremities as noted above   at baseline ambulates in a wheelchair.  Marland Kitchen Neurologic-grossly intact his speech is clear continues to be confused but pleasant which is his baseline   Labs.   07/26/2012.   WBC 6.5 hemoglobin 11.0 platelets 154.   Sodium 140 potassium 4 BUN 24 creatinine 1.16 liver function tests within normal limits except albumin of 3.2.   Assessment and plan.        #1-edema left arm--DVT-new diagnosis-ultrasound showed a short segment DVT in the left brachial vein at mid  humerus level with minimal thrombus burden-he continues on aspirin-- Will discontinue aspirin and start Xeralto secondary to new diagnosis DVT--this plan was discussed with Dr. Leanord Hawking via  phone  #2-cough-chest congestion-this appears to be slowly improving he is on duo nebs as well as Mucinex and Avelox continue to monitor do not see any sign of distress here--continues to be vulnerable with history of COPD--suspect some element of  bronchitis here      [id number]--of note 30 minutes spent assessing patient reviewing the studies-and formulating a plan of care-including discussing this with his son in the facility as well as nursing staff

## 2012-09-30 ENCOUNTER — Non-Acute Institutional Stay (SKILLED_NURSING_FACILITY): Payer: Medicare Other | Admitting: Internal Medicine

## 2012-09-30 DIAGNOSIS — R609 Edema, unspecified: Secondary | ICD-10-CM

## 2012-09-30 DIAGNOSIS — J441 Chronic obstructive pulmonary disease with (acute) exacerbation: Secondary | ICD-10-CM

## 2012-09-30 DIAGNOSIS — R05 Cough: Secondary | ICD-10-CM

## 2012-09-30 NOTE — Progress Notes (Signed)
Patient ID: [name], male   DOB: Feb 17, 1918, 79 y.o.   MRN: [id number]  This is an acute visit.  Facility is [facility]  Level of care skilled.  Chief complaint - followup cough and congestion.  History of present illness.  Patient is a pleasant elderly resident who was seen last week for a cough with chest congestion-he does have a history of COPD Chest x-ray showed progression of by basilar air space disease most likely atelectasis-also cardiac enlargement with mild vascular congestion.  He continues to have a cough productive of some green phlegm.  He was started on a seven-day course of Levaquin and is completing that also has duo nebs when necessary and has just completed a course of Mucinex  . His vital signs and O2 saturations continued to be stable his oxygen saturation is 97% on room air.  He does not really complain of shortness of breath but his cough is somewhat persistent.  Family medical social history as been reviewed her history and physical on 12/17/2011.  Medications have been reviewed.  Review of systems Limited secondary to dementia please see history of present illness-she's not really complaining of shortness of breath continues to cough with some chest congestion.  Physical exam.  Temperature 97.6 pulse 70 respirations 20 blood pressure 132/60-O2 saturation 97% on room air.  General this is a pleasant elderly male in no distress sitting comfortably in his wheelchair.  Skin is warm and dry he does have some edema of his left elbow left arm edema appears improved from last week.  Eyes-sclera and conjunctiva are clear I do not note any discharge.  Oropharynx is clear mucous membranes moist  Chest he has some mild diffuse coarse breath sounds there is no labored breathing.  He does have reduced air entry which is not new.  Heart is irregular irregular rate and rhythm without murmur gallop or rub.  Labs.  07/26/2012.  [id number].  WBC  6.5 hemoglobin 11.0 platelets 154.  Sodium 140 potassium 4.0 BUN 24 creatinine 1.16.  Assessment and plan.  #1-question bronchitis-with history COPD-chest x-ray was not alarming but he continued with some green phlegm Will extend course of Levaquin for a 10 day course and monitor-also make duo nebs routine for 5 additional days and then back to when necessary-Will extend Mucinex course for 3 additional days as well  . #2-left arm edema-this appears to be improved he was diagnosed with a small DVT left brachial vein-he has been started on Xeralto  Does not complaining of any pain this appears to be stable.  [id number]

## 2012-10-04 LAB — GLUCOSE, CAPILLARY: Glucose-Capillary: 88 mg/dL (ref 70–99)

## 2012-10-05 ENCOUNTER — Ambulatory Visit (HOSPITAL_COMMUNITY)
Admit: 2012-10-05 | Discharge: 2012-10-05 | Disposition: A | Discharge status: Home / Self Care | Payer: Medicare Other | Source: Skilled Nursing Facility | Attending: Internal Medicine | Admitting: Internal Medicine

## 2012-10-05 DIAGNOSIS — IMO0001 Reserved for inherently not codable concepts without codable children: Secondary | ICD-10-CM | POA: Insufficient documentation

## 2012-10-05 DIAGNOSIS — R1311 Dysphagia, oral phase: Secondary | ICD-10-CM | POA: Insufficient documentation

## 2012-10-05 DIAGNOSIS — J4489 Other specified chronic obstructive pulmonary disease: Secondary | ICD-10-CM | POA: Insufficient documentation

## 2012-10-05 DIAGNOSIS — J449 Chronic obstructive pulmonary disease, unspecified: Secondary | ICD-10-CM | POA: Insufficient documentation

## 2012-10-05 DIAGNOSIS — R1310 Dysphagia, unspecified: Secondary | ICD-10-CM | POA: Insufficient documentation

## 2012-10-05 NOTE — Procedures (Signed)
Objective Swallowing Evaluation: Modified Barium Swallowing Study   Patient Details  Name: [name] MRN: [id number] Date of Birth: 1917/07/31  Today's Date: 10/05/2012 Time:  -     Past Medical History:  Past Medical History  Diagnosis Date  . COPD (chronic obstructive pulmonary disease)   . Shortness of breath   . Asthma   . DEMENTIA     short term  . Hypertension   . Arthritis   . Hypothyroidism   . CHF (congestive heart failure)   . BPH (benign prostatic hyperplasia)   . Hiatal hernia   . Neuropathy    Past Surgical History:  Past Surgical History  Procedure Laterality Date  . Repair knee ligament     HPI:  Mr. [name] is a 79 yo resident at [facility] who was referred by Dr. Leanord Hawking for MBSS. Mr. [name] is known to me from previous [facility] admission in July 2013 with MBSS completed (D3/NTL). He has mild dementia.  Symptoms/Limitations Symptoms: Pt has been consuming mech soft with nectar-thick liquids since MBSS in July 2013 Special Tests: MBSS  Recommendation/Prognosis  Clinical Impression Dysphagia Diagnosis: Mild oral phase dysphagia;Moderate oral phase dysphagia;Moderate pharyngeal phase dysphagia;Suspected primary esophageal dysphagia Swallow Evaluation Recommendations Diet Recommendations: Dysphagia 3 (Mechanical Soft);Honey-thick liquid Liquid Administration via: Cup Medication Administration: Whole meds with puree Supervision: Staff feed patient;Full supervision/cueing for compensatory strategies Compensations: Slow rate;Small sips/bites;Multiple dry swallows after each bite/sip;Clear throat intermittently;Effortful swallow Postural Changes and/or Swallow Maneuvers: Out of bed for meals;Seated upright 90 degrees;Upright 30-60 min after meal Oral Care Recommendations: Oral care BID Other Recommendations: Order thickener from pharmacy;Clarify dietary restrictions Follow up Recommendations: Skilled Nursing facility   Individuals Consulted Consulted and Agree  with Results and Recommendations: Patient Report Sent to : Referring physician;Primary SLP  SLP Assessment/Plan Dysphagia Diagnosis: Mild oral phase dysphagia;Moderate oral phase dysphagia;Moderate pharyngeal phase dysphagia;Suspected primary esophageal dysphagia   General:  Date of Onset: 12/08/11 HPI: Mr. [name] is a 79 yo resident at [facility] who was referred by Dr. Leanord Hawking for MBSS. Mr. [name] is known to me from previous [facility] admission in July 2013 with MBSS completed (D3/NTL). He has mild dementia. Type of Study: Modified Barium Swallowing Study Reason for Referral: Objectively evaluate swallowing function Previous Swallow Assessment: 12/09/2011: D3/NTL Diet Prior to this Study: Dysphagia 3 (soft);Nectar-thick liquids Temperature Spikes Noted: No Respiratory Status: Room air History of Recent Intubation: No Behavior/Cognition: Alert;Cooperative;Pleasant mood Oral Cavity - Dentition: Adequate natural dentition (missing molars) Oral Motor / Sensory Function: Within functional limits Self-Feeding Abilities: Needs assist (tremor) Patient Positioning: Upright in chair Baseline Vocal Quality: Clear Volitional Cough: Strong Volitional Swallow: Able to elicit Anatomy: Within functional limits Pharyngeal Secretions: Not observed secondary MBS  Reason for Referral:  Objectively evaluate swallowing function   Oral Phase Oral Preparation/Oral Phase Oral Phase: Impaired Oral - Honey Oral - Honey Cup: Holding of bolus;Lingual/palatal residue;Delayed oral transit Oral - Nectar Oral - Nectar Teaspoon: Holding of bolus;Lingual/palatal residue;Delayed oral transit Oral - Nectar Cup: Holding of bolus;Lingual/palatal residue;Delayed oral transit Oral - Nectar Straw: Holding of bolus;Delayed oral transit;Lingual/palatal residue Oral - Solids Oral - Puree: Lingual pumping;Holding of bolus;Delayed oral transit Oral - Regular: Delayed oral transit Oral - Pill: Within functional  limits Pharyngeal Phase  Pharyngeal Phase Pharyngeal Phase: Impaired Pharyngeal - Honey Pharyngeal - Honey Cup: Delayed swallow initiation;Premature spillage to valleculae;Reduced epiglottic inversion;Reduced pharyngeal peristalsis;Reduced tongue base retraction;Reduced anterior laryngeal mobility;Pharyngeal residue - valleculae;Lateral channel residue Pharyngeal - Nectar Pharyngeal - Nectar Teaspoon: Delayed swallow initiation;Premature spillage  to valleculae;Reduced epiglottic inversion;Reduced pharyngeal peristalsis;Reduced anterior laryngeal mobility;Reduced laryngeal elevation;Reduced airway/laryngeal closure;Penetration/Aspiration during swallow;Pharyngeal residue - valleculae;Pharyngeal residue - pyriform sinuses;Lateral channel residue Penetration/Aspiration details (nectar teaspoon): Material enters airway, CONTACTS cords then ejected out;Material does not enter airway Pharyngeal - Nectar Cup: Delayed swallow initiation;Premature spillage to valleculae;Premature spillage to pyriform sinuses;Reduced airway/laryngeal closure;Reduced laryngeal elevation;Reduced anterior laryngeal mobility;Reduced epiglottic inversion;Reduced pharyngeal peristalsis;Reduced tongue base retraction;Penetration/Aspiration before swallow;Penetration/Aspiration during swallow;Moderate aspiration;Pharyngeal residue - valleculae;Pharyngeal residue - pyriform sinuses;Inter-arytenoid space residue;Lateral channel residue;Pharyngeal residue - cp segment Penetration/Aspiration details (nectar cup): Material does not enter airway;Material enters airway, remains ABOVE vocal cords then ejected out;Material enters airway, passes BELOW cords without attempt by patient to eject out (silent aspiration) Pharyngeal - Nectar Straw:  (essentially same as NTL cup) Pharyngeal - Solids Pharyngeal - Puree: Delayed swallow initiation;Premature spillage to valleculae;Reduced epiglottic inversion;Reduced anterior laryngeal mobility;Reduced  laryngeal elevation;Reduced tongue base retraction;Pharyngeal residue - valleculae Pharyngeal - Regular: Delayed swallow initiation;Premature spillage to valleculae;Reduced epiglottic inversion;Reduced anterior laryngeal mobility;Reduced laryngeal elevation;Reduced tongue base retraction Pharyngeal - Pill: Within functional limits Cervical Esophageal Phase  Cervical Esophageal Phase Cervical Esophageal Phase: Impaired Cervical Esophageal Phase - Nectar Nectar Cup: Other (Comment) (delayed transit)  Thank you,  Havery Moros, CCC-SLP [phone]   Asheton Viramontes 10/05/2012, 6:04 PM

## 2012-10-18 LAB — GLUCOSE, CAPILLARY

## 2012-10-25 LAB — GLUCOSE, CAPILLARY: Glucose-Capillary: 102 mg/dL — ABNORMAL HIGH (ref 70–99)

## 2012-11-01 LAB — GLUCOSE, CAPILLARY: Glucose-Capillary: 95 mg/dL (ref 70–99)

## 2012-11-08 LAB — GLUCOSE, CAPILLARY: Glucose-Capillary: 94 mg/dL (ref 70–99)

## 2012-11-10 ENCOUNTER — Ambulatory Visit (HOSPITAL_COMMUNITY)
Admit: 2012-11-10 | Discharge: 2012-11-10 | Disposition: A | Discharge status: Home / Self Care | Payer: Medicare Other | Attending: Internal Medicine | Admitting: Internal Medicine

## 2012-11-10 ENCOUNTER — Non-Acute Institutional Stay (SKILLED_NURSING_FACILITY): Payer: Medicare Other | Admitting: Internal Medicine

## 2012-11-10 DIAGNOSIS — I517 Cardiomegaly: Secondary | ICD-10-CM | POA: Insufficient documentation

## 2012-11-10 DIAGNOSIS — R079 Chest pain, unspecified: Secondary | ICD-10-CM | POA: Insufficient documentation

## 2012-11-10 DIAGNOSIS — J069 Acute upper respiratory infection, unspecified: Secondary | ICD-10-CM

## 2012-11-10 NOTE — Progress Notes (Signed)
Patient ID: [name], male   DOB: 20-Feb-1918, 79 y.o.   MRN: [id number]  This is an acute visit.  Level of care skilled.  Facility [facility].  Chief complaint-acute visit secondary to respiratory congestion low-grade temperature.  History of present illness.  Patient is a pleasant elderly resident with a history of significant dementia with some psychosis also a history of COPD as well as CHF.  Apparently earlier today patient developed some respiratory congestion a low-grade temperature as well as slight oxygen desaturation to 90% on room air-nursing staff stated he also appeared  slightly warm to touch  Patient is a poor historian secondary to dementia but was not overtly complaining of shortness of breath or distress or chest pain.  He does have a history COPD is on when necessary nebulizer treatments.  Versus CHF he does continue on Lasix as well.  Family medical social history has been reviewed per history and physical on 12/17/2011.  Medications have been reviewed per MAR.  Review of systems this is limited secondary to dementia-he is not overtly complaining of chest pain or shortness of breath-no complaints of abdominal pain.  Physical exam.  Temperature-100.5-pulse 72 respirations 20 blood pressure 153/73-O2 saturation initially 90% after nebulizer treatment rose to 93% on room air .  In general this is a frail elderly male lying in bed he appears quite weak but not in distress.  His skin is warm slightly increase from baseline-he is not overtly diaphoretic.  Chest he does have scattered coarse breath sounds on expiration-there is no labored breathing his first coronary rate is 20 and regular.  Heart is irregular irregular rate and rhythm without murmur gallop rub.  Abdomen is soft nontender with positive bowel sounds.  GU-I could not really appreciate suprapubic tenderness although again patient is somewhat poor historian.  Muscle skeletal-general frailty does move  all extremities x4 he has mild lower extremity edema bilaterally this is baseline.  Psych he is oriented to self only continues to be pleasant again appears quite weak area  Labs.  07/26/2012.  WBC 6.5 hemoglobin 11.0 platelets 154.  Sodium 140 potassium 4 BUN 24 creatinine 1.16.  Liver function tests within normal limits except albumin of 3.2.  Assessment and plan.  Number -- suspected URI-Will obtain a chest x-ray-make duo nebs routine every 6 hours for 48 hours and then when necessary-also will obtain a CBC with differential and basic metabolic panel.  Also will start Levaquin 500 mg x1 dose and then 250 mg daily for 9 additional days-also would give probiotic twice a day during that time.  -Monitor vital signs pulse ox every shift for 72 hours.    [id number] note 30 minutes spent assessing patient-discussing his status with nursing staff-and formulating plan of care

## 2012-11-11 ENCOUNTER — Non-Acute Institutional Stay (SKILLED_NURSING_FACILITY): Payer: Medicare Other | Admitting: Internal Medicine

## 2012-11-11 DIAGNOSIS — F29 Unspecified psychosis not due to a substance or known physiological condition: Secondary | ICD-10-CM

## 2012-11-11 DIAGNOSIS — E039 Hypothyroidism, unspecified: Secondary | ICD-10-CM

## 2012-11-11 DIAGNOSIS — J069 Acute upper respiratory infection, unspecified: Secondary | ICD-10-CM

## 2012-11-11 DIAGNOSIS — I82402 Acute embolism and thrombosis of unspecified deep veins of left lower extremity: Secondary | ICD-10-CM

## 2012-11-11 DIAGNOSIS — F03918 Unspecified dementia, unspecified severity, with other behavioral disturbance: Secondary | ICD-10-CM

## 2012-11-11 DIAGNOSIS — G609 Hereditary and idiopathic neuropathy, unspecified: Secondary | ICD-10-CM

## 2012-11-11 DIAGNOSIS — N400 Enlarged prostate without lower urinary tract symptoms: Secondary | ICD-10-CM

## 2012-11-11 DIAGNOSIS — I82409 Acute embolism and thrombosis of unspecified deep veins of unspecified lower extremity: Secondary | ICD-10-CM

## 2012-11-11 DIAGNOSIS — F0391 Unspecified dementia with behavioral disturbance: Secondary | ICD-10-CM

## 2012-11-11 DIAGNOSIS — I5032 Chronic diastolic (congestive) heart failure: Secondary | ICD-10-CM

## 2012-11-11 NOTE — Progress Notes (Signed)
Patient ID: [name], male   DOB: November 10, 1917, 79 y.o.   MRN: [id number] Chief complaint-medical management of COPD atrial fibrillation dementia with delusions psychosis-edema-hypothyroidism-urinary retention-constipation--- acute visit followup respiratory issues .  History of present illness.  Patient is a pleasant elderly resident with the above diagnoses.  Patient was seen yesterday for low-grade temperature increased chest congestion-we have made dual nebs repeat team also started Mucinex and Levaquin-this appears to be improved today-a chest x-ray showed a right basilar airspace opacity question pneumonia asymmetric-atypical pulmonary edema or aspiration pneumonitis- Again he appears to be generally at baseline today with no sign of distress or complaints of shortness of breath vital signs have been stable Patient also continues on Xeralto  for recently diagnosed left brachial vein DVT that apparently was quite small-he continues with some baseline left arm edema does not really complain of pain with this.  He also has had some history of psychosis was started on Risperdal for some delusions some sexual nature-these apparently have largely resolved.   .    His other issues appear to be relatively stable.  -He does have a history of a thyroid mass and ultrasound showing multi-nodular goiter without malignancy.  This has been relatively stable.  Today o he has no complaints .  Family medical social history has been reviewed.   Medications have been reviewed per MAR.   Review of systems this is somewhat limited secondary to dementia.  In general no fever or chills noted initially has lost some weight but this has stabilizedsince April.  Respiratory history COPD but did not complain of shortness of breath or cough today.  Cardiac-no complaints of chest pain.  Muscle skeletal-does complain at times of some left hand discomfort there has been some edema this comes and goes.  Otherwise  denies joint pain.  Neurologic-history of neuropathy but does not complaining of pain today or headache or dizziness.  Psych-as stated above history of dementia complicated with psychosis .  Physical exam.   Temperature is 97.4 pulse 72 respirations 20 blood pressure 127/66-O2 saturation 95% on room air.  .  In general this is a frail elderly male in no distress sitting comfortably in his wheelchair.  The skin is warm and dry.  Eyes pupils equal round react to light sclerae and conjunctivae are clear he does wear prescription lenses.  Oropharynx clear mucous membranes moist.  Chest-he does have reduced breath sounds but no rhonchi rales or wheezes no labored breathing.  Heart is irregular irregular rate and rhythm without murmur gallop or rub--he has some mild lower extremity edema this is not increased from baseline---no really significant sacral edema  Muscle skeletal-does have some edema of his left hand lower arm-the edema is flesh-colored.  It is cool to touch.  Neurologic grossly intact his speech is clear.  Psyche is oriented to self-- follows simple verbal commands without difficulty .  Labs. 11/10/2012.  WBC 7.5 hemoglobin 11.3 platelets 156.  Sodium 138 potassium 3.6 BUN 26 creatinine 1.05   07/26/2012 .  NGE-[id number].  WBC 6.5 hemoglobin 11.0 platelets 154. Her  Sodium 140 potassium 4.0 BUN 24 creatinine 1.16-liver function tests within normal limits except albumin of 3.2   07/15/2012.  Digoxin-1.09.  Marland Kitchen  [id number] .  Assessment and plan.  #1-URI-continues on Levaquin duo nebs Mucinex this appears to be stable he has responded well to this treatment for r.  #2-CHF--edema---this appears to be stable he is on Lasix with potassium--I do not see signs of any worsening here---no  increased edema or increased shortness of breath I suspect what occurred yesterday was more URI related. - #3-COPD-this appears stable treatment as noted above.  #4-hypothyroidism-this appears to  be stable on Synthroid recent TSH was within normal limits.  #5s atrial fibrillation-at this point rate control he is on Xeralto for anticoagulation continues on digoxin as well for rate control.  #6-dementia with psychosis-this appears to have stabilized he is on Risperdal twice a day this is followed by psych services as well.--He continues on Depakote as well  #7 neuropathy---this is stable on Neurontin.  #8-history urinary retention-this appears to be stable on Flomax.  #9-depression-continues on Paxil this again is followed by psych services.  #10 -- history constipation-at this point stable on Senokot.  #11-history small DVT left arm-again he is on anticoagulation this appears to be at baseline #12-anemia-this appears to be relatively mild- #13-dementia-this appears to be slowly progressing at this point he appears to be doing relatively well in this setting with supportive care   [phone] --of note 30 minutes spent assessing patient-and formulating plan of care for numerous diagnoses .

## 2012-11-17 ENCOUNTER — Non-Acute Institutional Stay (SKILLED_NURSING_FACILITY): Payer: Medicare Other | Admitting: Internal Medicine

## 2012-11-17 DIAGNOSIS — R339 Retention of urine, unspecified: Secondary | ICD-10-CM

## 2012-11-17 NOTE — Progress Notes (Signed)
Patient ID: [name], male   DOB: [birth year], 78 y.o.   MRN: [id number] Facility; [facility] Chief complaint; has not voided today. History; [name] is a long-standing resident here. I believe he has a history of BPH with some history of urinary retention for he is on Flomax 0.8, daily. He was noted by his nursing assistant today to look not voided on this shift. He is incontinent and wears incontinence garments. Therefore, it seems unlikely that this was not accurate.  Review of systems; Respiratory no cough no sputum. Cardiac no chest pain. GU patient is not complaining of dysuria and really seems unaware of his voiding difficulties.  Medication list is reviewed. He is on Cardia exam 120 daily, Paxil 10 mg combined with 40 mg to make 50 mg as the only medications that could be contributing to do this.  Physical exam. Cardiac heart sounds are normal. He does not appear to be dehydrated. Respiratory clear entry bilaterally. Abdomen no liver no spleen no tenderness. GU probable bladder distention to the level of the umbilicus, but not particularly tender. There is no costovertebral angle tenderness.  Impression/plan #1 urinary retention, probably secondary to BPH. I will check a urine culture. He will need to be fully catheterize if there is more than 400 cc the catheter will need to be left in. I believe there is history here. However, I didn't put my hands on and easily. As mentioned, he is on Flomax.

## 2012-11-22 ENCOUNTER — Non-Acute Institutional Stay (SKILLED_NURSING_FACILITY): Payer: Medicare Other | Admitting: Internal Medicine

## 2012-11-22 DIAGNOSIS — R339 Retention of urine, unspecified: Secondary | ICD-10-CM

## 2012-11-22 LAB — GLUCOSE, CAPILLARY: Glucose-Capillary: 97 mg/dL (ref 70–99)

## 2012-11-23 ENCOUNTER — Non-Acute Institutional Stay (SKILLED_NURSING_FACILITY): Payer: Medicare Other | Admitting: Internal Medicine

## 2012-11-23 DIAGNOSIS — K5900 Constipation, unspecified: Secondary | ICD-10-CM

## 2012-11-23 DIAGNOSIS — N400 Enlarged prostate without lower urinary tract symptoms: Secondary | ICD-10-CM

## 2012-11-27 NOTE — Progress Notes (Signed)
Patient ID: [name], male   DOB: Sep 26, 1917, 79 y.o.   MRN: [id number]  Acute visit  LOC-skilled  Fac-[facility]  CC-AV sec to constipation...urinary retention---  HPI--Patient  is a pleasant elderly resident.  He recently had a Foley catheter inserted secondary to issues with urinary retention-he appears to be tolerating this fairly well.  He has a history of BPH and is on Flomax 0.8 mg a day.  A urine culture was obtained which really did not grow out anything  Another issue has been constipation apparently has not had a bowel movement in several days  He is on Senokot 4 tabs each bedtime-apparently this has not been a huge issue in the past-he is not complaining of any abdominal pain nursing staff has not noted any complaints of nausea there's been no vomiting.  Family medical social history has been reviewed.  Medications have been reviewed per MAR.  Review of systems.  Limited secondary to dementia.  General no fever chills noted.  Respiratory history COPD but no complaints of increased shortness of breath or cough.  Cardiac-no complaints of chest pain.  GI-as stated in history of present illness.  GU-no complaints of dysuria does have an indwelling Foley catheter.  Muscle skeletal does not complaining of joint pain today does have some history of left arm edema with history of DVT but this has been relatively stable.  Physical exam.  Temperature is 98.5 pulse 70 respirations 16 blood pressure 150/82.  In general this is a frail elderly male in no distress sitting comfortably in his wheelchair later did evaluate him in bed as well.  The skin is warm and dry.  Chest is clear to auscultation with somewhat reduced breath sounds no labored breathing.  Abdomen soft nontender with active bowel sounds.  Extremities moves all times for does have baseline left arm edema he has is elevated in a wheelchair-he has baseline lower extremity edema.  GU-he does have a Foley  catheter draining amber colored urine.  -I. could not appreciate any suprapubic tenderness or distention.  Labs.  11/18/2012.  Sodium 140 potassium 3.7 BUN 25 creatinine 0.98.  Urine culture did not show any growth.  11/10/2012.  WBC 7.5 hemoglobin 11.3 platelets 156.  Sodium 138 potassium 3.6 BUN 26 creatinine 1.05.  Assessment and plan.  #1-history of urinary retention-I spoke with the son about this at this point will keep catheter in but will encourage possible trial discontinuance next week to see how he does urine culture was negative he does not appear the etiology of this is infectious.  #2-constipation-this appears to be somewhat intermittent we'll order a [employer] tonight if this persists certainly will have to readdress-he is on Senokot 4 tabs each bedtime apparently this is usually effective  [id number]

## 2012-11-28 ENCOUNTER — Telehealth: Payer: Self-pay | Admitting: Internal Medicine

## 2012-11-28 ENCOUNTER — Ambulatory Visit (HOSPITAL_COMMUNITY): Payer: Medicare Other | Attending: Internal Medicine

## 2012-11-28 DIAGNOSIS — M25569 Pain in unspecified knee: Secondary | ICD-10-CM | POA: Insufficient documentation

## 2012-11-29 ENCOUNTER — Other Ambulatory Visit: Payer: Self-pay | Admitting: Geriatric Medicine

## 2012-11-29 LAB — GLUCOSE, CAPILLARY: Glucose-Capillary: 106 mg/dL — ABNORMAL HIGH (ref 70–99)

## 2012-11-29 MED ORDER — ALPRAZOLAM 0.25 MG PO TABS: ORAL_TABLET | ORAL | Status: AC

## 2012-12-01 ENCOUNTER — Non-Acute Institutional Stay (SKILLED_NURSING_FACILITY): Payer: Medicare Other | Admitting: Internal Medicine

## 2012-12-01 DIAGNOSIS — R339 Retention of urine, unspecified: Secondary | ICD-10-CM

## 2012-12-01 DIAGNOSIS — I5032 Chronic diastolic (congestive) heart failure: Secondary | ICD-10-CM

## 2012-12-01 DIAGNOSIS — N400 Enlarged prostate without lower urinary tract symptoms: Secondary | ICD-10-CM

## 2012-12-03 ENCOUNTER — Non-Acute Institutional Stay (SKILLED_NURSING_FACILITY): Payer: Medicare Other | Admitting: Internal Medicine

## 2012-12-03 DIAGNOSIS — R52 Pain, unspecified: Secondary | ICD-10-CM

## 2012-12-03 DIAGNOSIS — N400 Enlarged prostate without lower urinary tract symptoms: Secondary | ICD-10-CM

## 2012-12-03 DIAGNOSIS — R627 Adult failure to thrive: Secondary | ICD-10-CM

## 2012-12-03 DIAGNOSIS — G309 Alzheimer's disease, unspecified: Secondary | ICD-10-CM

## 2012-12-03 DIAGNOSIS — F0280 Dementia in other diseases classified elsewhere without behavioral disturbance: Secondary | ICD-10-CM

## 2012-12-03 NOTE — Progress Notes (Signed)
Patient ID: [name], male   DOB: 12-10-1917, 79 y.o.   MRN: [id number]  This is an acute visit.  Local care skilled.  Facility [facility]  Chief complaint-acute visit secondary to failure to thrive-pain issues.  History of present illness.  Patient is an elderly resident with a history of progressing dementia.  He has somewhat spotty fluid intake-and nursing staff feels he's not drinking enough.  They also feel he is an aspiration risk he does have a history of this and would like his diet change to Pureed  He also feel he has some chronic pain and would like his tramadol made routine instead of when necessary which I think is a good idea.  His vital signs continued to be stable he has no acute complaints today again he is a poor historian secondary to dementia.  Family medical social history as been reviewed.  Medications have been reviewed per MAR.  Review of systems.  Somewhat limited.  General he is denying any fever or chills.  Respiratory does not complaining of cough does have a history of COPD.  Cardiac-does not complaining of chest pain.  GI-does not complaining of abdominal pain.  GU-does have a history of BPH he has a catheter placed secondary to urinary retention issues he is on Flomax.  Muscle skeletal-does complain at times of diffuse joint pain.  Neurologic history of dementia does not complaining of headache or dizziness.  Psych-as stated above does have a history of dementia which appears to be progressing.  Physical exam.  Temperature is 97.1 pulse 57 respirations 18 blood pressure 137/61.  In general this is a frail elderly male in no distress lying comfortably in a geri-chair  Skin is warm and dry.  Skin turgor does not appear grossly impaired.  Oral pharynx is clear mucous membranes appear fairly moist.  Chest-does have  scattered coarse breath sounds that improve after he coughs.  There is no labored breathing.  Heart-is irregular  irregular rate and rhythm without murmur gallop or rub he has trace lower extremity edema which is baseline.  Abdomen is soft nontender with positive bowel sounds.  Muscle skeletal does have general frailty has some edema of his left arm with a history of DVT here this actually appears to be improved-general frailty but no deformities noted other than arthritic changes in the joints.  GU-Foley catheter in place with dark colored urine  Neurologic is grossly intact his speech is clear.  Psych he is oriented to self is conversant certainly alert today.  Labs 12/01/2012.  Sodium 139 potassium 4 BUN 23 creatinine 0.95.  Assessment and plan.  #1-dementia that is progressing with somewhat poor fluid intake-we'll empirically start IV normal saline low flow at 50 cc an hour for 2 L also update metabolic panel on Monday-continue to encourage fluids and by mouth intake.  Of note we'll change his diet to pureed  This plan of care was discussed with his son via phone as well  Also will minimize his medications somewhat -- vitamins as well as potassium-his Lasix has already been held  #2-pain management-since he does require the tramadol regularly will make this routine 3 times a day and maintain the Tylenol for breakthrough pain every 4 hours.    #3-urinary retention --continue the catheter secondary to significant BPH also on Flomax  [id number]--

## 2012-12-05 DIAGNOSIS — R627 Adult failure to thrive: Secondary | ICD-10-CM | POA: Insufficient documentation

## 2012-12-05 DIAGNOSIS — G309 Alzheimer's disease, unspecified: Secondary | ICD-10-CM | POA: Insufficient documentation

## 2012-12-05 DIAGNOSIS — F0280 Dementia in other diseases classified elsewhere without behavioral disturbance: Secondary | ICD-10-CM | POA: Insufficient documentation

## 2012-12-06 ENCOUNTER — Non-Acute Institutional Stay (SKILLED_NURSING_FACILITY): Payer: Medicare Other | Admitting: Internal Medicine

## 2012-12-06 ENCOUNTER — Other Ambulatory Visit (HOSPITAL_COMMUNITY): Payer: Medicare Other

## 2012-12-06 ENCOUNTER — Ambulatory Visit (HOSPITAL_COMMUNITY)
Admit: 2012-12-06 | Discharge: 2012-12-06 | Disposition: A | Discharge status: Home / Self Care | Payer: Medicare Other | Source: Skilled Nursing Facility | Attending: Internal Medicine | Admitting: Internal Medicine

## 2012-12-06 DIAGNOSIS — M25519 Pain in unspecified shoulder: Secondary | ICD-10-CM | POA: Insufficient documentation

## 2012-12-06 DIAGNOSIS — F0280 Dementia in other diseases classified elsewhere without behavioral disturbance: Secondary | ICD-10-CM

## 2012-12-06 DIAGNOSIS — G309 Alzheimer's disease, unspecified: Secondary | ICD-10-CM

## 2012-12-06 DIAGNOSIS — M25529 Pain in unspecified elbow: Secondary | ICD-10-CM | POA: Insufficient documentation

## 2012-12-06 DIAGNOSIS — M19019 Primary osteoarthritis, unspecified shoulder: Secondary | ICD-10-CM | POA: Insufficient documentation

## 2012-12-06 DIAGNOSIS — R52 Pain, unspecified: Secondary | ICD-10-CM

## 2012-12-06 DIAGNOSIS — M25629 Stiffness of unspecified elbow, not elsewhere classified: Secondary | ICD-10-CM | POA: Insufficient documentation

## 2012-12-06 NOTE — Progress Notes (Signed)
Patient ID: Candie Mile, male   DOB: 1917-12-21, 79 y.o.   MRN: [id number] Facility [facility]. Chief complaint review of declining status.  History Mr. Kolodziejski is a gentleman with multiple medical issues, who I saw 2 weeks ago. He was in your urinary retention also felt he was probably dehydrated. His Lasix on hold. He was given 2 L of IV fluid. Staff report that he is drinking adequately, but eating very little. He also has left arm pain and swelling.  Physical examination. Cardiac he is no longer appeared dehydrated. There is no murmurs. No signs of heart failure. Abdomen no masses. No tenderness. Musculoskeletal he has diffuse swelling of his left arm from mid lower arm to above. His elbow. When I supinate and pronate the arm. He yells and pain, but then points to his left shoulder as the source of the source of this.  Impression/plan #1 urinary retention. I am not going to discontinue the Foley IV. This is a Comfort Care/end-of-life issues. #2 dehydration. We have given him IV fluids and this seems to have perked him out somewhat. I have no plans to do this repetitively. He has a severe dementia, possibly some coexistent delirium

## 2012-12-06 NOTE — Progress Notes (Signed)
Patient ID: [name], male   DOB: 25-Dec-1917, 79 y.o.   MRN: [id number]           PROGRESS NOTE  DATE:  11/22/2012  FACILITY: [facility]   LEVEL OF CARE:   SNF   Acute Visit   CHIEF COMPLAINT:  Follow up urinary retention.    HISTORY OF PRESENT ILLNESS:  Mr. Dady is a gentleman whom I saw last week.  He was noted to be not voiding.  I thought clinically at the bedside that he had urinary retention.  He was catheterized for 800 cc.  He has a history of BPH, on Flomax at 0.8 mg.  If there is more information on this, I cannot easily see it in his record.  He is not on any of the usual medication offenders to cause urinary retention.    PHYSICAL EXAMINATION:   GENITOURINARY:  BLADDER:   Foley catheter in place.   RECTAL:  Examination reveals a minimal prostate (?previous surgery).  I am going to have to review this history with his son to see if there is more about this than I am currently able to access.      ASSESSMENT/PLAN:  DVT.  He is on Xarelto.  He will probably need to be on Xarelto for four or five months.  He appears to be tolerating this well.  CBC and basic metabolic panel have been checked.    Question UTI.  When I put the catheter in last week, I checked a urine culture.  There is no growth.    I will attempt to call his son when I am next in the building to see if we can organize our thoughts on this.  He will probably need a Urology consultation.    CPT CODE: 40981

## 2012-12-07 ENCOUNTER — Non-Acute Institutional Stay (SKILLED_NURSING_FACILITY): Payer: Medicare Other | Admitting: Internal Medicine

## 2012-12-07 DIAGNOSIS — R627 Adult failure to thrive: Secondary | ICD-10-CM

## 2012-12-07 DIAGNOSIS — H109 Unspecified conjunctivitis: Secondary | ICD-10-CM

## 2012-12-07 NOTE — Progress Notes (Signed)
Patient ID: [name], male   DOB: Nov 30, 1917, 79 y.o.   MRN: [id number]  This is an acute visit.  Level of care skilled.  Facility [facility].  Chief complaint- acute visit followup failure to thrive.  History of present illness.  Patient is a pleasant elderly resident who has been gradually declining here the past several weeks He has a history of progressive dementia along with some psychosis.  I did speak with his son last week and we discontinued numerous medications--he does continues on his Risperdal as well as Paxil-also on Flomax with a history of urinary retention he does have Foley catheter placement-his Lasix was discontinued last week as well as potassium-there were concerns he was getting dry   he did receive some IV fluids and labs appear to be fairly unremarkable.  However the past several days he appears to be somewhat increasingly confused-- delirious at times talking about people who have long since passed away- this is a change from several days ago- He also was drooling more-it appears his decline has started to hasten.  Last week we also ordered Ultram twice a day routine for pain which was generalized more so his left arm and shoulder and Dr. Leanord Hawking did order an x-ray of this area results are pending.  Nursing staff and family have also noted he is drooling somewhat more we will order suctioning  His vital signs continued to be stable although at times apparently he does appear to be in pain according to nursing staff and family.  Family medical social history has been reviewed. Per history and physical on 12/17/2011.  Medications have been reviewed per MAR.  Review of systems-very limited secondary to dementia-apparently does have pain at times is more the left arm and shoulder and x-rays pending.  It is not specifically complain of any chest pain or shortness of breath has occasional cough.  Physical exam.  Temperature is 99.4 pulse 79 respirations 20 blood  pressure 137/66 O2 saturation is low 90s.  General this is a frail elderly male appears that he is declining-more confused but he is responsive.  Skin is warm and dry.  Eyes he does have somewhat erythematous conjunctiva with a small amount of yellow grayish exudate left lower lid margins.  Visual acuity appears intact pupils equal round reactive to light.  Oropharynx is clear mucous membranes appear fairly moist.  Chest somewhat reduced breath sounds and no labored breathing does have small amount of rhonchi in the right lung field.  There is somewhat poor respiratory effort.  Heart is regular rate and rhythm with some irregular beats he has baseline mild lower extremity edema continues with some edema of his left arm although this appears certainly improved from what it was several weeks ago.   abdomen is soft does not appear to be tender there are positive bowel sounds.  GU-does have Foley catheter draining what appears to be some slightly blood-tinged urine.  Musculoskeletal does have limited range of motion of his left shoulder I do not know deformity however has some edema of his left arm does have some discomfort with movement of his left arm but appears to be somewhat improved compared to what it was yesterday.  Neurologic his speech is clear-he does move all his extremities at baseline although limited exam since patient is not moving much.  Psych-he is oriented to self at times will speak somewhat coherently and then start quoting Bible verses or talking incoherently--he is a retired Optician, dispensing.  Labs.  12/06/2012.  Sodium 136 potassium 3.8 BUN 16 creatinine 0.73.  11/19/2012.  Urine culture showed no growth.  11/10/2012.  WBC 7.5 hemoglobin 11.3 platelets 156.  Sodium 138 potassium 3.6 BUN 26 creatinine 1.05.  . 11/10/2012.  Chest x-ray showed left greater than right basilar airspace opacity question pneumonia atypical pulmonary edema or aspiration  pneumonitis-he did complete a course of Levaquin.  Assessment and plan.  #1-failure thrive-I did have a discussion with his responsible party-his son in facility and at bedside-he did express desires for essentially comfort care-at this point not sure that the Ultram is totally effective Will start morphine when necessary low dose 5 mg every 4 hours and monitor this this was discussed with his son as well as Education administrator.  I do note he now has a very low-grade temperature-I did discuss this with the son via phone-at this point his son is content to just monitor this-Will write order for vital signs pulse ox every shift notify provider if temperature rises above 100.0-.  I did reevaluate patient x2-he appears essentially baseline with what he was when I assessed him earlier today-he is not tachycardic does not appear  septic--or any kind of distress certainly-does appear to be declining from several days ago with some increased confusion but he is alert responsive and continues to be pleasant although quite weak appearing  #2-conjunctivitis-we'll treat this with Cipro ophthalmologic solution 2 drops 3 times a day to both eyes x7 days and monitor  [id number]-of note greater than 45 minutes spent assessing and reassessing patient x2-discussing his status with nursing staff-also discussing with his family including his responsible party his son in the room and on the phone-of note greater than 50% of time spent coordinating and formulating a plan of care        -.  Marland Kitchen

## 2012-12-08 ENCOUNTER — Ambulatory Visit (HOSPITAL_COMMUNITY): Payer: Medicare Other | Attending: Internal Medicine

## 2012-12-08 DIAGNOSIS — M25519 Pain in unspecified shoulder: Secondary | ICD-10-CM | POA: Insufficient documentation

## 2012-12-08 DIAGNOSIS — I517 Cardiomegaly: Secondary | ICD-10-CM | POA: Insufficient documentation

## 2012-12-09 ENCOUNTER — Non-Acute Institutional Stay (SKILLED_NURSING_FACILITY): Payer: Medicare Other | Admitting: Internal Medicine

## 2012-12-09 DIAGNOSIS — R627 Adult failure to thrive: Secondary | ICD-10-CM

## 2012-12-09 DIAGNOSIS — F0280 Dementia in other diseases classified elsewhere without behavioral disturbance: Secondary | ICD-10-CM

## 2012-12-09 DIAGNOSIS — D689 Coagulation defect, unspecified: Secondary | ICD-10-CM

## 2012-12-09 NOTE — Progress Notes (Signed)
Patient ID: [name], male   DOB: 04/24/1918, 79 y.o.   MRN: [id number]                    This is an acute visit.   Level of care skilled.   Facility [facility].   Chief complaint- acute visit followup failure to thrive-hematoma .   History of present illness.   Patient is a pleasant elderly resident who has been gradually declining here the past several weeks He has a history of progressive dementia along with some psychosis.  He has been having a significant decline recently-he is essentially on comfort measures we've been trying to minimize his medications.  He has developed some bruising from his thorax now to his right upper arm this had been increasing so lab work was ordered although we are trying to minimize laboratory draws as well as invasive procedures.  The lab is remarkable for elevated INR of 2.34 [name] 24 and PTT of 51.6.  He is on Xeralto for recent history of a left arm DVT-  Today he actually appears to have declined even from 2 days ago when I last assessed him he is still taking his medicines but is having more difficulty swallowing  He also at times does have a low-grade temperature-.  I did speak with his son again via phone this evening--per discussion with the son and nursing staff will significantly reduce his medicines for comfort measures- Will discontinue the Xeralto--secondary to the bruising and abnormal lab work-although I suspect this is more of a systemic issue.  .       Family medical social history has been reviewed. Per history and physical on 12/17/2011.   Medications have been reviewed per MAR.   Review of systems-very limited secondary to dementia  and not really speaking today-apparently does have pain at times but apparently is having more difficulty swallowing, the tramadol.   I.   Physical exam.   T- 98.0 currently pulse 86 respirations 20 blood pressure 95/52 O2 saturation is 93% on 2 L via nasal  cannula.   General this is a frail elderly male --he is responsive but is not really speaking -- this again appears to be a decline from 2 days ago.   Skin is warm and dry--I do note a  hematoma rightt thorax area this is extending down to the right upper arm.   Eyes--somewhat erythematous conjunctiva  not appear to be progressing he is on Cipro eyedrop .   Visual acuity appears intact pupils equal round reactive to light.   Oropharynx is clear mucous membranes appear fairly moist.   Chest somewhat reduced breath --continues with some rhonchi diffusely although there is no labored breathing .   There ist poor respiratory effort.   Heart is regular  irregular beats he has baseline mild lower extremity edema continues with some edema of his left arm although this appears certainly improved from what it was several weeks ago.    abdomen is soft does not appear to be tender there are positive bowel sounds.   GU-does have Foley catheter draining what appears to be some slightly blood-tinged urine  Neurologic-appears to be grossly intact cranial nerves appear to be intact as is somewhat of a limited exam since patient is not really responding at times to verbal commands.       Psych-is not really speaking today this appears to be a decline .   Labs.  12/08/2012.  WBC 7.8 hemoglobin 9.3  platelets 169.  INR 2.34 prothrombin time 24.9-PTT 51.6   12/06/2012.   Sodium 136 potassium 3.8 BUN 16 creatinine 0.73.   11/19/2012.   Urine culture showed no growth.   11/10/2012.   WBC 7.5 hemoglobin 11.3 platelets 156.   Sodium 138 potassium 3.6 BUN 26 creatinine 1.05.   . 11/10/2012.   Chest x-ray showed left greater than right basilar airspace opacity question pneumonia atypical pulmonary edema or aspiration pneumonitis-he did complete a course of Levaquin.   Assessment and plan.   #1-failure thrive--patient continues to decline --this hematoma is  increasing-Will discontinue the Xeralto-as well as his Paxil Risperdal Prozac Flomax Neurontin Prilosec digoxin Cartia Synthroid-patient is having increased difficulty swallowing --appears medication administration is making him uncomfortable-this certainly is not our goal and this was discussed with his son who is in agreement.--At this point workup of a possible coagulopathy would be essentially fruitless considering patient's comorbidities and progressive declining status with emphasis on comfort care  Also would discontinue the Lasix.  Also will write an order so it is clear  there is to be no hospitalization no further labs comfort measures only-.  This plan was discussed with Dr. Leanord Hawking via phon---  He does have when necessary Roxanol 5 mg every 4 hours I have encouraged nursing staff to use this if needed.  Will continue to monitor.  Addendum.  Of note later this evening I did reassess patient he actually appeared to be improved somewhat he did speak to me-still continues to be very weak and frail but somewhat more alert and talking-.    [id number] note 30 minutes spent assessing patient-discussing his status with nursing staff as well as with his son via phone and coordinating and formulating a plan of care                                 .                   -.   .                                                         -.   Marland Kitchen                          N

## 2012-12-10 ENCOUNTER — Non-Acute Institutional Stay (SKILLED_NURSING_FACILITY): Payer: Medicare Other | Admitting: Internal Medicine

## 2012-12-10 DIAGNOSIS — R627 Adult failure to thrive: Secondary | ICD-10-CM

## 2012-12-10 DIAGNOSIS — T148XXA Other injury of unspecified body region, initial encounter: Secondary | ICD-10-CM

## 2012-12-10 NOTE — Progress Notes (Signed)
Patient ID: [name], male   DOB: 09-20-1917, 79 y.o.   MRN: [id number]  This is an acute visit.  Level of care skilled.  Facility [facility].  Chief complaint-acute visit followup failure to thrive-end-of-life issues.  History of present illness.  Patient is a very pleasant elderly male with a history of end-stage dementia and failure to thrive.  He has gradually been declining however this has accelerated here the past week or so His family has expressed desires for comfort care only-with minimal interventions -yesterday most of his medications were discontinued including his Gibson Ramp-- he was on this for a left arm DVT .   However he apparently is experiencing some coagulopathy and has developed a hematoma in the right thorax and right upper arm.  Yesterday he seemed to be declining was more lethargic not eating and drinking.  However today he's  turned for the better he is certainly more alert ate and drank well  His granddaughter is in the room and said he ate very well for supper.  He does continue on Roxanol for pain apparently required that this evening-.  Family medical social history  reviewed.  Medications have been reviewed again these have been minimized.  Review of systems-limited secondary to dementia but patient currently is not complaining of pain shortness of breath-he appears significantly more alert than yesterday.  Physical exam.  Temperature is 98.5 pulse 75 respirations 20 blood pressure 138/80.  In general this is a frail elderly male who appears significantly improved compared to yesterday he is sitting up in bed alert smiling interactive.  His skin is warm and dry hematoma persists right thorax area in right upper arm however this does not appear to be progressing from yesterday.  Chest is clear to auscultation this actually is improved from yesterday when he had some mild congestion-there is no labored breathing.  Heart distant heart sounds regular  rate and rhythm with occasional irregular beats.  Extremities-edema of the left arm appears to be at baseline-he has some mild lower extremity edema which appears to be at baseline as well  Assessment and plan.  Failure to thrive-he actually appears significantly improved from yesterday- his medications have essentially been discontinued except for Roxanol which apparently he did not take real often today-Will continue to monitor--his status still continues to be quite fragile but this has certainly been a good day  Hematoma-contusion-this appears to have stabilized he is off his anticoagulation-recent blood work showed coagulopathy family does not want any workup of this-he continues on comfort care.  [id number]

## 2012-12-13 LAB — GLUCOSE, CAPILLARY: Glucose-Capillary: 98 mg/dL (ref 70–99)

## 2012-12-16 LAB — GLUCOSE, CAPILLARY: Glucose-Capillary: 151 mg/dL — ABNORMAL HIGH (ref 70–99)

## 2012-12-21 ENCOUNTER — Other Ambulatory Visit: Payer: Self-pay | Admitting: Geriatric Medicine

## 2012-12-21 ENCOUNTER — Non-Acute Institutional Stay (SKILLED_NURSING_FACILITY): Payer: Medicare Other | Admitting: Internal Medicine

## 2012-12-21 DIAGNOSIS — R52 Pain, unspecified: Secondary | ICD-10-CM

## 2012-12-21 DIAGNOSIS — R627 Adult failure to thrive: Secondary | ICD-10-CM

## 2012-12-22 ENCOUNTER — Other Ambulatory Visit: Payer: Self-pay | Admitting: Geriatric Medicine

## 2012-12-24 NOTE — Progress Notes (Signed)
Patient ID: [name], male   DOB: 08-Aug-1917, 79 y.o.   MRN: [id number]           PROGRESS NOTE  DATE:  12/01/2012  FACILITY: [facility]   LEVEL OF CARE:   SNF   Acute Visit   CHIEF COMPLAINT:  Urinary retention, inadequate oral intake.    HISTORY OF PRESENT ILLNESS:  I saw [name] on 11/17/2012.  He had been found to be in urinary retention.  He now has a chronic Foley catheter.  I believe he has been in retention before.  He has a history of BPH, on Flomax at 0.8 mg.  His urine culture was negative.    LABORATORY DATA:   Lab work on 11/18/2012 showed a normal basic metabolic panel.    He also had lab work on 11/10/2012 that showed a white count of 7.5, a hemoglobin of 11.3.    PHYSICAL EXAMINATION:   GENERAL APPEARANCE:  The nurses feel this patient is slipping.  I tend to agree.  He is awake, but less verbally responsive.   CARDIOVASCULAR:  CARDIAC:   Heart sounds are normal.  He appears to be more dehydrated than two weeks ago.   GASTROINTESTINAL:  LIVER/SPLEEN/KIDNEYS:  No liver, no spleen.  ABDOMEN:  No masses.   GENITOURINARY:  BLADDER:   Once again, I think there is a suprapubic fullness here.  He is tender here.  There is no costovertebral angle tenderness.   RECTAL:  He has a moderate amount of stool, although this is soft and not likely to be contributing.  PROSTATE:  He has a prostate nodule and what I can feel of his prostate is firm.    ASSESSMENT/PLAN:  Urinary retention.  Probably secondary to BPH or prostate pathology.  I think he needs to have the catheter left in at this point.    Moderate fecal impaction, which I will clear.    He actually looks to be dehydrated.  He is on a pureed diet with nectar-thick.  He may need IV fluids.  His son has already reiterated his desire for comfort care.  I will send lab work off this afternoon.    CPT CODE: 40981

## 2013-01-18 ENCOUNTER — Other Ambulatory Visit: Payer: Self-pay | Admitting: *Deleted

## 2013-01-18 MED ORDER — MORPHINE SULFATE (CONCENTRATE) 20 MG/ML PO SOLN: ORAL | Status: DC

## 2013-01-26 ENCOUNTER — Other Ambulatory Visit: Payer: Self-pay | Admitting: *Deleted

## 2013-01-26 MED ORDER — MORPHINE SULFATE (CONCENTRATE) 20 MG/ML PO SOLN: ORAL | Status: DC

## 2013-01-27 ENCOUNTER — Non-Acute Institutional Stay (SKILLED_NURSING_FACILITY): Payer: Medicare Other | Admitting: Internal Medicine

## 2013-01-27 DIAGNOSIS — G894 Chronic pain syndrome: Secondary | ICD-10-CM | POA: Insufficient documentation

## 2013-01-27 DIAGNOSIS — R627 Adult failure to thrive: Secondary | ICD-10-CM

## 2013-01-27 NOTE — Progress Notes (Signed)
Patient ID: [name], male   DOB: 10-12-1917, 79 y.o.   MRN: [id number]  This is an acute visit.  Level of care skilled.  [facility]ility [facility].  Acute visit secondary to pain management.  History of present illness.  Patient is a pleasant elderly resident essentially under comfort care measures with history of failure to thrive and progressive dementia.  His pain medications have been minimized however according to patient he is still having pain at times.  He did complain last night apparently some stabbing pains in his chest he did not complain of that today he does complain of some arm and leg pain.   He is receiving Roxanol 5 mg every 4 hours apparently this gives him relief but nursing staff feels he could tolerate a stronger dose he certainly does not appear to be lethargic today.  Family medical social history as been reviewed per H&P on 12/17/2011.  Medications have been reviewed per MAR.  Review of systems.  Somewhat limited secondary to dementia but his main complaint is some diffuse pain mainly in his arms and legs at times stomach he does not really complain of chest pain today that she is not complaining of any shortness of breath.  Physical exam- He is afebrile pulse of 82 respirations 18 blood pressure 140/70 O2 saturation is 92% on oxygen 2 L   -In general this is a frail elderly male in no distress lying comfortably in bed.  His skin is warm and dry he does have some oral appearing bruising of right thorax and shoulder area this has improved from when I saw him several weeks ago.  Eyes-pupils. ROM reactive to light visual acuity appears intact.  Oropharynx clear mucous membranes moist.  Chest is clear to auscultation with somewhat poor respiratory effort no rhonchi rales or wheezes no labored breathing.  Heart is irregular irregular rate and rhythm without murmur gallop or rub.  Abdomen is soft somewhat tender to palpation  diffusely this does not appear to be in acute tenderness more a reaction to the invasive maneuver.  Extremities he does have mild lower extremity edema I do not note any deformities he does have some edema of his left arm which is baseline-he does have arthritic changes of his joints diffusely which is not new.--There is generally tenderness to palpation wherever I have palpated especially in the limbs and the abdomen although again the abdominal pain does not appear to be an acute situation  Neurologic is grossly intact speech is clear he is at baseline mental status oriented to self is able to follow simple verbal commands and have a conversation.  Labs.  These have been minimized secondary to comfort care  Most recent labs 12/06/2012.  Metabolic panel was unremarkable.  Assessment and plan.  #1-failure to thrive with history of chronic pain-at this point will increase the dose of Roxanol to 10 mg every 4 hours routine and also every 2 hours when necessary he certainly has tolerated the Roxanol well here apparently has not given him totally adequate pain control however --continue to monitor for any signs of sedation or respiratory depression although certainly no evidence of that today.  [id number]

## 2013-01-28 ENCOUNTER — Other Ambulatory Visit: Payer: Self-pay | Admitting: *Deleted

## 2013-01-28 MED ORDER — MORPHINE SULFATE (CONCENTRATE) 20 MG/ML PO SOLN: ORAL | Status: DC

## 2013-02-01 ENCOUNTER — Other Ambulatory Visit: Payer: Self-pay | Admitting: *Deleted

## 2013-02-01 ENCOUNTER — Non-Acute Institutional Stay (SKILLED_NURSING_FACILITY): Payer: Medicare Other | Admitting: Internal Medicine

## 2013-02-01 DIAGNOSIS — R627 Adult failure to thrive: Secondary | ICD-10-CM

## 2013-02-01 DIAGNOSIS — G894 Chronic pain syndrome: Secondary | ICD-10-CM

## 2013-02-01 DIAGNOSIS — F0280 Dementia in other diseases classified elsewhere without behavioral disturbance: Secondary | ICD-10-CM

## 2013-02-01 MED ORDER — MORPHINE SULFATE (CONCENTRATE) 20 MG/ML PO SOLN: ORAL | Status: DC

## 2013-02-01 NOTE — Progress Notes (Signed)
Patient ID: [name], male   DOB: Oct 03, 1917, 79 y.o.   MRN: [id number] This is an acute visit.  Level of care skilled.  [facility].  Acute visit secondary to pain management.   History of present illness.  Patient is a pleasant elderly resident essentially under comfort care measures with history of failure to thrive and progressive dementia.  His pain medications have been minimized however according to patient he is still having pain at times--this despite increasing his Roxanol to 10 mg every 4 hours routine and every 2 hours when necessary-. He does take when necessary frequently and apparently gives him significant relief without overly sedating him   .  Family medical social history as been reviewed per H&P on 12/17/2011.   Medications have been reviewed per MAR.   Review of systems.  Somewhat limited secondary to dementia but his main complaint is some diffuse pain mainly in his arms and legs at times stomach he does not really complain of chest pain today thatshe is not complaining of any shortness of breath.   Physical exam-  He is afebrile --pulse 88 respirations of 18  -In general this is a frail elderly male in no distress lying comfortably in bed.  His skin is warm and dry he does have some old appearing bruising of right thorax and shoulder area this has improved from when I saw him several weeks ago.  Eyes-pupils. ROM reactive to light visual acuity appears intact.  Oropharynx clear mucous membranes moist.  Chest is clear to auscultation with somewhat poor respiratory effort no rhonchi rales or wheezes no labored breathing.  Heart is irregular irregular rate and rhythm without murmur gallop or rub.  Abdomen is soft somewhat tender to palpation diffusely this does not appear to be in acute tenderness more a reaction to the invasive maneuver--this is quite similar to what I saw last week.  Extremities he does have mild lower extremity edema  I do not note any deformities he does have some edema of his left arm which is actually improved compared to what I saw last week-he does have arthritic changes of his joints diffusely which is not new.--There is generally tenderness to palpation wherever I have palpated especially in the right knee area--left arm with any attempt at range of motion- and the abdomen although again the abdominal pain does not appear to be an acute situation --the right knee does have some arthritic changes some chronic edema here it is not red or warm  Neurologic is grossly intact speech is clear he is at baseline mental status oriented to self is able to follow simple verbal commands and have a conversation.  Labs.  These have been minimized secondary to comfort care  Most recent labs 12/06/2012.  Metabolic panel was unremarkable .  Assessment and plan.  #1-failure to thrive with history of chronic pain-this has been somewhat of a challenge we did increase his Roxanol last week to 10 mg  routine every 4 hours and every 2 hours when necessary he is taking when necessary with frequency-it has not overly sedated -----  Will make the Roxanol routine every 2 hours  --continue to monitor for any signs of sedation or respiratory depression although certainly no evidence of that today--.  [id number]             Not recorded

## 2013-02-02 ENCOUNTER — Other Ambulatory Visit: Payer: Self-pay | Admitting: *Deleted

## 2013-02-02 MED ORDER — MORPHINE SULFATE (CONCENTRATE) 20 MG/ML PO SOLN: ORAL | Status: AC

## 2013-02-10 NOTE — Progress Notes (Signed)
Patient ID: [name], male   DOB: [birth year], 79 y.o.   MRN: [id number]  This is an acute visit.  Facility [facility].  Level of care skilled.  .  Chief complaint-acute visit followup pain management. With history of failure to thrive  History of present illness  Patient is a pleasant elderly resident who has gradually deteriorated clinically with a history of end-stage dementia.  We have been monitoring him for pain and comfort measures.  His family is requesting evaluation today for pain management and evaluation of clinical status.  He is now on comfort measures with desires for no aggressive interventions he does have when necessary Roxanol but unclear exactly if he takes as a whole lot.  Vital signs continued to be stable.  Family medical social history has been reviewed.  Medications have been reviewed again these have been minimized.  Review of systems essentially unobtainable nursing staff and family feels that times he does have some discomfort would benefit from a routine pain medicine.  Physical exam.  Temperature is 90.8 pulse 96 respirations 20 blood pressure 96/60.  In general this is a very frail elderly male in no distress but mental status appears to be declining he is talking minimally.  His skin is warm and dry.  Chest is clear to auscultation but poor respiratory effort.  Heart is regular irregular.  Abdomen is soft does not appear to be tender there are positive bowel sounds.  Oso skeletal he has baseline lower extremity edema this is baseline I do not note any deformities or changes in his extremities.--Alert does appear to have some discomfort when his arms and legs are palpated nursing staff has noted complaints of pain at times as well  Psych-he is speaking some he does recognize his son but is speaking minimally  Labs are not been done any recently secondary to concerns for comfort measures.  Assessment and plan  Failure to thrive.  I did  discuss status with nursing as well as with his son who is very attentive.  His prognosis continues to be very poor and emphasist is on comfort care although  timeframe here is really hard to determine -- I did discuss this with his son who expressed understanding. .  We will change his Roxanol 5 mg  To bid rtn. to make sure he gets regular pain coverage he also has every 4 hours when necessary continue to monitor  [id number].  Marland Kitchen

## 2013-02-16 DEATH — deceased

## 2013-03-27 IMAGING — CR DG SHOULDER 2+V*L*
3 series · 3 of 3 positions shown · non-contrast
Comparison: 07/16/2011.

CLINICAL DATA: Shoulder pain.  Stiffness.

LEFT SHOULDER - 2+ VIEW

[view not recorded (1 of 3)]
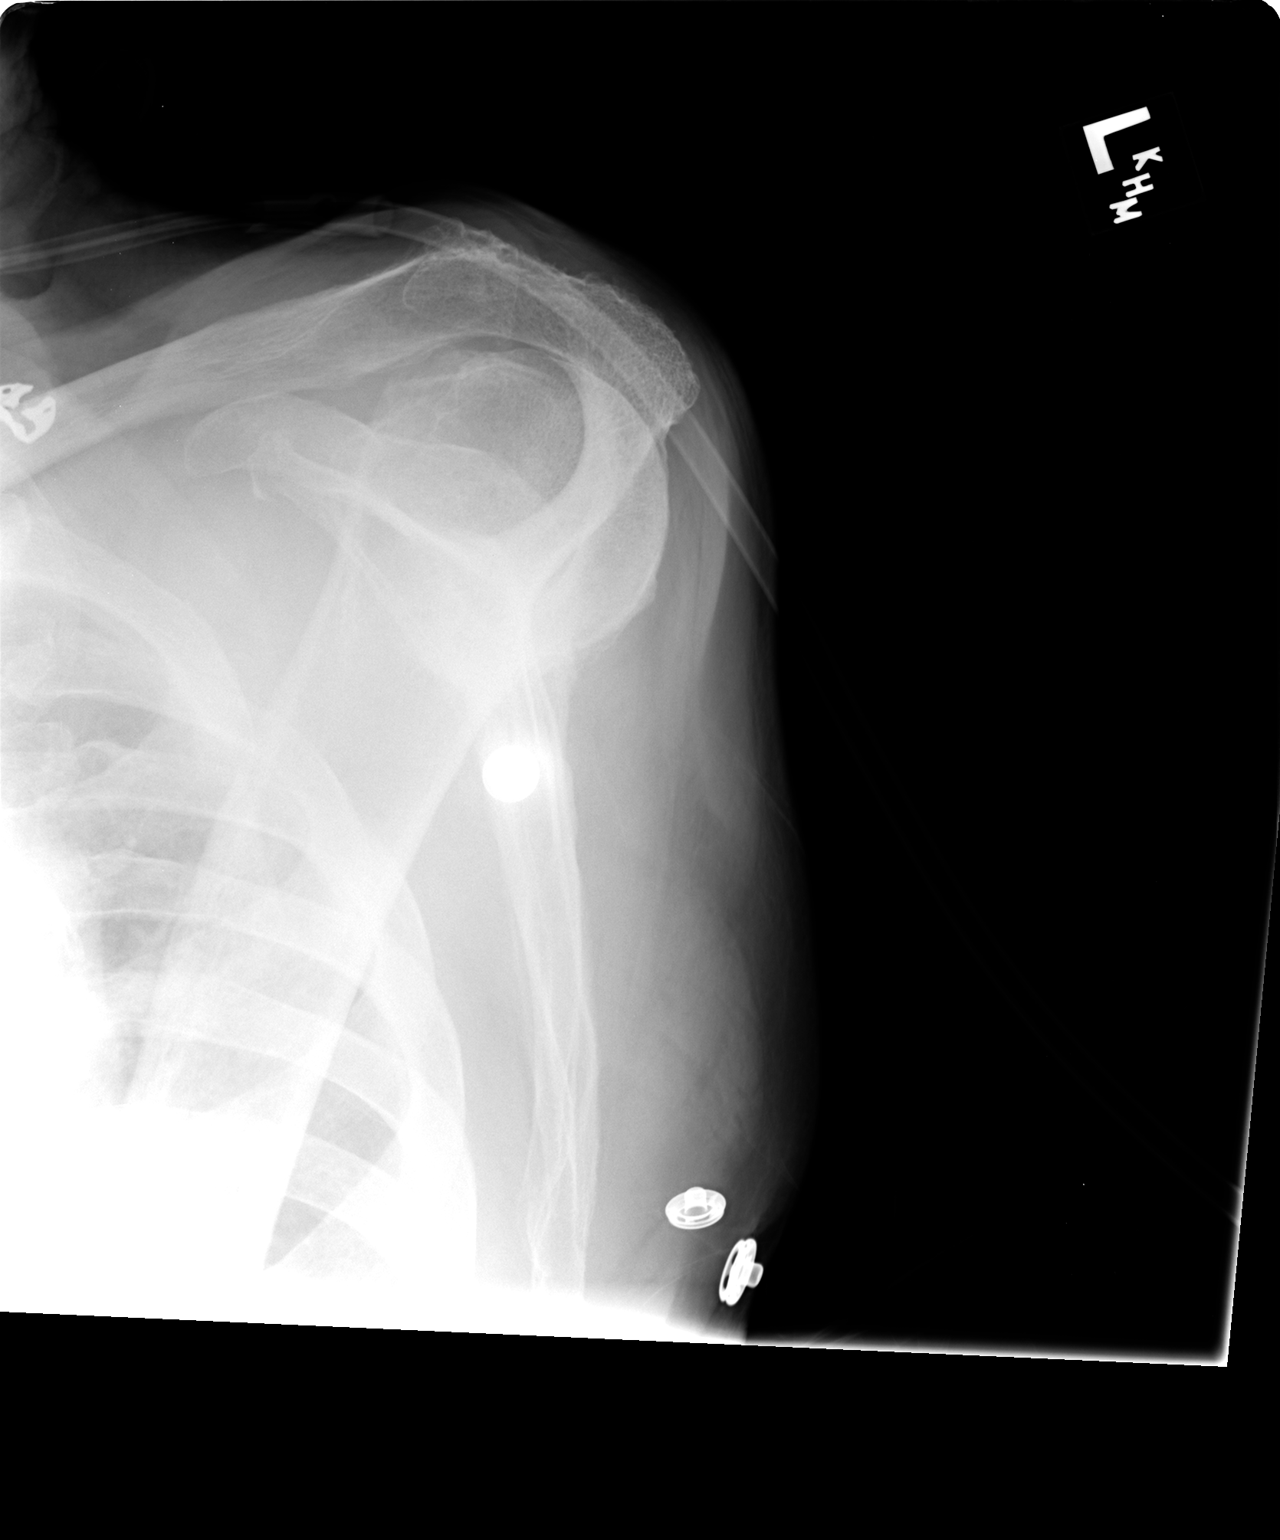

[view not recorded (2 of 3)]
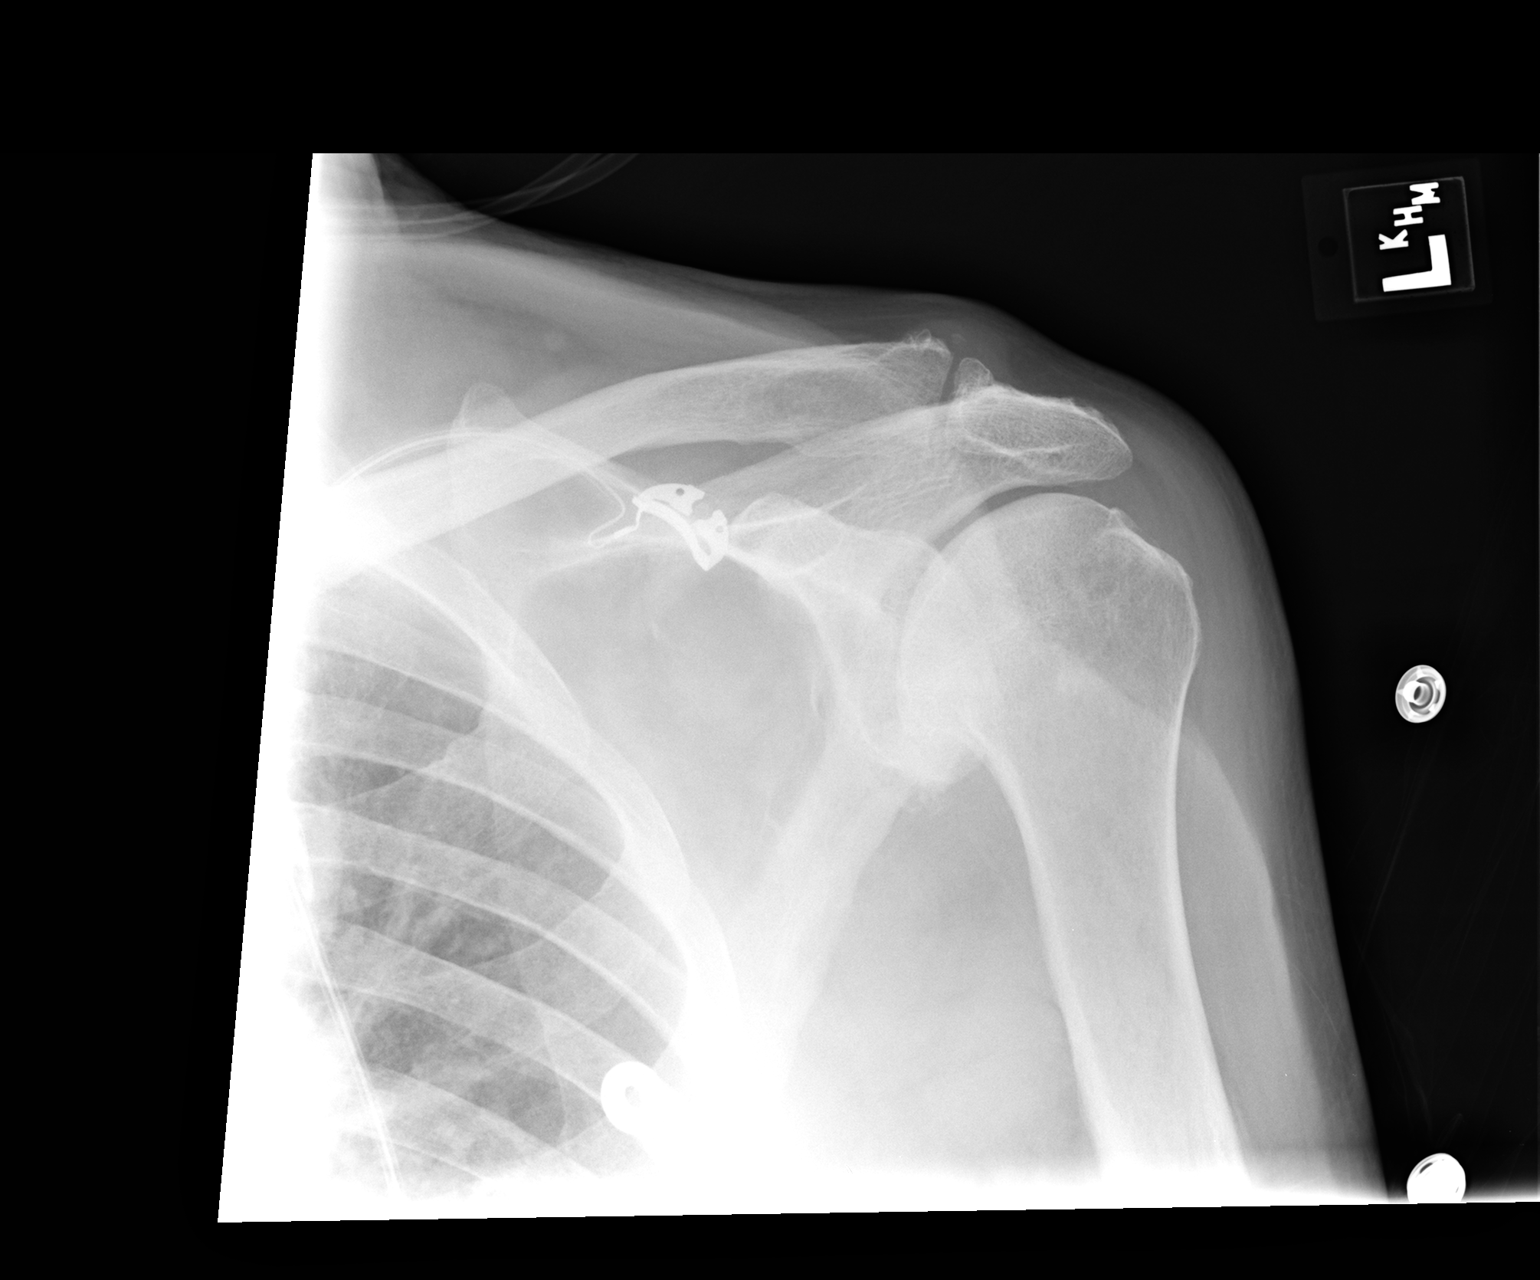

[view not recorded (3 of 3)]
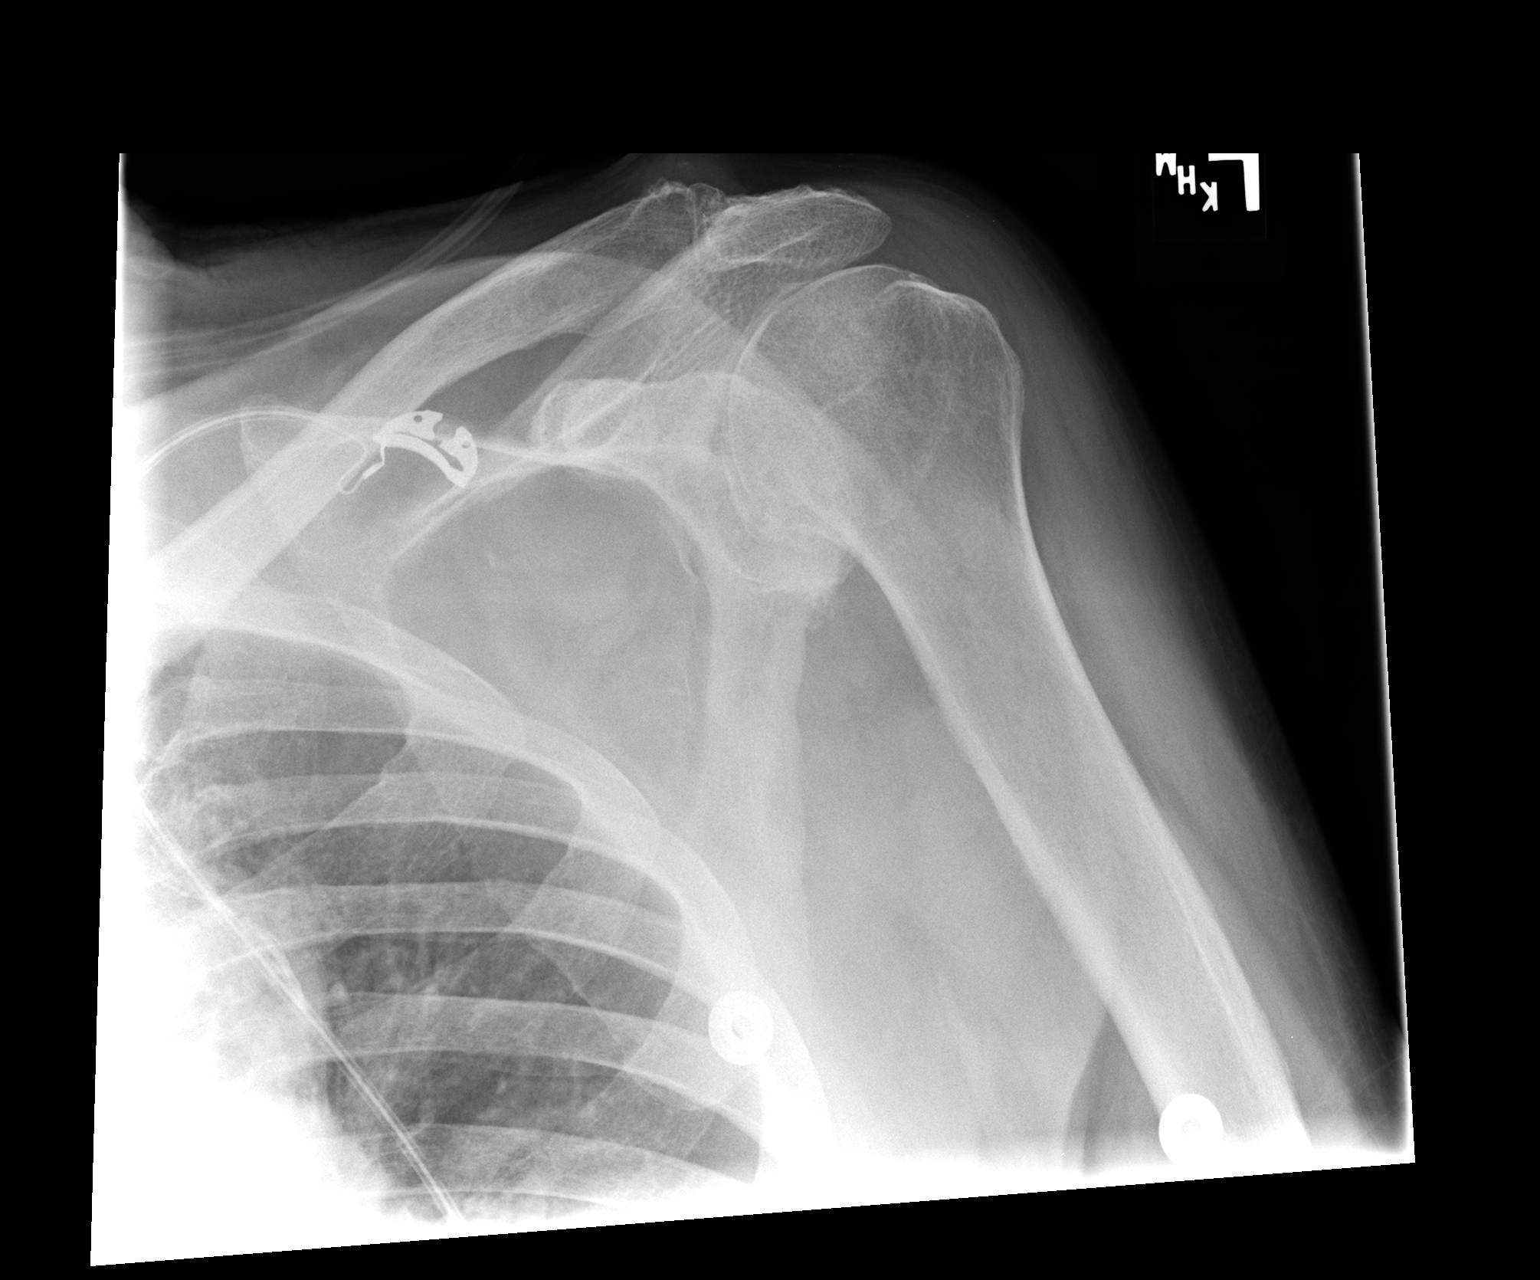

[3 of 3 positions shown; findings below may reference images not displayed]

FINDINGS: Severe glenohumeral and AC joint osteoarthritis is
present.  The osteophytic spurring is present off the humeral head
and glenoid.  Visualized left chest appears within normal limits.
The shoulder is located.
IMPRESSION: Severe glenohumeral and AC joint osteoarthritis.  No acute
abnormality.

## 2013-03-27 IMAGING — CR DG CHEST 1V PORT
1 series · 1 of 1 positions shown · non-contrast
Comparison: 11/20/2011.

CLINICAL DATA: Productive cough.  Chest congestion.

PORTABLE CHEST - 1 VIEW

[view not recorded]
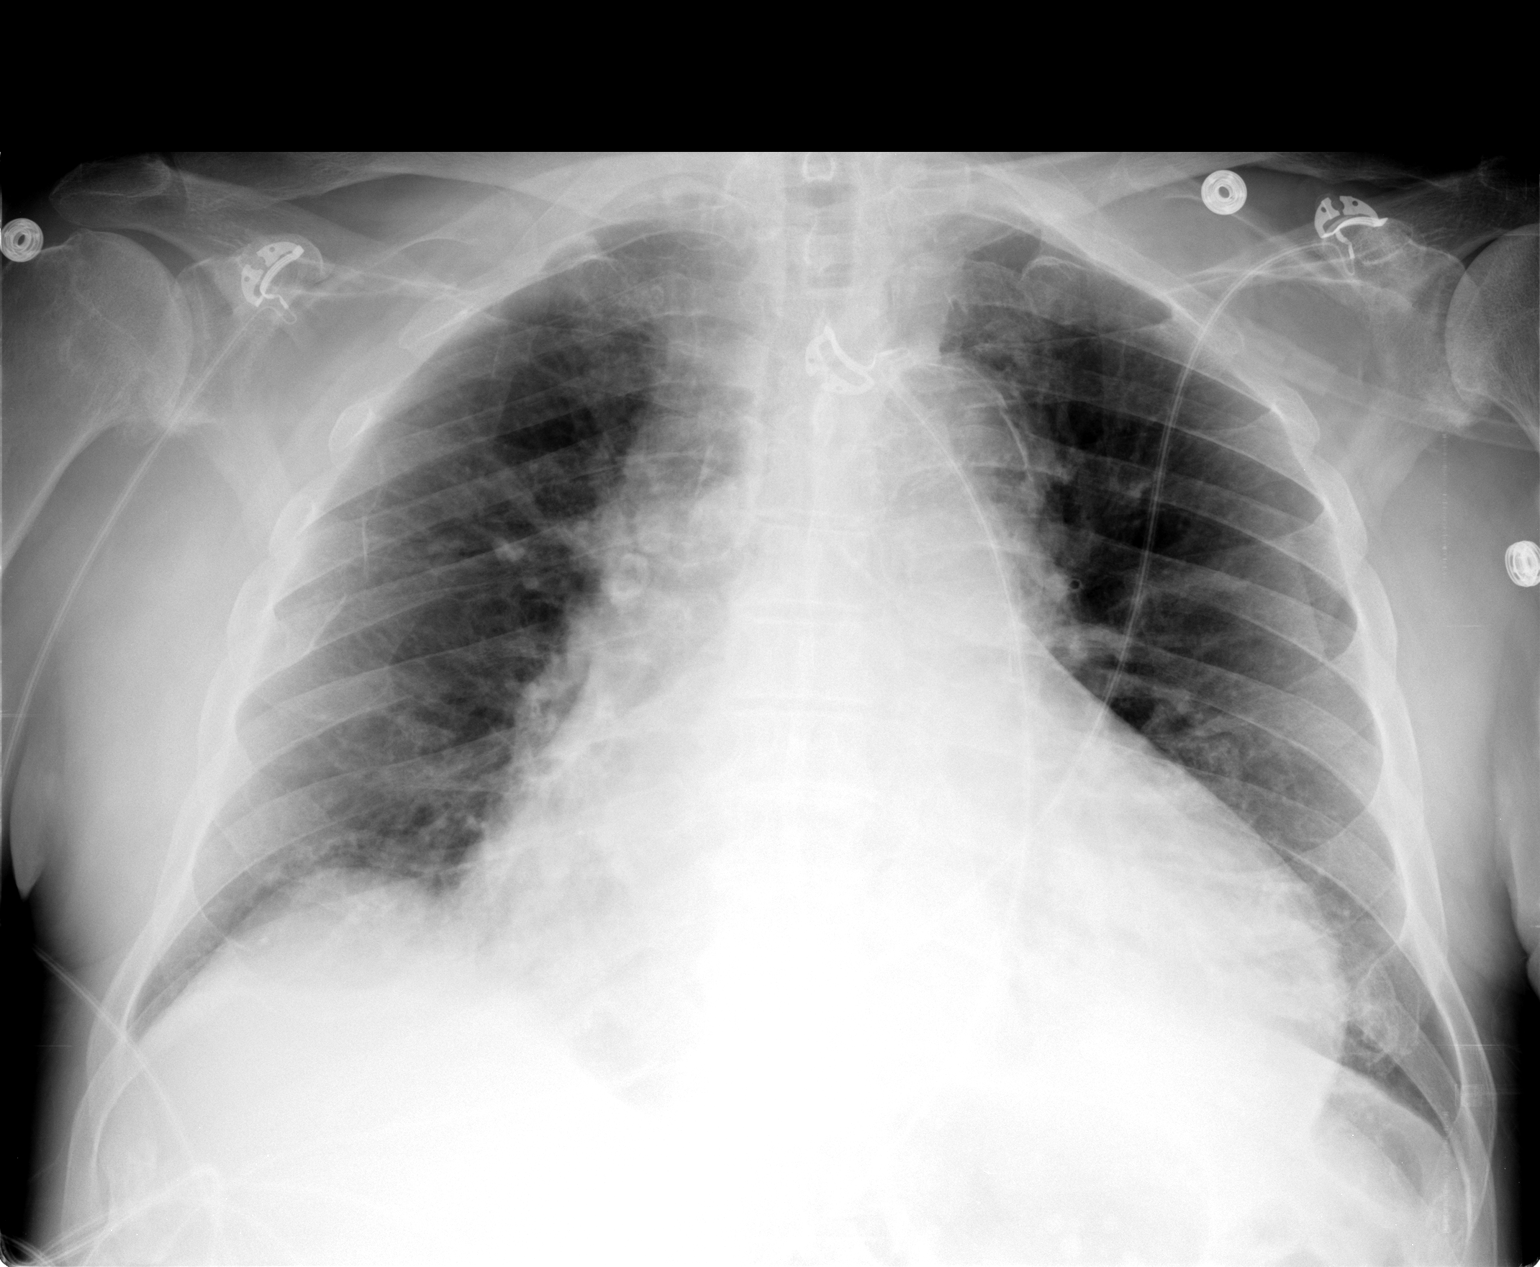

[1 of 1 positions shown; findings below may reference images not displayed]

FINDINGS: Stable enlarged cardiac silhouette.  Improved
inspiration.  Left skin fold.  Minimal diffuse peribronchial
thickening.  Bilateral shoulder degenerative changes.
IMPRESSION: 1.  Minimal bronchitic changes.
2.  Stable cardiomegaly.

## 2013-04-10 IMAGING — CR DG CHEST 1V PORT
1 series · 1 of 1 positions shown · non-contrast
Comparison: CXR 11/23/11.

CLINICAL DATA: Shortness of breath.  Fever.

PORTABLE CHEST - 1 VIEW

[view not recorded]
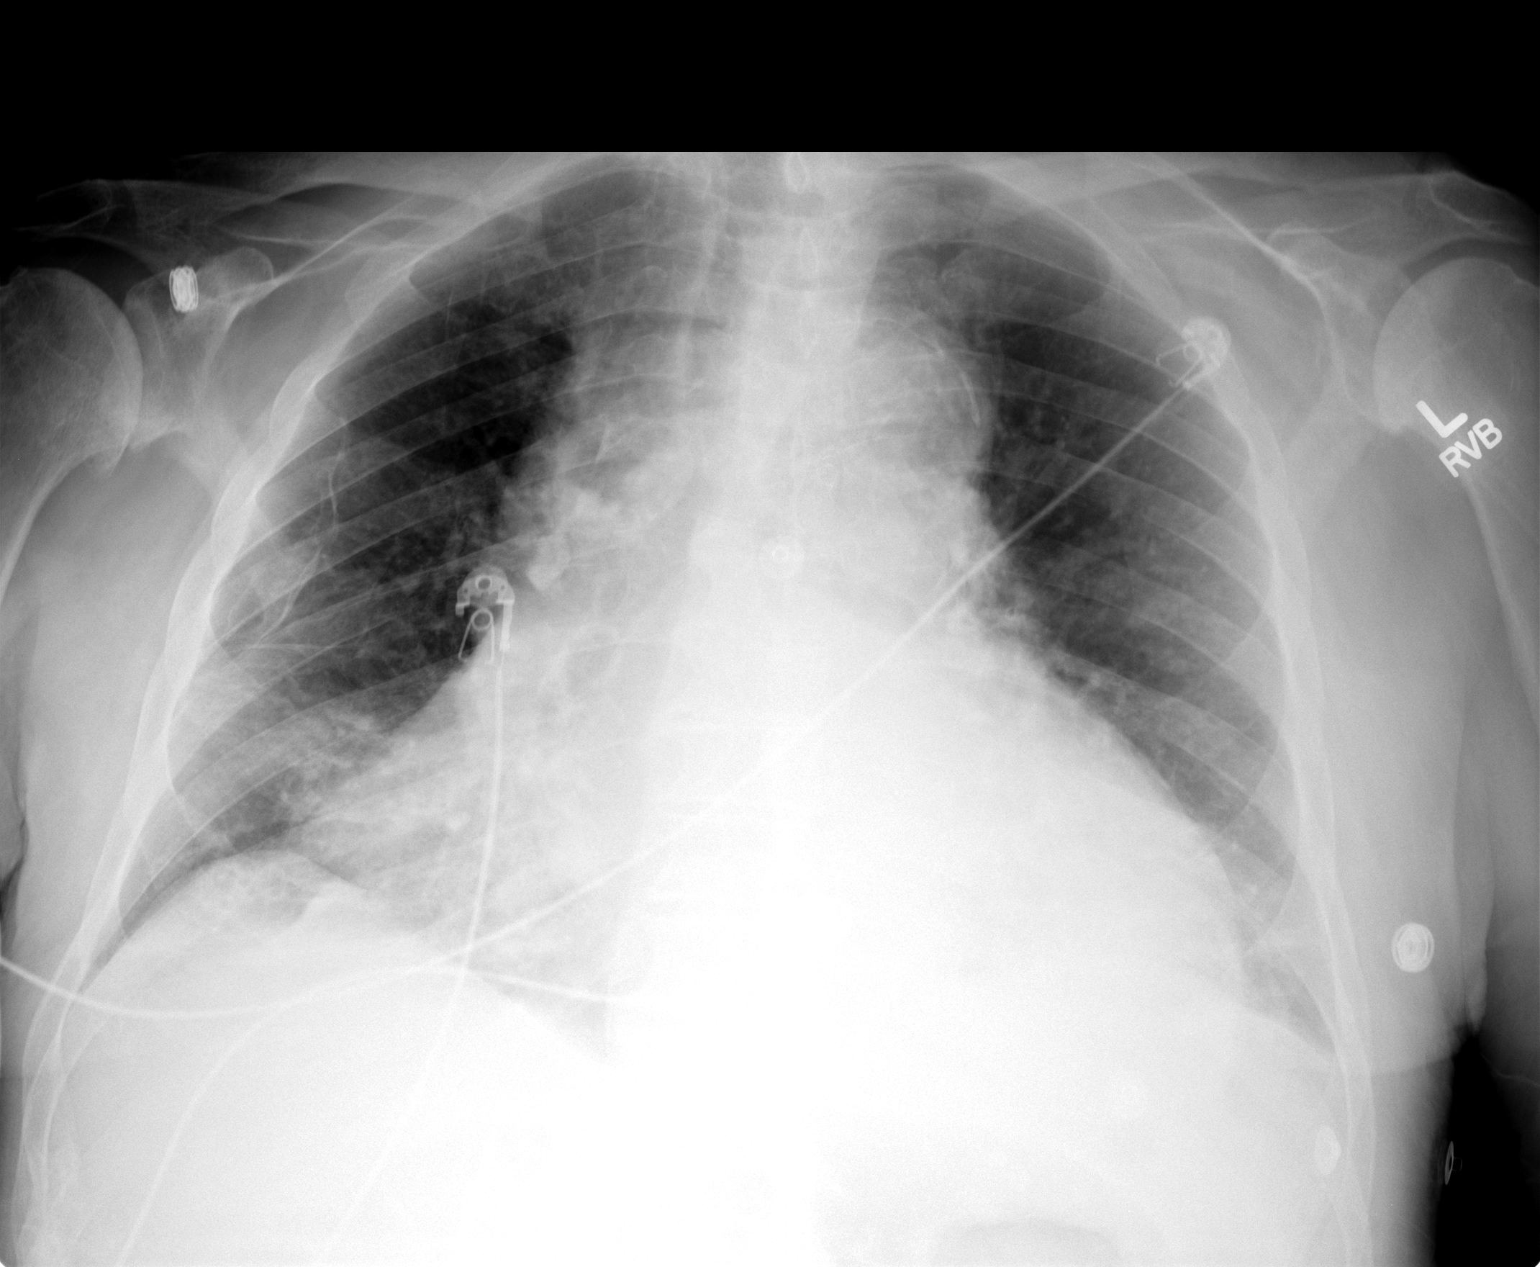

[1 of 1 positions shown; findings below may reference images not displayed]

FINDINGS: Lung volumes are normal.  The irregular interstitial and
airspace opacity in the right lower lobe, concerning for developing
pneumonia or sequelae of aspiration.  Retrocardiac left lower lobe
is poorly visualized, which could suggest atelectasis or an
additional area of airspace consolidation.  No definite pleural
effusions.  Pulmonary vasculature is normal.  Moderate enlargement
of the cardiopericardial silhouette which has a "water bottle"
appearance, which could suggests cardiomegaly and/or the presence
of a pericardial effusion. The patient is rotated to the right on
today's exam, resulting in distortion of the mediastinal contours
and reduced diagnostic sensitivity and specificity for mediastinal
pathology.  Atherosclerosis in the thoracic aorta. The aortic arch
appears dilated, potentially 5 cm in diameter.  Old post-traumatic
deformity of the anterolateral aspect of the right third and fourth
ribs is noted.
IMPRESSION: 1.  Interstitial air space disease in the right lower lobe,
concerning for pneumonia or sequela of aspiration.
2.  Atelectasis and/or consolidation in the left lower lobe as
well.
3.  Moderate enlargement of the cardiopericardial silhouette which
could to be secondary to underlying cardiomegaly and/or the
presence of a pericardial effusion.
4.  Atherosclerosis.  Probable aneurysm of the thoracic aortic arch
which is estimated to measure approximately 5 cm in diameter.  This
could be better evaluated with a contrast enhanced CT scan if
clinically indicated.

## 2013-09-29 NOTE — Telephone Encounter (Signed)
This is entered in error.
